# Patient Record
Sex: Female | Born: 1940 | Race: White | Hispanic: No | Smoking: Former smoker
Health system: Southern US, Community
[De-identification: ages and names within clinical notes are randomized; demographics above are authoritative.]

## PROBLEM LIST (undated history)

## (undated) DIAGNOSIS — E039 Hypothyroidism, unspecified: Secondary | ICD-10-CM

## (undated) DIAGNOSIS — F419 Anxiety disorder, unspecified: Secondary | ICD-10-CM

## (undated) DIAGNOSIS — M8580 Other specified disorders of bone density and structure, unspecified site: Secondary | ICD-10-CM

## (undated) DIAGNOSIS — F329 Major depressive disorder, single episode, unspecified: Secondary | ICD-10-CM

## (undated) DIAGNOSIS — J449 Chronic obstructive pulmonary disease, unspecified: Secondary | ICD-10-CM

## (undated) HISTORY — PX: NASAL SEPTUM SURGERY: SHX37

## (undated) HISTORY — DX: Anxiety disorder, unspecified: F41.9

## (undated) HISTORY — PX: APPENDECTOMY: SHX54

## (undated) HISTORY — DX: Chronic obstructive pulmonary disease, unspecified: J44.9

## (undated) HISTORY — PX: CARPAL TUNNEL RELEASE: SHX101

## (undated) HISTORY — DX: Major depressive disorder, single episode, unspecified: F32.9

## (undated) HISTORY — DX: Hypothyroidism, unspecified: E03.9

## (undated) HISTORY — PX: THYROIDECTOMY: SHX17

## (undated) HISTORY — DX: Other specified disorders of bone density and structure, unspecified site: M85.80

---

## 1998-03-01 ENCOUNTER — Other Ambulatory Visit: Admission: RE | Admit: 1998-03-01 | Discharge: 1998-03-01 | Payer: Self-pay | Admitting: Obstetrics and Gynecology

## 1999-06-21 ENCOUNTER — Other Ambulatory Visit: Admission: RE | Admit: 1999-06-21 | Discharge: 1999-06-21 | Payer: Self-pay | Admitting: Obstetrics and Gynecology

## 2000-06-05 ENCOUNTER — Other Ambulatory Visit: Admission: RE | Admit: 2000-06-05 | Discharge: 2000-06-05 | Payer: Self-pay | Admitting: Obstetrics and Gynecology

## 2000-06-17 ENCOUNTER — Encounter: Admission: RE | Admit: 2000-06-17 | Discharge: 2000-06-17 | Payer: Self-pay | Admitting: Obstetrics and Gynecology

## 2000-06-17 ENCOUNTER — Encounter: Payer: Self-pay | Admitting: Obstetrics and Gynecology

## 2001-06-24 ENCOUNTER — Other Ambulatory Visit: Admission: RE | Admit: 2001-06-24 | Discharge: 2001-06-24 | Payer: Self-pay | Admitting: Family Medicine

## 2001-07-30 LAB — HM COLONOSCOPY

## 2002-07-01 ENCOUNTER — Encounter: Admission: RE | Admit: 2002-07-01 | Discharge: 2002-07-01 | Payer: Self-pay | Admitting: Internal Medicine

## 2002-07-01 ENCOUNTER — Encounter: Payer: Self-pay | Admitting: Internal Medicine

## 2002-07-07 ENCOUNTER — Encounter: Admission: RE | Admit: 2002-07-07 | Discharge: 2002-07-07 | Payer: Self-pay | Admitting: Family Medicine

## 2002-07-07 ENCOUNTER — Encounter: Payer: Self-pay | Admitting: Family Medicine

## 2002-09-01 ENCOUNTER — Encounter: Admission: RE | Admit: 2002-09-01 | Discharge: 2002-09-01 | Payer: Self-pay | Admitting: Internal Medicine

## 2002-09-01 ENCOUNTER — Encounter: Payer: Self-pay | Admitting: Internal Medicine

## 2002-10-13 ENCOUNTER — Other Ambulatory Visit: Admission: RE | Admit: 2002-10-13 | Discharge: 2002-10-13 | Payer: Self-pay | Admitting: Internal Medicine

## 2003-01-05 ENCOUNTER — Ambulatory Visit (HOSPITAL_BASED_OUTPATIENT_CLINIC_OR_DEPARTMENT_OTHER): Admission: RE | Admit: 2003-01-05 | Discharge: 2003-01-05 | Payer: Self-pay | Admitting: Orthopedic Surgery

## 2003-03-10 ENCOUNTER — Ambulatory Visit (HOSPITAL_BASED_OUTPATIENT_CLINIC_OR_DEPARTMENT_OTHER): Admission: RE | Admit: 2003-03-10 | Discharge: 2003-03-10 | Payer: Self-pay | Admitting: Orthopedic Surgery

## 2003-11-18 ENCOUNTER — Other Ambulatory Visit: Admission: RE | Admit: 2003-11-18 | Discharge: 2003-11-18 | Payer: Self-pay | Admitting: Internal Medicine

## 2004-05-14 ENCOUNTER — Ambulatory Visit: Payer: Self-pay | Admitting: Internal Medicine

## 2004-06-11 ENCOUNTER — Ambulatory Visit: Payer: Self-pay | Admitting: Internal Medicine

## 2005-01-15 ENCOUNTER — Ambulatory Visit: Payer: Self-pay | Admitting: Internal Medicine

## 2005-01-15 ENCOUNTER — Other Ambulatory Visit: Admission: RE | Admit: 2005-01-15 | Discharge: 2005-01-15 | Payer: Self-pay | Admitting: Internal Medicine

## 2005-01-22 ENCOUNTER — Ambulatory Visit: Payer: Self-pay | Admitting: Internal Medicine

## 2005-02-19 ENCOUNTER — Ambulatory Visit: Payer: Self-pay | Admitting: Internal Medicine

## 2005-03-14 ENCOUNTER — Ambulatory Visit: Payer: Self-pay | Admitting: Internal Medicine

## 2006-01-29 ENCOUNTER — Other Ambulatory Visit: Admission: RE | Admit: 2006-01-29 | Discharge: 2006-01-29 | Payer: Self-pay | Admitting: Internal Medicine

## 2006-01-29 ENCOUNTER — Ambulatory Visit: Payer: Self-pay | Admitting: Internal Medicine

## 2006-01-29 ENCOUNTER — Encounter: Payer: Self-pay | Admitting: Internal Medicine

## 2006-02-19 ENCOUNTER — Ambulatory Visit: Payer: Self-pay | Admitting: Internal Medicine

## 2006-02-25 ENCOUNTER — Ambulatory Visit: Payer: Self-pay | Admitting: Internal Medicine

## 2006-02-25 LAB — CONVERTED CEMR LAB
Chol/HDL Ratio, serum: 5.5
Cholesterol: 223 mg/dL (ref 0–200)
HCT: 41.1 % (ref 36.0–46.0)
HDL: 40.6 mg/dL (ref >39.0)
Hemoglobin: 13.9 g/dL (ref 12.0–15.0)
LDL DIRECT: 156.8 mg/dL
MCHC: 33.9 g/dL (ref 30.0–36.0)
MCV: 93.5 fL (ref 78.0–100.0)
Platelets: 248 10*3/uL (ref 150–400)
RBC: 4.39 M/uL (ref 3.87–5.11)
RDW: 13.2 % (ref 11.5–14.6)
TSH: 1.08 microintl units/mL (ref 0.35–5.50)
Triglyceride fasting, serum: 83 mg/dL (ref 0–149)
VLDL: 17 mg/dL (ref 0–40)
WBC: 7 10*3/uL (ref 4.5–10.5)

## 2006-10-07 ENCOUNTER — Telehealth: Payer: Self-pay | Admitting: Internal Medicine

## 2007-02-10 ENCOUNTER — Ambulatory Visit: Payer: Self-pay | Admitting: Internal Medicine

## 2007-02-10 ENCOUNTER — Other Ambulatory Visit: Admission: RE | Admit: 2007-02-10 | Discharge: 2007-02-10 | Payer: Self-pay | Admitting: Internal Medicine

## 2007-02-10 ENCOUNTER — Encounter: Payer: Self-pay | Admitting: Internal Medicine

## 2007-02-10 DIAGNOSIS — F419 Anxiety disorder, unspecified: Secondary | ICD-10-CM

## 2007-02-10 DIAGNOSIS — E039 Hypothyroidism, unspecified: Secondary | ICD-10-CM | POA: Insufficient documentation

## 2007-02-10 DIAGNOSIS — J309 Allergic rhinitis, unspecified: Secondary | ICD-10-CM | POA: Insufficient documentation

## 2007-02-10 DIAGNOSIS — F329 Major depressive disorder, single episode, unspecified: Secondary | ICD-10-CM

## 2007-02-10 DIAGNOSIS — Z87448 Personal history of other diseases of urinary system: Secondary | ICD-10-CM

## 2007-02-10 DIAGNOSIS — F32A Depression, unspecified: Secondary | ICD-10-CM

## 2007-02-10 HISTORY — DX: Depression, unspecified: F32.A

## 2007-02-10 HISTORY — DX: Anxiety disorder, unspecified: F41.9

## 2007-02-10 HISTORY — DX: Allergic rhinitis, unspecified: J30.9

## 2007-02-11 LAB — CONVERTED CEMR LAB
BUN: 9 mg/dL (ref 6–23)
Basophils Absolute: 0 10*3/uL (ref 0.0–0.1)
Basophils Relative: 0.5 % (ref 0.0–1.0)
CO2: 28 meq/L (ref 19–32)
Calcium: 9.5 mg/dL (ref 8.4–10.5)
Chloride: 106 meq/L (ref 96–112)
Cholesterol: 216 mg/dL (ref 0–200)
Creatinine, Ser: 0.6 mg/dL (ref 0.4–1.2)
Direct LDL: 157.5 mg/dL
Eosinophils Absolute: 0.6 10*3/uL (ref 0.0–0.6)
Eosinophils Relative: 8.5 % — ABNORMAL HIGH (ref 0.0–5.0)
GFR calc Af Amer: 129 mL/min
GFR calc non Af Amer: 106 mL/min
Glucose, Bld: 89 mg/dL (ref 70–99)
HCT: 38.7 % (ref 36.0–46.0)
HDL: 45.4 mg/dL (ref >39.0)
Hemoglobin: 13.2 g/dL (ref 12.0–15.0)
Lymphocytes Relative: 22.6 % (ref 12.0–46.0)
MCHC: 34.2 g/dL (ref 30.0–36.0)
MCV: 93.6 fL (ref 78.0–100.0)
Monocytes Absolute: 0.5 10*3/uL (ref 0.2–0.7)
Monocytes Relative: 7.2 % (ref 3.0–11.0)
Neutro Abs: 4.1 10*3/uL (ref 1.4–7.7)
Neutrophils Relative %: 61.2 % (ref 43.0–77.0)
Platelets: 247 10*3/uL (ref 150–400)
Potassium: 4.3 meq/L (ref 3.5–5.1)
RBC: 4.14 M/uL (ref 3.87–5.11)
RDW: 13.6 % (ref 11.5–14.6)
Sodium: 141 meq/L (ref 135–145)
TSH: 2.38 microintl units/mL (ref 0.35–5.50)
Total CHOL/HDL Ratio: 4.8
Triglycerides: 68 mg/dL (ref 0–149)
VLDL: 14 mg/dL (ref 0–40)
WBC: 6.7 10*3/uL (ref 4.5–10.5)

## 2007-02-13 ENCOUNTER — Encounter (INDEPENDENT_AMBULATORY_CARE_PROVIDER_SITE_OTHER): Payer: Self-pay | Admitting: *Deleted

## 2007-02-18 ENCOUNTER — Encounter: Payer: Self-pay | Admitting: Internal Medicine

## 2007-02-27 ENCOUNTER — Ambulatory Visit: Payer: Self-pay | Admitting: Internal Medicine

## 2007-02-27 ENCOUNTER — Encounter: Payer: Self-pay | Admitting: Internal Medicine

## 2007-05-22 ENCOUNTER — Telehealth (INDEPENDENT_AMBULATORY_CARE_PROVIDER_SITE_OTHER): Payer: Self-pay | Admitting: *Deleted

## 2007-08-24 ENCOUNTER — Telehealth: Payer: Self-pay | Admitting: Internal Medicine

## 2008-02-17 ENCOUNTER — Other Ambulatory Visit: Admission: RE | Admit: 2008-02-17 | Discharge: 2008-02-17 | Payer: Self-pay | Admitting: Internal Medicine

## 2008-02-17 ENCOUNTER — Encounter: Payer: Self-pay | Admitting: Internal Medicine

## 2008-02-17 ENCOUNTER — Ambulatory Visit: Payer: Self-pay | Admitting: Internal Medicine

## 2008-02-17 DIAGNOSIS — M899 Disorder of bone, unspecified: Secondary | ICD-10-CM | POA: Insufficient documentation

## 2008-02-17 DIAGNOSIS — M949 Disorder of cartilage, unspecified: Secondary | ICD-10-CM

## 2008-02-17 LAB — CONVERTED CEMR LAB
Bilirubin Urine: NEGATIVE
Blood in Urine, dipstick: NEGATIVE
Glucose, Urine, Semiquant: NEGATIVE
Ketones, urine, test strip: NEGATIVE
Nitrite: NEGATIVE
Protein, U semiquant: NEGATIVE
Specific Gravity, Urine: 1.01
Urobilinogen, UA: 0.2
pH: 7

## 2008-02-18 ENCOUNTER — Encounter: Payer: Self-pay | Admitting: Internal Medicine

## 2008-02-22 ENCOUNTER — Telehealth: Payer: Self-pay | Admitting: Internal Medicine

## 2008-02-22 LAB — CONVERTED CEMR LAB
CO2: 29 meq/L (ref 19–32)
Calcium: 9.3 mg/dL (ref 8.4–10.5)
Cholesterol: 241 mg/dL (ref 0–200)
Direct LDL: 162.8 mg/dL
Glucose, Bld: 86 mg/dL (ref 70–99)
Sodium: 140 meq/L (ref 135–145)
Triglycerides: 78 mg/dL (ref 0–149)
Vit D, 1,25-Dihydroxy: 31 (ref 30–89)

## 2008-03-17 ENCOUNTER — Telehealth (INDEPENDENT_AMBULATORY_CARE_PROVIDER_SITE_OTHER): Payer: Self-pay | Admitting: *Deleted

## 2008-03-30 ENCOUNTER — Encounter: Payer: Self-pay | Admitting: Internal Medicine

## 2008-03-30 LAB — HM MAMMOGRAPHY: HM Mammogram: NORMAL

## 2008-04-25 ENCOUNTER — Telehealth (INDEPENDENT_AMBULATORY_CARE_PROVIDER_SITE_OTHER): Payer: Self-pay | Admitting: *Deleted

## 2008-04-25 ENCOUNTER — Encounter (INDEPENDENT_AMBULATORY_CARE_PROVIDER_SITE_OTHER): Payer: Self-pay | Admitting: *Deleted

## 2008-09-19 ENCOUNTER — Telehealth (INDEPENDENT_AMBULATORY_CARE_PROVIDER_SITE_OTHER): Payer: Self-pay | Admitting: *Deleted

## 2009-02-17 ENCOUNTER — Ambulatory Visit: Payer: Self-pay | Admitting: Internal Medicine

## 2009-02-17 DIAGNOSIS — E785 Hyperlipidemia, unspecified: Secondary | ICD-10-CM

## 2009-02-17 DIAGNOSIS — F17200 Nicotine dependence, unspecified, uncomplicated: Secondary | ICD-10-CM | POA: Insufficient documentation

## 2009-02-17 HISTORY — DX: Hyperlipidemia, unspecified: E78.5

## 2009-02-20 ENCOUNTER — Encounter (INDEPENDENT_AMBULATORY_CARE_PROVIDER_SITE_OTHER): Payer: Self-pay | Admitting: *Deleted

## 2009-02-22 ENCOUNTER — Telehealth (INDEPENDENT_AMBULATORY_CARE_PROVIDER_SITE_OTHER): Payer: Self-pay | Admitting: *Deleted

## 2009-02-22 LAB — CONVERTED CEMR LAB
Basophils Relative: 1 % (ref 0–1)
CO2: 27 meq/L (ref 19–32)
Chloride: 106 meq/L (ref 96–112)
Glucose, Bld: 88 mg/dL (ref 70–99)
Hemoglobin: 13.3 g/dL (ref 12.0–15.0)
LDL Cholesterol: 148 mg/dL — ABNORMAL HIGH (ref 0–99)
Lymphocytes Relative: 33 % (ref 12–46)
Lymphs Abs: 2.3 10*3/uL (ref 0.7–4.0)
MCHC: 32.2 g/dL (ref 30.0–36.0)
Monocytes Absolute: 0.5 10*3/uL (ref 0.1–1.0)
Monocytes Relative: 7 % (ref 3–12)
Neutro Abs: 3.9 10*3/uL (ref 1.7–7.7)
Potassium: 4 meq/L (ref 3.5–5.3)
RBC: 4.5 M/uL (ref 3.87–5.11)
Sodium: 141 meq/L (ref 135–145)
TSH: 0.649 microintl units/mL (ref 0.350–4.500)
Total CHOL/HDL Ratio: 4.7
VLDL: 19 mg/dL (ref 0–40)
Vit D, 25-Hydroxy: 29 ng/mL — ABNORMAL LOW (ref 30–89)

## 2009-02-28 ENCOUNTER — Ambulatory Visit: Payer: Self-pay | Admitting: Internal Medicine

## 2009-03-02 ENCOUNTER — Encounter (INDEPENDENT_AMBULATORY_CARE_PROVIDER_SITE_OTHER): Payer: Self-pay | Admitting: *Deleted

## 2009-03-02 ENCOUNTER — Encounter: Payer: Self-pay | Admitting: Internal Medicine

## 2009-03-02 ENCOUNTER — Ambulatory Visit: Payer: Self-pay | Admitting: Internal Medicine

## 2009-03-20 ENCOUNTER — Encounter (INDEPENDENT_AMBULATORY_CARE_PROVIDER_SITE_OTHER): Payer: Self-pay | Admitting: *Deleted

## 2009-04-12 ENCOUNTER — Ambulatory Visit: Payer: Self-pay | Admitting: Internal Medicine

## 2009-04-25 ENCOUNTER — Encounter: Payer: Self-pay | Admitting: Internal Medicine

## 2009-05-26 ENCOUNTER — Ambulatory Visit: Payer: Self-pay | Admitting: Internal Medicine

## 2009-06-14 ENCOUNTER — Telehealth (INDEPENDENT_AMBULATORY_CARE_PROVIDER_SITE_OTHER): Payer: Self-pay | Admitting: *Deleted

## 2009-08-09 ENCOUNTER — Encounter (INDEPENDENT_AMBULATORY_CARE_PROVIDER_SITE_OTHER): Payer: Self-pay | Admitting: *Deleted

## 2009-08-10 ENCOUNTER — Telehealth: Payer: Self-pay | Admitting: Internal Medicine

## 2009-08-10 ENCOUNTER — Ambulatory Visit: Payer: Self-pay | Admitting: Family Medicine

## 2009-08-15 ENCOUNTER — Telehealth (INDEPENDENT_AMBULATORY_CARE_PROVIDER_SITE_OTHER): Payer: Self-pay | Admitting: *Deleted

## 2009-08-21 ENCOUNTER — Ambulatory Visit: Payer: Self-pay | Admitting: Internal Medicine

## 2009-08-21 DIAGNOSIS — L821 Other seborrheic keratosis: Secondary | ICD-10-CM | POA: Insufficient documentation

## 2009-08-29 ENCOUNTER — Ambulatory Visit: Payer: Self-pay | Admitting: Internal Medicine

## 2009-08-31 ENCOUNTER — Telehealth (INDEPENDENT_AMBULATORY_CARE_PROVIDER_SITE_OTHER): Payer: Self-pay | Admitting: *Deleted

## 2009-09-06 ENCOUNTER — Ambulatory Visit: Payer: Self-pay | Admitting: Internal Medicine

## 2009-09-06 DIAGNOSIS — R0609 Other forms of dyspnea: Secondary | ICD-10-CM | POA: Insufficient documentation

## 2009-09-06 DIAGNOSIS — R0989 Other specified symptoms and signs involving the circulatory and respiratory systems: Secondary | ICD-10-CM

## 2009-09-07 ENCOUNTER — Encounter: Payer: Self-pay | Admitting: Internal Medicine

## 2009-12-12 ENCOUNTER — Ambulatory Visit: Payer: Self-pay | Admitting: Internal Medicine

## 2009-12-19 ENCOUNTER — Ambulatory Visit: Payer: Self-pay | Admitting: Internal Medicine

## 2010-01-17 ENCOUNTER — Ambulatory Visit: Payer: Self-pay | Admitting: Internal Medicine

## 2010-05-09 ENCOUNTER — Encounter: Payer: Self-pay | Admitting: Internal Medicine

## 2010-05-31 NOTE — Progress Notes (Signed)
  Phone Note Call from Patient Call back at Home Phone [phone]   Details for Reason: PATIENT LEFT MESSAGE ON MACHINE  -- PT STATES THAT SHE THINKS SHE NEEDS A CXR  Summary of Call: left message on machine for pt to return call Meagan Holmes  August 15, 2009 11:44 AM  ov scheduled  Meagan Holmes  August 16, 2009 1:41 PM

## 2010-05-31 NOTE — Assessment & Plan Note (Signed)
Summary: flu shot//ph   Nurse Visit   Allergies: 1)  ! Prednisone  Orders Added: 1)  Flu Vaccine 59yrs + MEDICARE PATIENTS [Q2039] 2)  Administration Flu vaccine - MCR [G0008]   Flu Vaccine Consent Questions     Do you have a history of severe allergic reactions to this vaccine? no    Any prior history of allergic reactions to egg and/or gelatin? no    Do you have a sensitivity to the preservative Thimersol? no    Do you have a past history of Guillan-Barre Syndrome? no    Do you currently have an acute febrile illness? no    Have you ever had a severe reaction to latex? no    Vaccine information given and explained to patient? yes    Are you currently pregnant? no    Lot Number:[id number]   Exp Date:10/27/2010   Site Given  Right Deltoid IM

## 2010-05-31 NOTE — Miscellaneous (Signed)
Summary: Orders Update pft charges   Clinical Lists Changes  Problems: Added new problem of DYSPNEA ON EXERTION (ICD-786.09) Orders: Added new Service order of Carbon Monoxide diffusing w/capacity [phone]) - Signed Added new Service order of Lung Volumes (60454) - Signed Added new Service order of Spirometry (Pre & Post) [phone]) - Signed

## 2010-05-31 NOTE — Assessment & Plan Note (Signed)
Summary: rov/swh   Vital Signs:  Patient profile:   70 year old female Height:      63.75 inches Weight:      141 pounds BMI:     24.48 Pulse rate:   84 / minute BP sitting:   100 / 60  Vitals Entered By: Shary Decamp (August 21, 2009 4:11 PM) CC: rov, has ? about vit d, D was low 10/10 & she took 12 weeks of 50,000   History of Present Illness: ROV --osteopenia: h/o of low vitamin D, status-post ergocalciferol. good compliance with Actonel --tobacco abuse wonders about the need of a chest x-ray or  PFTs  we talk about doing PFTs the last time I saw her with bronchitis . She continued to smoke about one pack a day, she was able to quit for 6 weeks w/ the use of nicotine patches  but she had to stop the nicotine patch due to side effects ( weird dreams) --has a mole in the back, not sure if it  has growth in size lately  --Hypothyroidism, needs labs     Current Medications (verified): 1)  Calcium 500 Mg Tabs (Calcium) 2)  Synthroid 88 Mcg Tabs (Levothyroxine Sodium) .[name].. 1 By Mouth Once Daily 3)  Aspirin  Tabs (Aspirin Tabs) 4)  Ativan 0.5 Mg  Tabs (Lorazepam) .... One By Mouth Every 6 Hours As Needed For Anxiety and Insomnia 5)  Actonel 150 Mg Tabs (Risedronate Sodium) .[name].. 1 By Mouth Q Month 6)  Vitamin C 7)  Ventolin Hfa 108 (90 Base) Mcg/act Aers (Albuterol Sulfate) .... Two Puffs Every 6 Hours As Needed For Cough and Wheezing  Allergies (verified): 1)  ! Prednisone  Past History:  Past Medical History: Allergic rhinitis-a lot better after septoplasty (remote) Hypothyroidism Depression Osteopenia HEMATURIA, MICROSCOPIC, HX OF -- 2004:  (-) CT abd--pelvis (-) cystoscopy  Review of Systems General:  other than the HPI concerns feels well .  Physical Exam  General:  alert and well-developed.   Lungs:  normal respiratory effort and no intercostal retractions.  mild large airway congestion, no crackles or wheezing Heart:  normal rate, regular rhythm, no murmur,  and no gallop.   Extremities:  no edema Skin:  at the mid back, left side has a 1 cm oval skin lesion consistent with seborrheic Keratosis    Impression & Recommendations:  Problem # 1:  TOBACCO ABUSE (ICD-305.1)  consulate about tobacco chest x-ray PFTs  Orders: T-2 View CXR (71020TC) Pulmonary Referral (Pulmonary)  Problem # 2:  OSTEOPENIA (ICD-733.90) status-post ergocalciferol recommend calcium and vitamin D daily labs Her updated medication list for this problem includes:    Calcium 500 Mg Tabs (Calcium)    Actonel 150 Mg Tabs (Risedronate sodium) .[name]... 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 1 by mouth once daily  Labs Reviewed: TSH: 0.649 (02/17/2009)    Chol: 212 (02/17/2009)   HDL: 45 (02/17/2009)   LDL: 148 (02/17/2009)   TG: 97 (02/17/2009)  Complete Medication List: 1)  Calcium 500 Mg Tabs (Calcium) 2)  Synthroid 88 Mcg Tabs (Levothyroxine sodium) .[name].. 1 by mouth once daily 3)  Aspirin Tabs (Aspirin tabs) 4)  Ativan 0.5  Mg Tabs (Lorazepam) .... One by mouth every 6 hours as needed for anxiety and insomnia 5)  Actonel 150 Mg Tabs (Risedronate sodium) .[name].. 1 by mouth q month 6)  Vitamin C  7)  Ventolin Hfa 108 (90 Base) Mcg/act Aers (Albuterol sulfate) .... Two puffs every 6 hours as needed for cough and wheezing  Patient Instructions: 1)  please come back at your convenience: 2)  TSH--- dx  hypothyroidism 3)  vitamin D ----dx osteopenia

## 2010-05-31 NOTE — Progress Notes (Signed)
Summary: cxr results, d/w pt  Phone Note Outgoing Call   Details for Reason: CXR RESULTS: advise patient: has some scarring and emphysema , likely from tobacco Signed by [facility] E. Paz MD on 08/30/2009 at 5:57 PM Summary of Call: left message on machine for pt to return call Meagan Holmes  Aug 31, 2009 9:12 AM discussed with pt Meagan Holmes  Sep 02, 2009 9:31 AM

## 2010-05-31 NOTE — Assessment & Plan Note (Signed)
Summary: MOLE REMOVAL/KN   Vital Signs:  Patient profile:   70 year old female Weight:      140.50 pounds Pulse rate:   73 / minute Pulse rhythm:   regular BP sitting:   130 / 78  (left arm) Cuff size:   regular  Vitals Entered By: Army Fossa CMA (December 12, 2009 2:15 PM) CC: mole removal off back   History of Present Illness: he has a skin lesion in the left mid to upper back. We agreed to monitor it for changes but she is concerned because she can't see it and she does not think her husband is able to check the lesion well. Request excision. she also has a similar lesion in the left leg  Current Medications (verified): 1)  Calcium 500 Mg Tabs (Calcium) 2)  Synthroid 88 Mcg Tabs (Levothyroxine Sodium) .[name].. 1 By Mouth Once Daily 3)  Aspirin  Tabs (Aspirin Tabs) 4)  Ativan 0.5 Mg  Tabs (Lorazepam) .... One By Mouth Every 6 Hours As Needed For Anxiety and Insomnia 5)  Actonel 150 Mg Tabs (Risedronate Sodium) .[name].. 1 By Mouth Q Month 6)  Ventolin Hfa 108 (90 Base) Mcg/act Aers (Albuterol Sulfate) .... Two Puffs Every 6 Hours As Needed For Cough and Wheezing  Allergies: 1)  ! Prednisone  Physical Exam  General:  alert and well-developed.   Skin:  at the mid back, left side has a 1 cm oval skin lesion consistent with seborrheic Keratosis  similar but less dark lesion at  left inner thigh close to the groin.   Impression & Recommendations:  Problem # 1:  procedure note in a sterile fashion and under local anesthesia with lidocaine 1% without approximately 2 cc we did A. elliptical 2 cm incision and remove the skin lesion from the left mid to upper back. 3 stitches Patient instructed to come back in one week for stitch removal pathology sent Will call if redness, discharge or severe bleeding develops  Problem # 2:  skin lesion and the left leg likely seborrheic or ptosis, will recommend observation for now since the patient is able to check it herself  Complete  Medication List: 1)  Calcium 500 Mg Tabs (Calcium) 2)  Synthroid 88 Mcg Tabs (Levothyroxine sodium) .[name].. 1 by mouth once daily 3)  Aspirin Tabs (Aspirin tabs) 4)  Ativan 0.5 Mg Tabs (Lorazepam) .... One by mouth every 6 hours as needed for anxiety and insomnia 5)  Actonel 150 Mg Tabs (Risedronate sodium) .[name].. 1 by mouth q month 6)  Ventolin Hfa 108 (90 Base) Mcg/act Aers (Albuterol sulfate) .... Two puffs every 6 hours as needed for cough and wheezing  Other Orders: Excise lesion (TAL) 0.6 - 1.0 (11401)

## 2010-05-31 NOTE — Letter (Signed)
Summary: [facility] No Show Letter  [facility] [place]  [address] [place], [place] [address]   Phone: [phone]  Fax: [phone]    08/09/2009 MRN: [id number]  [facility] [address] [place], [place]  [address]   Dear Ms. Jean Rosenthal,   Our records indicate that you missed your scheduled appointment with Dr. Drue Novel on 08/09/09.  Please contact this office to reschedule your appointment as soon as possible.  It is important that you keep your scheduled appointments with your physician, so we can provide you the best care possible.  Please be advised that there may be a charge for "no show" appointments.    Sincerely,   [facility]

## 2010-05-31 NOTE — Assessment & Plan Note (Signed)
Summary: bronchtisis/kdc   Vital Signs:  Patient profile:   70 year old female Height:      63.75 inches Weight:      141.2 pounds Temp:     98.3 degrees F BP sitting:   112 / 60  Vitals Entered By: Shary Decamp (May 26, 2009 4:10 PM) CC: acute Comments  - nasal, chest congestion, cough Shary Decamp  May 26, 2009 4:22 PM    History of Present Illness: she was seen at the mid December with bronchitis, she got completely well she got another "cold" a week ago: Developed nose and throat congestion, cough  Current Medications (verified): 1)  Calcium 500 Mg Tabs (Calcium) 2)  Synthroid 88 Mcg Tabs (Levothyroxine Sodium) .[name].. 1 By Mouth Once Daily 3)  Aspirin  Tabs (Aspirin Tabs) 4)  Ativan 0.5 Mg  Tabs (Lorazepam) .... One By Mouth Every 6 Hours As Needed For Anxiety and Insomnia 5)  Actonel 150 Mg Tabs (Risedronate Sodium) .[name].. 1 By Mouth Q Month 6)  Vitamin C  Allergies (verified): 1)  ! Prednisone  Past History:  Past Medical History: Reviewed history from 02/17/2009 and no changes required. Allergic rhinitis-a lot better after septoplasty (remote) Hypothyroidism Depression Osteopenia HEMATURIA, MICROSCOPIC, HX OF -- 2004:  (-) CT abd--pelvis (-) cystoscopy  Past Surgical History: Reviewed history from 02/10/2007 and no changes required. Appendectomy Thyroidectomy CTS surgery Deviated septum surgery  Social History: Reviewed history from 02/17/2009 and no changes required. Married lost  son 2008 (to drugs) 3 daughters Retired smokes 1ppd no ETOH Regular exercise-yes Drug use-no Current Smoker  Review of Systems       denies fever some wheezing  Physical Exam  General:  alert and well-developed.   Ears:  R ear normal and L ear normal.   Nose:  not congested Mouth:  not redness or discharge Lungs:  normal respiratory effort and no intercostal retractions.  rhonchi bilaterally with cough, slightly prolonged expiratory phase without  clear-cut wheezing   Impression & Recommendations:  Problem # 1:  BRONCHITIS- ACUTE (ICD-466.0) bronchitis and some degree of reactive airway disease patient is a heavy smoker see instructions  will order PFTs once she is better   Her updated medication list for this problem includes:    Doxycycline Hyclate 100 Mg Caps (Doxycycline hyclate) .[name]... 1 by mouth twice a day for one week    Ventolin Hfa 108 (90 Base) Mcg/act Aers (Albuterol sulfate) .[name]..[name] Two puffs every 6 hours as needed for cough and wheezing  Complete Medication List: 1)  Calcium 500 Mg Tabs (Calcium) 2)  Synthroid 88 Mcg Tabs (Levothyroxine sodium) .[name].. 1 by mouth once daily 3)  Aspirin Tabs (Aspirin tabs) 4)  Ativan 0.5 Mg Tabs (Lorazepam) .... One by mouth every 6 hours as needed for anxiety and insomnia 5)  Actonel 150 Mg Tabs (Risedronate sodium) .[name].. 1 by mouth q month 6)  Vitamin C  7)  Doxycycline Hyclate 100 Mg Caps (Doxycycline hyclate) .[name].. 1 by mouth twice a day for one week 8)  Ventolin Hfa 108 (90 Base) Mcg/act Aers (Albuterol sulfate) .... Two puffs every 6 hours as needed for cough and wheezing  Patient Instructions: 1)  rest , fluids, tyelnol, mucinex DM for cough 2)  for wheezing and persistent cough use ventolin 3)  doxycycline, antibiotic for one week 4)  call if no better in few days  or if worse  5)  Please schedule a follow-up appointment in 1 month.  Prescriptions: VENTOLIN HFA 108 (  90 BASE) MCG/ACT AERS (ALBUTEROL SULFATE) two puffs every 6 hours as needed for cough and wheezing  #1 x 1   Entered and Authorized by:   Nolon Rod. Paz MD   Signed by:   Nolon Rod. Paz MD on 05/26/2009   Method used:   [facility]        [facility]  [address] [phone][facility] (retail)       [address]       [place], [place]  [id number]       Ph: [phone] or [phone]       Fax: [phone]   RxID:   [id number] DOXYCYCLINE HYCLATE 100 MG CAPS (DOXYCYCLINE HYCLATE) 1 by mouth  twice a day for one week  #14 x 0   Entered and Authorized by:   Nolon Rod. Paz MD   Signed by:   Nolon Rod. Paz MD on 05/26/2009   Method used:   [facility]        [facility]  [address] [phone]* (retail)       [address]       [place], [place]  [id number]       Ph: [phone] or [phone]       Fax: [phone]   RxID:   [id number]

## 2010-05-31 NOTE — Assessment & Plan Note (Signed)
Summary: followup/kn   Vital Signs:  Patient profile:   70 year old female Weight:      141.13 pounds Pulse rate:   63 / minute Pulse rhythm:   regular BP sitting:   122 / 80  (left arm) Cuff size:   regular  Vitals Entered By: Army Fossa CMA (December 19, 2009 1:44 PM) CC: follow up see if stitches are ready to be removed.   Current Medications (verified): 1)  Calcium 500 Mg Tabs (Calcium) 2)  Synthroid 88 Mcg Tabs (Levothyroxine Sodium) .[name].. 1 By Mouth Once Daily 3)  Aspirin  Tabs (Aspirin Tabs) 4)  Ativan 0.5 Mg  Tabs (Lorazepam) .... One By Mouth Every 6 Hours As Needed For Anxiety and Insomnia 5)  Actonel 150 Mg Tabs (Risedronate Sodium) .[name].. 1 By Mouth Q Month 6)  Ventolin Hfa 108 (90 Base) Mcg/act Aers (Albuterol Sulfate) .... Two Puffs Every 6 Hours As Needed For Cough and Wheezing  Allergies: 1)  ! 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 1 by mouth once daily 3)  Aspirin Tabs (Aspirin tabs) 4)  Ativan 0.5 Mg Tabs (Lorazepam) .... One by mouth every 6 hours as needed for anxiety and insomnia 5)  Actonel 150 Mg Tabs (Risedronate sodium) .[name].. 1 by mouth q month 6)  Ventolin Hfa 108 (90 Base) Mcg/act Aers (Albuterol sulfate) .... Two puffs every 6 hours as needed for cough and wheezing

## 2010-05-31 NOTE — Progress Notes (Signed)
Summary: ativan refill  Phone Note Refill Request Message from:  Pharmacy on August 10, 2009 9:21 AM  Refills Requested: Medication #1:  ATIVAN 0.5 MG  TABS one by mouth every 6 hours as needed for anxiety and insomnia   Notes: #180 with 3 refills on 09/20/2008 cpx - 01/2009, due rov, no showed 08/09/09   Method Requested: Fax to [facility] Next Appointment Scheduled: no pending app Initial call taken by: Shary Decamp,  August 10, 2009 9:22 AM  Follow-up for Phone Call        call 180, no RF Meagan Holmes E. Meagan Helbert MD  August 10, 2009 10:24 AM     Prescriptions: ATIVAN 0.5 MG  TABS (LORAZEPAM) one by mouth every 6 hours as needed for anxiety and insomnia  #180 x 0   Entered by:   Shary Decamp   Authorized by:   Nolon Rod. Demitra Danley MD   Signed by:   Shary Decamp on 08/10/2009   Method used:   Printed then faxed to ...       [facility]             , [place]         Ph:        Fax: [phone]   RxID:   [id number]

## 2010-05-31 NOTE — Progress Notes (Signed)
Summary: reaction to Nicotine patch  Phone Note Call from Patient Call back at Home Phone [phone]   Caller: Patient Summary of Call: Pt seen 05/26/09 and has bought otc Nicoderm CQ Patch, since using having symptons not sleeping and having scary weird dreams.  Patch has 2gm of nicotine,   pt wants to know can she cut patch in half? What does Dr Drue Novel recommend, she says she has not smoked. Kandice Hams  June 14, 2009 3:24 PM  Initial call taken by: Kandice Hams,  June 14, 2009 3:24 PM  Follow-up for Phone Call        ok to try half patch Jose E. Paz MD  June 15, 2009 8:16 AM   Left msg for pt OK to try half patch, any questions can call office.Kandice Hams  June 15, 2009 8:39 AM  Follow-up by: Kandice Hams,  June 15, 2009 8:39 AM

## 2010-07-16 ENCOUNTER — Telehealth: Payer: Self-pay | Admitting: Internal Medicine

## 2010-07-18 ENCOUNTER — Other Ambulatory Visit: Payer: Self-pay

## 2010-07-25 ENCOUNTER — Other Ambulatory Visit (INDEPENDENT_AMBULATORY_CARE_PROVIDER_SITE_OTHER): Payer: Medicare Other

## 2010-07-25 DIAGNOSIS — E039 Hypothyroidism, unspecified: Secondary | ICD-10-CM

## 2010-07-26 NOTE — Progress Notes (Signed)
Summary: Levoxyl refill- Please call today--set appt  Phone Note Refill Request Message from:  Fax from Pharmacy on July 16, 2010 9:24 AM  Refills Requested: Medication #1:  LEVOXYL 0.088MG    Notes: [id number] [address] [place], [place], [place]   phone [phone]    fax - [phone]    qty = 90       patient was using mail order  Next Appointment Scheduled: none Initial call taken by: Jerolyn Shin,  July 16, 2010 9:25 AM  Follow-up for Phone Call        ok 90, no RF please arrange: --TSH Dx hypothyroid ( for this week) --CPX within 2 months Follow-up by: Jaylene Arrowood E. Myeasha Ballowe MD,  July 16, 2010 12:56 PM  Additional Follow-up for Phone Call Additional follow up Details #1::        made lab appt for Wednesday 3/21; will check her calender and make CPX appt when she comes on Wednesday.Marland KitchenMarland KitchenMarland KitchenJerolyn Shin  July 16, 2010 4:52 PM    Prescriptions: SYNTHROID 88 MCG TABS (LEVOTHYROXINE SODIUM) 1 by mouth once daily  #90 x 0   Entered by:   Army Fossa CMA   Authorized by:   Nolon Rod. Jennfer Gassen MD   Signed by:   Army Fossa CMA on 07/16/2010   Method used:   [facility]        [facility] [phone]* (retail)       [address]       [place], [place]  [id number]       Ph: [phone] or [phone]       Fax: [phone]   RxID:   [id number]

## 2010-07-27 ENCOUNTER — Telehealth: Payer: Self-pay | Admitting: *Deleted

## 2010-07-27 NOTE — Telephone Encounter (Signed)
Message left for patient to return my call.  

## 2010-07-27 NOTE — Telephone Encounter (Signed)
Message copied by Army Fossa on Fri Jul 27, 2010  1:17 PM ------      Message from: Willow Ora      Created: Fri Jul 27, 2010 12:22 PM       Advise patient:      TSH OK, cont same synthroid

## 2010-07-30 ENCOUNTER — Telehealth: Payer: Self-pay | Admitting: Internal Medicine

## 2010-07-30 NOTE — Telephone Encounter (Signed)
Tried to call pt, memory full.

## 2010-07-30 NOTE — Telephone Encounter (Signed)
Patient called---would like results of recent blood work

## 2010-07-31 NOTE — Telephone Encounter (Signed)
Memory full, unable to leave pt

## 2010-08-01 NOTE — Telephone Encounter (Signed)
Mailed copy to pts

## 2010-08-02 NOTE — Telephone Encounter (Signed)
Pt aware of labs  

## 2010-09-14 NOTE — Op Note (Signed)
   NAME:  Meagan Holmes, Meagan Holmes                         ACCOUNT NO.:  [id number]   MEDICAL RECORD NO.:  [id number]                   PATIENT TYPE:  AMB   LOCATION:  DSC                                  FACILITY:  [facility]   PHYSICIAN:  Cindee Salt, M.D.                    DATE OF BIRTH:  July 22, 1940   DATE OF PROCEDURE:  01/05/2003  DATE OF DISCHARGE:                                 OPERATIVE REPORT   PREOPERATIVE DIAGNOSIS:  Carpal tunnel syndrome, right hand.   POSTOPERATIVE DIAGNOSIS:  Carpal tunnel syndrome, right hand.   OPERATION:  Decompression of right median nerve.   SURGEON:  Cindee Salt, M.D.   ANESTHESIA:  Forearm-based IV regional.   HISTORY:  The patient is a 70 year old female with a history of carpal  tunnel syndrome. EMG nerve conduction positive which has not responded to  conservative treatment. Severe nerve conductions.   PROCEDURE:  The patient was brought to the operating room where a forearm-  based IV regional anesthetic was carried out without difficulty. She was  prepped and draped using Betadine in supine position, right arm free. A  longitudinal incision was made in the palm and carried through subcutaneous  tissue. Bleeders were electrocauterized. Palmar fascia was splint.  Superficial palmar arch identified. The flexor tendon to the ring and little  finger identified to the ulnar side of the medial nerve. The carpal  retinaculum was incised with sharp dissection. A right angle and retractor  placed between skin and forearm fascia. The fascia was released for  approximately 3-cm proximal to the wrist crease under direct vision. Canal  was explored. Tenosynovial tissue was markedly thickened, and a deformity to  the nerve was apparent. The wound was irrigated. The skin was closed with  interrupted 5-0 nylon sutures. Sterile compressive dressing and splint was  applied. The patient tolerated the procedure well. She is discharged home to  return to [facility]  [place] in one week on Vicodin and Keflex.                                               Cindee Salt, M.D.    Angelique Blonder  D:  01/05/2003  T:  01/05/2003  Job:  [id number]

## 2010-09-14 NOTE — Op Note (Signed)
   NAME:  Meagan Holmes, Meagan Holmes                         ACCOUNT NO.:  [id number]   MEDICAL RECORD NO.:  [id number]                   PATIENT TYPE:  AMB   LOCATION:  DSC                                  FACILITY:  [facility]   PHYSICIAN:  Cindee Salt, M.D.                    DATE OF BIRTH:  06/06/1940   DATE OF PROCEDURE:  03/10/2003  DATE OF DISCHARGE:                                 OPERATIVE REPORT   PREOPERATIVE DIAGNOSIS:  Carpal tunnel syndrome, left hand.   POSTOPERATIVE DIAGNOSIS:  Carpal tunnel syndrome, left hand.   PROCEDURE:  Decompression of left median nerve.   SURGEON:  Cindee Salt, M.D.   ANESTHESIA:  Forearm based IV regional.   INDICATIONS FOR PROCEDURE:  The patient is a 70 year old female with a  history of carpal tunnel syndrome.  EMG nerve conductions are positive.  This has not responded to conservative treatment.  She denies release.   DESCRIPTION OF PROCEDURE:  The patient was brought to the operating room  where a forearm based IV regional anesthetic was carried out without  difficulty.  She was prepped using Duraprep in the supine position with the  left arm free.  A longitudinal incision was made in the palm and carried  down through subcutaneous tissues.  Bleeders were electrocauterized.  The  palmar fascia was split and the superficial palmar arch identified.  The  flexor tendon to the ring and little finger was identified to the ulnar side  of the median nerve.  The carpal retinaculum was incised with sharp  dissection. A right angle and Sewell retractor were placed between the skin  and forearm fascia.  The fascia was released for approximately 3 cm proximal  to the wrist crease under direct vision. The canal was explored and no  further lesions were identified.  The tenosynovial tissue was markedly  thickened.  The wound was irrigated.  The skin was closed with interrupted 5-  0 nylon sutures. A sterile compressive dressing and splint were applied.  The  patient tolerated the procedure well and was taken to the recovery room  for observation in satisfactory condition.  She is discharged home to return  to the [facility] in one week on Vicodin and Keflex.                                               Cindee Salt, M.D.    GK/MEDQ  D:  03/10/2003  T:  03/10/2003  Job:  [id number]

## 2010-10-02 ENCOUNTER — Ambulatory Visit (INDEPENDENT_AMBULATORY_CARE_PROVIDER_SITE_OTHER): Payer: Medicare Other | Admitting: Internal Medicine

## 2010-10-02 ENCOUNTER — Encounter: Payer: Self-pay | Admitting: Internal Medicine

## 2010-10-02 ENCOUNTER — Other Ambulatory Visit (HOSPITAL_COMMUNITY)
Admission: RE | Admit: 2010-10-02 | Discharge: 2010-10-02 | Disposition: A | Discharge status: Home / Self Care | Payer: Medicare Other | Source: Ambulatory Visit | Attending: Internal Medicine | Admitting: Internal Medicine

## 2010-10-02 DIAGNOSIS — Z0000 Encounter for general adult medical examination without abnormal findings: Secondary | ICD-10-CM

## 2010-10-02 DIAGNOSIS — M810 Age-related osteoporosis without current pathological fracture: Secondary | ICD-10-CM | POA: Insufficient documentation

## 2010-10-02 DIAGNOSIS — J449 Chronic obstructive pulmonary disease, unspecified: Secondary | ICD-10-CM

## 2010-10-02 DIAGNOSIS — G4700 Insomnia, unspecified: Secondary | ICD-10-CM

## 2010-10-02 DIAGNOSIS — E039 Hypothyroidism, unspecified: Secondary | ICD-10-CM

## 2010-10-02 DIAGNOSIS — M722 Plantar fascial fibromatosis: Secondary | ICD-10-CM

## 2010-10-02 DIAGNOSIS — E785 Hyperlipidemia, unspecified: Secondary | ICD-10-CM

## 2010-10-02 DIAGNOSIS — Z124 Encounter for screening for malignant neoplasm of cervix: Secondary | ICD-10-CM | POA: Insufficient documentation

## 2010-10-02 DIAGNOSIS — Z0001 Encounter for general adult medical examination with abnormal findings: Secondary | ICD-10-CM | POA: Insufficient documentation

## 2010-10-02 HISTORY — DX: Chronic obstructive pulmonary disease, unspecified: J44.9

## 2010-10-02 HISTORY — DX: Age-related osteoporosis without current pathological fracture: M81.0

## 2010-10-02 LAB — CBC WITH DIFFERENTIAL/PLATELET
Basophils Relative: 0.5 % (ref 0.0–3.0)
Eosinophils Relative: 2.9 % (ref 0.0–5.0)
Hemoglobin: 13.5 g/dL (ref 12.0–15.0)
Lymphocytes Relative: 27.2 % (ref 12.0–46.0)
MCHC: 34.3 g/dL (ref 30.0–36.0)
Monocytes Relative: 7 % (ref 3.0–12.0)
Neutro Abs: 4.7 10*3/uL (ref 1.4–7.7)
Neutrophils Relative %: 62.4 % (ref 43.0–77.0)
RBC: 4.25 Mil/uL (ref 3.87–5.11)
WBC: 7.6 10*3/uL (ref 4.5–10.5)

## 2010-10-02 LAB — BASIC METABOLIC PANEL
BUN: 15 mg/dL (ref 6–23)
Chloride: 105 mEq/L (ref 96–112)
GFR: 97.43 mL/min (ref >60.00)
Potassium: 4.3 mEq/L (ref 3.5–5.1)
Sodium: 141 mEq/L (ref 135–145)

## 2010-10-02 LAB — LIPID PANEL
Total CHOL/HDL Ratio: 4
VLDL: 18.8 mg/dL (ref 0.0–40.0)

## 2010-10-02 NOTE — Assessment & Plan Note (Signed)
Labs

## 2010-10-02 NOTE — Assessment & Plan Note (Addendum)
Td  2008 Pneumonia shot : completed in 2009 zostavax discussed   2008 saw gyn then due to abnormal cervix , Dx w/  nabothian cyst had multiple PAPs before, never abnormal;on a monogamus relationship  x decades. Rec PAP q3 years , she likes to keep it q2 years ----> due for PAP, sent today. Next PAP: discuss in 2014 encouraged SBE MMG 12--2012---neg   had  a colonoscopy in 2003, due for a colonoscopy 2013  Diet-exercise discussed

## 2010-10-02 NOTE — Assessment & Plan Note (Signed)
Stretching discussed

## 2010-10-02 NOTE — Assessment & Plan Note (Signed)
Seems stable, encouraged to quit!

## 2010-10-02 NOTE — Assessment & Plan Note (Signed)
Off actonel x months, see HPI, rec to rerstart

## 2010-10-02 NOTE — Assessment & Plan Note (Signed)
Due for labs

## 2010-10-02 NOTE — Progress Notes (Signed)
Subjective:    Patient ID: Meagan Holmes, female    DOB: 01-17-1941, 70 y.o.   MRN: [id number]  HPI Here for Medicare AWV: 1. Risk factors based on Past M, S, F history: reviewed 2. Physical Activities:  Active at home and in the yard, no exercise per se  3. Depression/mood:  + stress but stays in control, uses ativan sometimes  4. Hearing:  No problems reported or noted  5. ADL's:  Independent  6. Fall Risk: low risk  7. home Safety: does feel safe at home  8. Height, weight, &visual acuity: see VS, eyes cheked yearly, uses glasses sometimes  9. Counseling: provided 10. Labs ordered based on risk factors: if needed  11. Referral Coordination: if needed 12.  Care Plan, see assessment and plan  13.   Cognitive Assessment: cognition and motor wnl   In addition, today we discussed the following: C/o sharp heel pain B, increased w/ walking Hypothyroidism-- good med compliance  Osteoporosis-- off actonel x several months, reason? States was told to d/c it, on vit and Ca COPD--chart review, see a/p, still smokes, planning to quit   Past Medical History  Diagnosis Date  . Allergic rhinitis     a lot better after septoplasty- remote  . Hypothyroidism   . Depression   . Osteopenia   . Hematuria 2004    (-) CT abd-pelvis (-) cystoscopy  . COPD (chronic obstructive pulmonary disease) 10/02/2010   Past Surgical History  Procedure Date  . Appendectomy   . Thyroidectomy   . Carpal tunnel release   . Nasal septum surgery    Family History: breast ca--no colon ca--no MI-- father  MI? ~ 67y/o DM--M - late onset HTN - M M - living, HTN, DM (age 68)    Social History: Married lost  son 2008 (to drugs) 3 daughters Retired smokes  1/2 ppd no ETOH   Review of Systems  Constitutional: Negative for fever, fatigue and unexpected weight change.  Respiratory: Negative for cough, shortness of breath and wheezing.   Cardiovascular: Negative for chest pain and leg swelling.    Gastrointestinal: Negative for nausea, vomiting, diarrhea and blood in stool.       Occ heartburn depending on diet   Genitourinary: Negative for dysuria, hematuria, vaginal bleeding and vaginal discharge.       SBE-- not frequently        Objective:   Physical Exam  Constitutional: She is oriented to person, place, and time. She appears well-developed and well-nourished. No distress.  HENT:  Head: Normocephalic and atraumatic.  Neck: No thyromegaly present.       Normal carotid  pulse   Cardiovascular: Normal rate, regular rhythm and normal heart sounds.   No murmur heard. Pulmonary/Chest: Effort normal. No respiratory distress. She has no wheezes. She has no rales.  Abdominal: Soft. She exhibits no distension. There is no tenderness. There is no rebound and no guarding.  Genitourinary:       Breast exam normal GU: external exam norma Vagina w/o lesions or d/c Cervix normal to inspection and palpation Bimanual exam neg   Musculoskeletal: She exhibits no edema.       No TTP @ heels   Neurological: She is alert and oriented to person, place, and time.  Skin: She is not diaphoretic.  Psychiatric: She has a normal mood and affect. Her behavior is normal. Judgment and thought content normal.          Assessment & Plan:

## 2010-10-03 ENCOUNTER — Other Ambulatory Visit: Payer: Self-pay | Admitting: Internal Medicine

## 2010-10-03 LAB — VITAMIN D 25 HYDROXY (VIT D DEFICIENCY, FRACTURES): Vit D, 25-Hydroxy: 60 ng/mL (ref 30–89)

## 2010-10-03 MED ORDER — RISEDRONATE SODIUM 150 MG PO TABS: 150.0000 mg | ORAL_TABLET | ORAL | Status: DC

## 2010-10-03 NOTE — Telephone Encounter (Signed)
meds sent in

## 2010-10-08 ENCOUNTER — Other Ambulatory Visit: Payer: Self-pay | Admitting: Internal Medicine

## 2010-10-08 ENCOUNTER — Telehealth: Payer: Self-pay | Admitting: *Deleted

## 2010-10-08 MED ORDER — RISEDRONATE SODIUM 150 MG PO TABS: 150.0000 mg | ORAL_TABLET | ORAL | Status: DC

## 2010-10-08 NOTE — Telephone Encounter (Signed)
Message left for patient to return my call.  

## 2010-10-08 NOTE — Telephone Encounter (Signed)
meds sent in

## 2010-10-08 NOTE — Telephone Encounter (Signed)
Message copied by Leanne Lovely on Mon Oct 08, 2010  2:56 PM ------      Message from: Wanda Plump      Created: Sun Oct 07, 2010 12:15 PM       Advise patient:      PAP wnl

## 2010-10-09 NOTE — Telephone Encounter (Signed)
Pt is aware.  

## 2010-10-18 ENCOUNTER — Other Ambulatory Visit: Payer: Self-pay | Admitting: Internal Medicine

## 2010-11-12 ENCOUNTER — Telehealth: Payer: Self-pay | Admitting: *Deleted

## 2010-11-12 NOTE — Telephone Encounter (Signed)
Pt left message noting that recent her vitamin d was normal so she would like to know if she should continue the Actonel or should she stop it since the vitamin d has reached normal limits. Please advise.

## 2010-11-12 NOTE — Telephone Encounter (Signed)
Pt is aware.  

## 2010-11-12 NOTE — Telephone Encounter (Signed)
Continue Actonel. Continue vitamin D over-the-counter between 600 to 1000 units daily

## 2011-02-13 ENCOUNTER — Ambulatory Visit (INDEPENDENT_AMBULATORY_CARE_PROVIDER_SITE_OTHER): Payer: Medicare Other

## 2011-02-13 DIAGNOSIS — Z23 Encounter for immunization: Secondary | ICD-10-CM

## 2011-04-01 ENCOUNTER — Encounter: Payer: Self-pay | Admitting: Internal Medicine

## 2011-04-02 ENCOUNTER — Ambulatory Visit: Payer: Medicare Other | Admitting: Internal Medicine

## 2011-04-02 DIAGNOSIS — Z0289 Encounter for other administrative examinations: Secondary | ICD-10-CM

## 2011-05-24 ENCOUNTER — Ambulatory Visit: Payer: Medicare Other | Admitting: Internal Medicine

## 2011-05-27 ENCOUNTER — Encounter: Payer: Self-pay | Admitting: Internal Medicine

## 2011-05-27 ENCOUNTER — Ambulatory Visit (INDEPENDENT_AMBULATORY_CARE_PROVIDER_SITE_OTHER): Payer: Medicare Other | Admitting: Internal Medicine

## 2011-05-27 VITALS — BP 128/78 | HR 64 | Temp 97.7°F | Wt 144.0 lb

## 2011-05-27 DIAGNOSIS — L989 Disorder of the skin and subcutaneous tissue, unspecified: Secondary | ICD-10-CM

## 2011-05-27 DIAGNOSIS — F3289 Other specified depressive episodes: Secondary | ICD-10-CM

## 2011-05-27 DIAGNOSIS — M79609 Pain in unspecified limb: Secondary | ICD-10-CM | POA: Insufficient documentation

## 2011-05-27 DIAGNOSIS — F329 Major depressive disorder, single episode, unspecified: Secondary | ICD-10-CM

## 2011-05-27 DIAGNOSIS — E039 Hypothyroidism, unspecified: Secondary | ICD-10-CM

## 2011-05-27 MED ORDER — ESCITALOPRAM OXALATE 10 MG PO TABS: 10.0000 mg | ORAL_TABLET | Freq: Every day | ORAL | Status: DC

## 2011-05-27 NOTE — Patient Instructions (Signed)
Start Lexapro See above visit in counselor Followup in 1 month

## 2011-05-27 NOTE — Assessment & Plan Note (Addendum)
Sx  not likely synovitis or other arthritis. Seems to be myalgias. Plan: Sedimentation rate, total CK, BMP.

## 2011-05-27 NOTE — Assessment & Plan Note (Signed)
Due for labs

## 2011-05-27 NOTE — Assessment & Plan Note (Signed)
reports depression for a while, mostly related to her husband's illness. Previously she had mild anxiety and was taking Ativan as needed, at this point I think she needs something different. Plan: Lexapro. Reassess in 4 weeks. Recommend to see a counselor if possible.

## 2011-05-27 NOTE — Assessment & Plan Note (Signed)
Likely  SK however this lesion has changed in color. Recommend excision. Patient will scheduled.

## 2011-05-27 NOTE — Progress Notes (Signed)
  Subjective:    Patient ID: Meagan Holmes, female    DOB: 08/22/1940, 71 y.o.   MRN: [id number]  HPI Acute visit but has several issues Complaining of aches and pains, in the legs and arms, not in the joints but in the muscle areas. Symptoms are worse at night and they go away as the day goes by. Has started taking a potassium supplement over the counter without much help. Has a skin lesion on the left leg, it initially looked like a tag but now is more dark. Also complaining of depression, her husband has advanced COPD and he is more dependent on her than before. She also takes care of a 59-year-old child  Past Medical History: COPD Allergic rhinitis-a lot better after septoplasty (remote) Hypothyroidism Depression Osteopenia HEMATURIA, MICROSCOPIC, HX OF -- 2004:  (-) CT abd--pelvis (-) cystoscopy   Past Surgical History: Appendectomy Thyroidectomy CTS surgery Deviated septum surgery Social History:  Married  lost son 2008 (to drugs)  3 daughters  Retired  smokes 1/2 ppd  no ETOH    Review of Systems Denies any fevers, headaches or weight loss. No chest pain no claudication No suicidal ideas, not much anxiety, symptoms are mostly depression. Good medication compliance with thyroid medication, due for labs    Objective:   Physical Exam  Constitutional: She is oriented to person, place, and time. She appears well-developed and well-nourished.  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 Palpation of the muscles in the arms and legs without pain.   Neurological: She is alert and oriented to person, place, and time.  Skin: Skin is warm and dry.     Psychiatric:       Emotional at times and tearful but otherwise okay       Assessment & Plan:

## 2011-05-28 ENCOUNTER — Other Ambulatory Visit: Payer: Self-pay | Admitting: Internal Medicine

## 2011-05-28 LAB — BASIC METABOLIC PANEL
BUN: 16 mg/dL (ref 6–23)
CO2: 28 mEq/L (ref 19–32)
Chloride: 105 mEq/L (ref 96–112)
GFR: 75.17 mL/min (ref >60.00)
Glucose, Bld: 86 mg/dL (ref 70–99)
Potassium: 3.9 mEq/L (ref 3.5–5.1)
Sodium: 140 mEq/L (ref 135–145)

## 2011-05-28 LAB — TSH: TSH: 0.75 u[IU]/mL (ref 0.35–5.50)

## 2011-05-28 NOTE — Telephone Encounter (Signed)
Refill done.  

## 2011-05-29 ENCOUNTER — Encounter: Payer: Self-pay | Admitting: Internal Medicine

## 2011-05-30 ENCOUNTER — Encounter: Payer: Self-pay | Admitting: *Deleted

## 2011-06-13 ENCOUNTER — Encounter: Payer: Self-pay | Admitting: Gastroenterology

## 2011-07-03 ENCOUNTER — Ambulatory Visit (INDEPENDENT_AMBULATORY_CARE_PROVIDER_SITE_OTHER): Payer: Medicare Other | Admitting: Internal Medicine

## 2011-07-03 ENCOUNTER — Encounter: Payer: Self-pay | Admitting: Internal Medicine

## 2011-07-03 VITALS — BP 110/70 | HR 53 | Temp 97.6°F | Wt 141.0 lb

## 2011-07-03 DIAGNOSIS — F329 Major depressive disorder, single episode, unspecified: Secondary | ICD-10-CM

## 2011-07-03 DIAGNOSIS — M79609 Pain in unspecified limb: Secondary | ICD-10-CM

## 2011-07-03 DIAGNOSIS — L989 Disorder of the skin and subcutaneous tissue, unspecified: Secondary | ICD-10-CM

## 2011-07-03 MED ORDER — ESCITALOPRAM OXALATE 10 MG PO TABS: 10.0000 mg | ORAL_TABLET | Freq: Every day | ORAL | Status: DC

## 2011-07-03 NOTE — Assessment & Plan Note (Addendum)
Good compliance and tolerance of Lexapro, improving. Good results. Recommend to stay on Lexapro for at least 4 months or until she sees me.

## 2011-07-03 NOTE — Assessment & Plan Note (Signed)
Asked patient to schedule appointment for the excision.

## 2011-07-03 NOTE — Assessment & Plan Note (Signed)
Recent labs normal, symptoms improving, patient believes is improving due to the fact that she is exercising more, I wonders if SSRIs are helping her.

## 2011-07-03 NOTE — Progress Notes (Signed)
  Subjective:    Patient ID: Meagan Holmes, female    DOB: [birth year], 71 y.o.   MRN: [id number]  HPI Followup from previous visit, she was started on Lexapro. Good  compliance and tolerance, her mood has improved significantly. Also wonders about the blood tests done for her aches and pains, and they were negative, patient aware of. Since the last time she started to exercise on her aches and pains have decreased significantly.  Past Medical History:  COPD  Allergic rhinitis-a lot better after septoplasty (remote)  Hypothyroidism  Depression  Osteopenia  HEMATURIA, MICROSCOPIC, HX OF -- 2004: (-) CT abd--pelvis (-) cystoscopy  Past Surgical History:  Appendectomy  Thyroidectomy  CTS surgery  Deviated septum surgery  Social History:  Married  lost son 2008 (to drugs)  3 daughters  Retired  smokes 1/2 ppd  no ETOH   Review of Systems No nausea, vomiting, diarrhea. She's not getting sleepy, not suicidal ideas.     Objective:   Physical Exam Alert, oriented x3, no apparent distress Psych, in good spirits, not obvious anxiety or depression. Coherent and cooperative     Assessment & Plan:

## 2011-07-15 ENCOUNTER — Ambulatory Visit (INDEPENDENT_AMBULATORY_CARE_PROVIDER_SITE_OTHER): Payer: Medicare Other | Admitting: Internal Medicine

## 2011-07-15 VITALS — BP 114/72 | HR 59 | Temp 97.9°F | Wt 143.0 lb

## 2011-07-15 DIAGNOSIS — L989 Disorder of the skin and subcutaneous tissue, unspecified: Secondary | ICD-10-CM

## 2011-07-15 DIAGNOSIS — D485 Neoplasm of uncertain behavior of skin: Secondary | ICD-10-CM

## 2011-07-15 NOTE — Progress Notes (Signed)
  Subjective:    Patient ID: [name], female    DOB: 1940-07-10, 71 y.o.   MRN: [id number]  HPI  Skin lesion removal  Review of Systems     Objective:   Physical Exam        Assessment & Plan:  Procedure note Indication:  color change In a sterile fashion and under localization with lidocaine 2% without, 1 cc, we did a elliptical incision, biopsy was sent for pathology, minimal bleeding, 2 stitches placed. Asked patient to come back in 1 week. to keep the area clean and dry, to call anytime if redness or discharge. Apply pressure if bleeding, call if bleeding persists.  Addendum, path seborreic keratosis, will call pt and let her now

## 2011-07-16 ENCOUNTER — Telehealth: Payer: Self-pay

## 2011-07-16 NOTE — Telephone Encounter (Signed)
Call from patient and she stated she had a mole removed yesterday and she wanted to know if t was ok for her to exercise with the sutures in her leg. Please advise     KP

## 2011-07-16 NOTE — Telephone Encounter (Signed)
Please advise 

## 2011-07-16 NOTE — Telephone Encounter (Signed)
Discussed with pt

## 2011-07-16 NOTE — Telephone Encounter (Signed)
Ok starting tomorrow

## 2011-07-22 ENCOUNTER — Ambulatory Visit (INDEPENDENT_AMBULATORY_CARE_PROVIDER_SITE_OTHER): Payer: Medicare Other | Admitting: Internal Medicine

## 2011-07-22 DIAGNOSIS — L821 Other seborrheic keratosis: Secondary | ICD-10-CM

## 2011-07-22 NOTE — Progress Notes (Signed)
  Subjective:    Patient ID: Meagan Holmes, female    DOB: 1941/02/21, 71 y.o.   MRN: [id number]  HPI Here for stitches removal, reports no problems.   Review of Systems     Objective:   Physical Exam  Surgical wound healing well.      Assessment & Plan:  Stitches removed, okay to use Neosporin. To call if problems. Pathology benign,  discussed with the patient.

## 2011-07-27 ENCOUNTER — Other Ambulatory Visit: Payer: Self-pay | Admitting: Internal Medicine

## 2011-07-29 NOTE — Telephone Encounter (Signed)
Done

## 2011-08-13 ENCOUNTER — Ambulatory Visit (INDEPENDENT_AMBULATORY_CARE_PROVIDER_SITE_OTHER): Payer: Medicare Other | Admitting: Internal Medicine

## 2011-08-13 VITALS — BP 130/76 | HR 64 | Temp 98.0°F | Resp 16 | Ht 63.0 in | Wt 143.4 lb

## 2011-08-13 DIAGNOSIS — L0390 Cellulitis, unspecified: Secondary | ICD-10-CM

## 2011-08-13 DIAGNOSIS — S40259A Superficial foreign body of unspecified shoulder, initial encounter: Secondary | ICD-10-CM

## 2011-08-13 DIAGNOSIS — IMO0002 Reserved for concepts with insufficient information to code with codable children: Secondary | ICD-10-CM

## 2011-08-13 DIAGNOSIS — S1096XA Insect bite of unspecified part of neck, initial encounter: Secondary | ICD-10-CM

## 2011-08-13 MED ORDER — DOXYCYCLINE HYCLATE 100 MG PO TABS: 100.0000 mg | ORAL_TABLET | Freq: Two times a day (BID) | ORAL | Status: AC

## 2011-08-13 NOTE — Progress Notes (Signed)
  Subjective:    Patient ID: Meagan Holmes, female    DOB: 10-23-1940, 71 y.o.   MRN: [id number]  HPI Tic bite 4 days ago l shoulder Pain moderate in severity Swelling increasing for the past 2 days Pulled off part of the tic Thinks some of it may be in skin still Swelling soreness of the left shoulder area No fever, no chills no nausea or vomiting No rash   Review of Systems  Constitutional: Negative.   HENT: Positive for neck pain.   Eyes: Negative.   Respiratory: Negative.   Cardiovascular: Negative.   Gastrointestinal: Negative.   Genitourinary: Negative.   Skin:       Swelling of the left shoulder area near the tic bite with pain  Neurological: Negative.   Hematological: Negative.   Psychiatric/Behavioral: Negative.   All other systems reviewed and are negative.       Objective:   Physical Exam  Nursing note and vitals reviewed. Constitutional: She is oriented to person, place, and time. She appears well-developed and well-nourished.  HENT:  Head: Normocephalic and atraumatic.  Right Ear: External ear normal.  Left Ear: External ear normal.  Nose: Nose normal.  Mouth/Throat: Oropharynx is clear and moist.  Eyes: Conjunctivae and EOM are normal. Pupils are equal, round, and reactive to light.  Neck: Normal range of motion. Neck supple.  Cardiovascular: Normal rate, regular rhythm and normal heart sounds.   Pulmonary/Chest: Effort normal and breath sounds normal.  Abdominal: Soft. Bowel sounds are normal.  Musculoskeletal: Normal range of motion.  Lymphadenopathy:    She has no cervical adenopathy.  Neurological: She is alert and oriented to person, place, and time. She has normal reflexes.  Skin: No rash noted. There is erythema.       Swelling fluctuance to hte left shoulder area with a tic partly embedded in skin  Psychiatric: She has a normal mood and affect. Her behavior is normal. Judgment and thought content normal.          Assessment & Plan:    Imbedded tic abcess Sterile preperation of the skin Procedure note skin infiltrated with 1 percenet lidocaine 1 cc i and d performed head of the tic removed. 4 cc pus drained and cs sent  Wound dressed sterilly Plan doxycycline

## 2011-08-13 NOTE — Patient Instructions (Signed)
Doxycycline wash daily f/up if increased pain swelling or drainage

## 2011-08-16 LAB — WOUND CULTURE
Gram Stain: NONE SEEN
Organism ID, Bacteria: NO GROWTH

## 2011-08-19 ENCOUNTER — Other Ambulatory Visit: Payer: Self-pay | Admitting: Internal Medicine

## 2011-08-20 NOTE — Telephone Encounter (Signed)
OK to refill? Or was the pt only suppose to take med for 3 months? Please advise.

## 2011-08-27 ENCOUNTER — Other Ambulatory Visit: Payer: Self-pay | Admitting: Internal Medicine

## 2011-08-27 NOTE — Telephone Encounter (Signed)
Refill done.  

## 2011-10-04 NOTE — Telephone Encounter (Signed)
Denied, needs to take over-the-counter vitamin D. remove this medication from her list

## 2011-12-02 ENCOUNTER — Telehealth: Payer: Self-pay | Admitting: Internal Medicine

## 2011-12-02 MED ORDER — RISEDRONATE SODIUM 150 MG PO TABS: 150.0000 mg | ORAL_TABLET | ORAL | Status: DC

## 2011-12-02 NOTE — Telephone Encounter (Signed)
Refill: Actonel 150mg  tablet. Take 1 tablet by mouth every 30 days with water on an empty stomach. Nothing by mouth or lie down for 30... Qty 4. Last fill 08-27-11

## 2011-12-02 NOTE — Telephone Encounter (Signed)
Refill done.  

## 2011-12-19 ENCOUNTER — Encounter: Payer: Self-pay | Admitting: Internal Medicine

## 2011-12-19 ENCOUNTER — Ambulatory Visit (INDEPENDENT_AMBULATORY_CARE_PROVIDER_SITE_OTHER): Payer: Medicare Other | Admitting: Internal Medicine

## 2011-12-19 VITALS — BP 122/74 | HR 55 | Temp 98.1°F | Wt 143.0 lb

## 2011-12-19 DIAGNOSIS — F329 Major depressive disorder, single episode, unspecified: Secondary | ICD-10-CM

## 2011-12-19 DIAGNOSIS — M542 Cervicalgia: Secondary | ICD-10-CM | POA: Insufficient documentation

## 2011-12-19 MED ORDER — CYCLOBENZAPRINE HCL 10 MG PO TABS: 10.0000 mg | ORAL_TABLET | Freq: Every evening | ORAL | Status: DC | PRN

## 2011-12-19 NOTE — Assessment & Plan Note (Signed)
One month history of neck pain with some radiation to the right shoulder. She also has a ill-defined arm pain bilaterally, unclear if the arm pain is related to the neck problem. Neurological exam is normal Plan: Conservative treatment with Tylenol or Motrin, also Flexeril. x-ray of the neck. If not better, we'll need further evaluation.

## 2011-12-19 NOTE — Progress Notes (Signed)
  Subjective:    Patient ID: Meagan Holmes, female    DOB: Sep 03, 1940, 71 y.o.   MRN: [id number]  HPI Acute visit One month history of neck pain, located at the base of the cervical spine, some radiation to the right shoulder, does not increase with neck motion actually feels better. Also complained of an ill-defined pain of both arms at night, between the shoulder and elbow. No lower extremity pain. This is also going on for a few weeks. Has taken Tylenol for pain with mild help.  Past Medical History:   COPD   Allergic rhinitis-a lot better after septoplasty (remote)   Hypothyroidism   Depression   Osteopenia   HEMATURIA, MICROSCOPIC, HX OF -- 2004: (-) CT abd--pelvis (-) cystoscopy   Past Surgical History:   Appendectomy   Thyroidectomy   CTS surgery   Deviated septum surgery   Social History:   Married   lost son 2008 (to drugs)   3 daughters   Retired   smokes 1/2 ppd   no ETOH   Review of Systems Denies any recent injury. Denies any tingling in her feet or hands. No bladder or bowel incontinence. Recently was seen with depression, she self discontinue Lexapro, feeling well at this time.     Objective:   Physical Exam General -- alert, well-developed, and overweight appearing. No apparent distress.  Neck --full range of motion, no deformity, nontender to palpation of the cervical spine. Extremities-- no pretibial edema bilaterally ; shoulders full range of motion without problems. Neurologic-- alert & oriented X3, DTRs and strength normal in all extremities. Speech gait --> intact Psych-- Cognition and judgment appear intact. Alert and cooperative with normal attention span and concentration.  not anxious appearing and not depressed appearing.      Assessment & Plan:

## 2011-12-19 NOTE — Assessment & Plan Note (Signed)
Since her last office visit, the patient self discontinue Lexapro, she thought she was getting sleepy. Currently doing well

## 2011-12-19 NOTE — Patient Instructions (Addendum)
Please get your x-ray at the other   office located at: [address] [place], across from [facility].  Please go to the basement, this is a walk-in facility, they are open from 8:30 to 5:30 PM. Phone number [phone]. ------- For pain, take tylenol or motrin Tylenol  500 mg OTC 2 tabs a day every 8 hours as needed for pain Ibuprofen (Motrin) 200 mg OTC 2 or 3 tabs every 8 hours as need for pain . This medicine can cause gastritis-ulcers in the stomach, if you develop nausea, stomach pain, change in the color of stools : stop ibuprofen and call us  ----- You are due for a complete physical exam, please schedule at your earliest convenience

## 2011-12-20 ENCOUNTER — Ambulatory Visit (INDEPENDENT_AMBULATORY_CARE_PROVIDER_SITE_OTHER)
Admission: RE | Admit: 2011-12-20 | Discharge: 2011-12-20 | Disposition: A | Discharge status: Home / Self Care | Payer: Medicare Other | Source: Ambulatory Visit | Attending: Internal Medicine | Admitting: Internal Medicine

## 2011-12-20 DIAGNOSIS — M542 Cervicalgia: Secondary | ICD-10-CM

## 2012-01-21 ENCOUNTER — Ambulatory Visit (INDEPENDENT_AMBULATORY_CARE_PROVIDER_SITE_OTHER): Payer: Medicare Other | Admitting: *Deleted

## 2012-01-21 DIAGNOSIS — Z23 Encounter for immunization: Secondary | ICD-10-CM

## 2012-02-27 ENCOUNTER — Encounter: Payer: Medicare Other | Admitting: Internal Medicine

## 2012-03-19 ENCOUNTER — Encounter: Payer: Self-pay | Admitting: Internal Medicine

## 2012-03-19 ENCOUNTER — Ambulatory Visit (INDEPENDENT_AMBULATORY_CARE_PROVIDER_SITE_OTHER): Payer: Medicare Other | Admitting: Internal Medicine

## 2012-03-19 VITALS — BP 116/72 | HR 64 | Temp 97.8°F | Ht 63.25 in | Wt 147.0 lb

## 2012-03-19 DIAGNOSIS — L989 Disorder of the skin and subcutaneous tissue, unspecified: Secondary | ICD-10-CM

## 2012-03-19 DIAGNOSIS — Z0000 Encounter for general adult medical examination without abnormal findings: Secondary | ICD-10-CM

## 2012-03-19 DIAGNOSIS — F341 Dysthymic disorder: Secondary | ICD-10-CM

## 2012-03-19 DIAGNOSIS — E785 Hyperlipidemia, unspecified: Secondary | ICD-10-CM

## 2012-03-19 DIAGNOSIS — F32A Depression, unspecified: Secondary | ICD-10-CM

## 2012-03-19 DIAGNOSIS — E039 Hypothyroidism, unspecified: Secondary | ICD-10-CM

## 2012-03-19 DIAGNOSIS — M542 Cervicalgia: Secondary | ICD-10-CM

## 2012-03-19 DIAGNOSIS — M810 Age-related osteoporosis without current pathological fracture: Secondary | ICD-10-CM

## 2012-03-19 DIAGNOSIS — J449 Chronic obstructive pulmonary disease, unspecified: Secondary | ICD-10-CM

## 2012-03-19 DIAGNOSIS — F329 Major depressive disorder, single episode, unspecified: Secondary | ICD-10-CM

## 2012-03-19 DIAGNOSIS — M79609 Pain in unspecified limb: Secondary | ICD-10-CM

## 2012-03-19 LAB — BASIC METABOLIC PANEL
BUN: 14 mg/dL (ref 6–23)
Creatinine, Ser: 0.8 mg/dL (ref 0.4–1.2)
GFR: 79.57 mL/min (ref >60.00)
Potassium: 3.8 mEq/L (ref 3.5–5.1)

## 2012-03-19 LAB — LIPID PANEL
HDL: 41.1 mg/dL (ref >39.00)
Total CHOL/HDL Ratio: 6
Triglycerides: 105 mg/dL (ref 0.0–149.0)
VLDL: 21 mg/dL (ref 0.0–40.0)

## 2012-03-19 LAB — ALT: ALT: 22 U/L (ref 0–35)

## 2012-03-19 LAB — TSH: TSH: 1.14 u[IU]/mL (ref 0.35–5.50)

## 2012-03-19 MED ORDER — ZOSTER VACCINE LIVE 19400 UNT/0.65ML ~~LOC~~ SOLR: 0.6500 mL | Freq: Once | SUBCUTANEOUS | Status: DC

## 2012-03-19 NOTE — Progress Notes (Signed)
Subjective:    Patient ID: Meagan Holmes, female    DOB: [birth year], 71 y.o.   MRN: [id number]  HPI Here for Medicare AWV: 1. Risk factors based on Past M, S, F history: reviewed 2. Physical Activities:  Active at home and in the yard, + exercise x2/week @ church   3. Depression/mood:  some stress, depression screen (-) 4. Hearing: free hearing test ~ 8 months ago (-), no problems reported or noted   5. ADL's:  Independent   6. Fall Risk: low risk, see instructions 7. home Safety: does feel safe at home   8. Height, weight, &visual acuity: see VS, eyes cheked yearly, uses glasses sometimes   9. Counseling: provided 10. Labs ordered based on risk factors: if needed   11. Referral Coordination: if needed 12.  Care Plan, see assessment and plan   13.   Cognitive Assessment: cognition and motor wnl   In addition, today we discussed the following: Neck pain still there Anxiety, takes lorazepam rarely COPD, taking actually no medications, rarely uses albuterol. Actonel, good medication compliance, taking calcium and vitamin D as well. Hypothyroidism, good medication compliance.   Past Medical History  Diagnosis Date  . Allergic rhinitis     a lot better after septoplasty- remote  . Hypothyroidism   . Depression   . Osteopenia   . Hematuria 2004    (-) CT abd-pelvis (-) cystoscopy  . COPD (chronic obstructive pulmonary disease) 10/02/2010   Past Surgical History  Procedure Date  . Appendectomy   . Thyroidectomy   . Carpal tunnel release   . Nasal septum surgery    History   Social History  . Marital Status: Married    Spouse Name: N/A    Number of Children: 1  . Years of Education: N/A   Occupational History  . retired    Social History Main Topics  . Smoking status: Current Every Day Smoker -- 0.5 packs/day  . Smokeless tobacco: Never Used  . Alcohol Use: No  . Drug Use: No  . Sexually Active: Not on file   Other Topics Concern  . 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 Breast cancer Neg Hx   . Colon cancer Neg Hx   . Heart attack Neg Hx   . Cancer Neg Hx   . Heart disease Neg Hx   . Diabetes Mother   . Hypertension Mother      Review of Systems No cough, wheezing or shortness of breath No chest pain, palpitations or lower extremity edema No nausea, vomiting, diarrhea or blood in the stools. No dysuria or gross hematuria.     Objective:   Physical Exam General -- alert, well-developed, and well-nourished.   Neck --no thyromegaly , normal carotid pulse Breasts-- No mass, nodules, thickening, tenderness, bulging, retraction, inflamation, nipple discharge or skin changes noted.  no axillary lymph nodes Lungs -- normal respiratory effort, no intercostal retractions, no accessory muscle use, and normal breath sounds.   Heart-- normal rate, regular rhythm, no murmur, and no gallop.   Abdomen--soft, non-tender, no distention, no masses, no HSM, no guarding, and no rigidity.   Extremities-- no pretibial edema bilaterally Psych-- Cognition and judgment appear intact. Alert and cooperative with normal attention span and concentration.  not anxious appearing and not  depressed appearing.       Assessment & Plan:

## 2012-03-19 NOTE — Assessment & Plan Note (Signed)
Last bone density test 11 2010, T score -3.3. Good compliance with Actonel History of low vitamin D, was treated, last vitamin D normal. Plan: DEXA Continue with exercise, Actonel, calcium and vitamin D supplements

## 2012-03-19 NOTE — Assessment & Plan Note (Addendum)
Td  2008 Pneumonia shot : completed in 2009 zostavax -- benefits discussed, rx provided  Had a flu shot  --2008 saw gyn then due to abnormal cervix , Dx w/  nabothian cyst, had multiple PAPs before, never abnormal;on a monogamus relationship  x decades. Rec PAP q3 years , she likes to keep it q2 years  PAP 6-12 neg. Next PAP: discuss in 2014 --MMG 04-2011 ---neg,  breast exam neg today --had  a colonoscopy in 2003, due for a colonoscopy 2013, will schedule one for ~ 05-2012    Counseled about diet-exercise-tobacco

## 2012-03-19 NOTE — Assessment & Plan Note (Addendum)
Not   better, refer to ortho. Pt declined, "not severe, not hurting every day"

## 2012-03-19 NOTE — Assessment & Plan Note (Signed)
Essentially asymptomatic, counseled about tobacco.

## 2012-03-19 NOTE — Assessment & Plan Note (Signed)
Due for labs

## 2012-03-19 NOTE — Patient Instructions (Addendum)

## 2012-03-19 NOTE — Assessment & Plan Note (Signed)
Improved since she started to exercise more

## 2012-03-19 NOTE — Assessment & Plan Note (Signed)
Has a cyst in the lip, likes a dermatology referral. Will do

## 2012-03-19 NOTE — Assessment & Plan Note (Signed)
+   stress, see social history. Currently doing well

## 2012-03-25 ENCOUNTER — Telehealth: Payer: Self-pay | Admitting: Internal Medicine

## 2012-03-25 NOTE — Telephone Encounter (Signed)
lmovm for pt to return call.  

## 2012-03-25 NOTE — Telephone Encounter (Signed)
Advise patient I got a copy of her screening results, Has osteopenia, we'll discuss further on RTC,  T score - 1.3. Screening for carotid artery disease show "mild disease": recommend formal carotid ultrasound -- dx  carotid artery disease. Please arrange

## 2012-03-27 MED ORDER — ATORVASTATIN CALCIUM 10 MG PO TABS: 10.0000 mg | ORAL_TABLET | Freq: Every day | ORAL | Status: DC

## 2012-03-27 NOTE — Addendum Note (Signed)
Addended by: Edwena Felty T on: 03/27/2012 02:05 PM   Modules accepted: Orders

## 2012-03-27 NOTE — Telephone Encounter (Signed)
Discussed with pt & entered order.

## 2012-04-01 ENCOUNTER — Other Ambulatory Visit: Payer: Medicare Other

## 2012-04-03 ENCOUNTER — Encounter (INDEPENDENT_AMBULATORY_CARE_PROVIDER_SITE_OTHER): Payer: Medicare Other

## 2012-04-03 ENCOUNTER — Other Ambulatory Visit: Payer: Medicare Other

## 2012-04-03 DIAGNOSIS — I6529 Occlusion and stenosis of unspecified carotid artery: Secondary | ICD-10-CM

## 2012-04-06 ENCOUNTER — Ambulatory Visit (INDEPENDENT_AMBULATORY_CARE_PROVIDER_SITE_OTHER)
Admission: RE | Admit: 2012-04-06 | Discharge: 2012-04-06 | Disposition: A | Discharge status: Home / Self Care | Payer: Medicare Other | Source: Ambulatory Visit | Attending: Internal Medicine | Admitting: Internal Medicine

## 2012-04-06 DIAGNOSIS — M810 Age-related osteoporosis without current pathological fracture: Secondary | ICD-10-CM

## 2012-04-17 ENCOUNTER — Encounter: Payer: Self-pay | Admitting: Gastroenterology

## 2012-05-06 ENCOUNTER — Ambulatory Visit (INDEPENDENT_AMBULATORY_CARE_PROVIDER_SITE_OTHER): Payer: Medicare Other | Admitting: Internal Medicine

## 2012-05-06 ENCOUNTER — Encounter: Payer: Self-pay | Admitting: Internal Medicine

## 2012-05-06 VITALS — BP 108/70 | HR 64 | Temp 97.5°F | Wt 149.0 lb

## 2012-05-06 DIAGNOSIS — R21 Rash and other nonspecific skin eruption: Secondary | ICD-10-CM

## 2012-05-06 MED ORDER — VALACYCLOVIR HCL 1 G PO TABS: 1000.0000 mg | ORAL_TABLET | Freq: Three times a day (TID) | ORAL | Status: DC

## 2012-05-06 NOTE — Patient Instructions (Addendum)

## 2012-05-06 NOTE — Progress Notes (Signed)
  Subjective:    Patient ID: Meagan Holmes, female    DOB: 1940/11/26, 72 y.o.   MRN: [id number]  HPI Acute visit 4 days ago developed pain around the right shoulder, 3 days ago developed a rash in the  Right inner arm. Rash was initially slightly itchy now is a slightly tender to touch.  Past Medical History  Diagnosis Date  . Allergic rhinitis     a lot better after septoplasty- remote  . Hypothyroidism   . Depression   . Osteopenia   . Hematuria 2004    (-) CT abd-pelvis (-) cystoscopy  . COPD (chronic obstructive pulmonary disease) 10/02/2010   Past Surgical History  Procedure Date  . Appendectomy   . Thyroidectomy   . Carpal tunnel release   . Nasal septum surgery    History   Social History  . Marital Status: Married    Spouse Name: N/A    Number of Children: 1  . Years of Education: N/A   Occupational History  . retired    Social History Main Topics  . Smoking status: Current Every Day Smoker -- 0.5 packs/day  . Smokeless tobacco: Never Used  . Alcohol Use: No  . Drug Use: No  . Sexually Active: Not on file   Other Topics Concern  . 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 Otherwise feeling well, she did use Nicoderm patch 3 times, the last time she apply it to the  right posterior shoulder and wonders if that is related to her symptoms (not likely).    Objective:   Physical Exam  Musculoskeletal:       Arms: Skin:      General -- alert, well-developed, and well-nourished.   Neck --no thyromegaly, no LADs. Cervical spine nontender to palpation Breasts-- No mass, nodules, thickening, tenderness, bulging, retraction, inflamation, nipple discharge or skin changes noted.  no axillary lymph nodes Neurologic-- alert & oriented X3 and strength normal in all extremities. Psych-- Cognition and judgment appear  intact. Alert and cooperative with normal attention span and concentration.  not anxious appearing and not depressed appearing.        Assessment & Plan:   Right shoulder pain with concomitant rash, I'm suspicious of shingles although I'm not 100% sure. Plan: Treat with Valtrex. Patient will call if not improving soon.

## 2012-05-07 ENCOUNTER — Telehealth: Payer: Self-pay | Admitting: *Deleted

## 2012-05-07 MED ORDER — HYDROCODONE-ACETAMINOPHEN 5-500 MG PO TABS: 1.0000 | ORAL_TABLET | Freq: Four times a day (QID) | ORAL | Status: DC | PRN

## 2012-05-07 NOTE — Telephone Encounter (Signed)
Discussed with pt

## 2012-05-07 NOTE — Telephone Encounter (Signed)
Patient here yesterday for OV, treated now for shingles. Patient is complaining of a lot of pain with the shingles and would like something called in to help with that. Also wants bone density results from last month. Please advise.

## 2012-05-07 NOTE — Telephone Encounter (Signed)
DEXA 04-06-12 showed osteopenia (Tscore -2.0), actually better than previous. Very good results, contn same meds for now, will discuss further on RTC. For pain , see Rx

## 2012-05-18 ENCOUNTER — Telehealth: Payer: Self-pay | Admitting: Internal Medicine

## 2012-05-18 MED ORDER — GABAPENTIN 300 MG PO CAPS: 300.0000 mg | ORAL_CAPSULE | Freq: Two times a day (BID) | ORAL | Status: DC

## 2012-05-18 NOTE — Telephone Encounter (Signed)
Recommend to try gabapentin 300 mg one by mouth twice a day. #60 no refills. Needs to watch for excessive somnolence. If she's not improving, arrange a office visit for Friday or Monday.

## 2012-05-18 NOTE — Telephone Encounter (Signed)
Please advise 

## 2012-05-18 NOTE — Telephone Encounter (Signed)
Sent rx to pharmacy, pt made aware.

## 2012-05-18 NOTE — Telephone Encounter (Signed)
pt stated she  is no better wt/pain from shingles and she is not sleeping well--cb# [phone] Pt stated Hydrocodone is not helping her pain at all, she is still having pain in her back all the way down her arms.

## 2012-05-21 ENCOUNTER — Ambulatory Visit (INDEPENDENT_AMBULATORY_CARE_PROVIDER_SITE_OTHER): Payer: Medicare Other | Admitting: Internal Medicine

## 2012-05-21 ENCOUNTER — Encounter: Payer: Self-pay | Admitting: Internal Medicine

## 2012-05-21 VITALS — BP 124/80 | HR 64 | Temp 98.2°F | Wt 151.4 lb

## 2012-05-21 DIAGNOSIS — B0229 Other postherpetic nervous system involvement: Secondary | ICD-10-CM

## 2012-05-21 NOTE — Patient Instructions (Addendum)
Assess response to the gabapentin 300 mg  one every 8 hours as needed. If it is only  partially beneficial, add Cymbalta 30 mg daily as trial

## 2012-05-21 NOTE — Progress Notes (Signed)
  Subjective:    Patient ID: Meagan Holmes, female    DOB: 04-06-1941, 72 y.o.   MRN: [id number]  HPI post Herpetic Neuralgia: Location:RUE & R lateral thorax Onset:05/07/12 Trigger/injury:Zoster Pain quality:throbbing & pounding upper back;soreness R axillary area Pain severity: up to 9 Duration:constant Radiation:R breast Exacerbating factors: Treatment/response:Gabapentin 300 mg bid & narcotic w/o benefit        Review of Systems Constitutional: no fever, chills, sweats. 5 # rise in weight off cigarettes Musculoskeletal:no  muscle cramps or pain; no  joint stiffness, redness, or swelling Skin:no rash, color/temp change Neuro: no weakness;  numbness and tingling Heme:no lymphadenopathy; abnormal bruising or bleeding      Objective:   Physical Exam Gen.:  well-nourished in appearance. Alert, appropriate and cooperative throughout exam. Appears younger than stated age    Eyes: No corneal or conjunctival inflammation noted. No icterus Musculoskeletal/extremities: No deformity or scoliosis noted of  the thoracic or lumbar spine. No clubbing, cyanosis, edema, or significant extremity  deformity noted. Range of motion normal .Tone & strength  Normal.R index  mild  DJD DIP changes. Nail health good.  Skin: Well healed herpetic rash in T.-1 dermatome right biceps and lateral thorax area as well as upper back Lymph: No cervical, axillary lymphadenopathy present. Psych: Mood and affect are normal. Normally interactive                                                                                         Assessment & Plan:  #1 postherpetic neuralgia; pathophysiology discussed.  Plan: Increase gabapentin to 300 mg every 8 hours as needed. She'll be given a sample of Cymbalta 30 mg to take as a trial if the gabapentin is ineffective. Another option would be acupuncture

## 2012-05-29 ENCOUNTER — Encounter: Payer: Medicare Other | Admitting: Internal Medicine

## 2012-06-04 ENCOUNTER — Other Ambulatory Visit: Payer: Self-pay | Admitting: Internal Medicine

## 2012-06-05 NOTE — Telephone Encounter (Signed)
Discussed with pt. Refill done.

## 2012-06-05 NOTE — Telephone Encounter (Signed)
Please call the patient, if she thinks the medication was helping her, okay to restart Flexeril 10 mg one by mouth qhs when necessary #30 and one refill

## 2012-06-05 NOTE — Telephone Encounter (Signed)
i do not see med on pt's current med list. OK to refill? 

## 2012-06-09 ENCOUNTER — Ambulatory Visit (AMBULATORY_SURGERY_CENTER): Payer: Medicare Other

## 2012-06-09 VITALS — Ht 63.5 in | Wt 153.0 lb

## 2012-06-09 DIAGNOSIS — Z1211 Encounter for screening for malignant neoplasm of colon: Secondary | ICD-10-CM

## 2012-06-09 MED ORDER — NA SULFATE-K SULFATE-MG SULF 17.5-3.13-1.6 GM/177ML PO SOLN: 1.0000 | Freq: Once | ORAL | Status: DC

## 2012-06-09 NOTE — Progress Notes (Signed)
Pt recently had shingles ?

## 2012-06-16 ENCOUNTER — Ambulatory Visit (AMBULATORY_SURGERY_CENTER): Payer: Medicare Other | Admitting: Gastroenterology

## 2012-06-16 ENCOUNTER — Encounter: Payer: Self-pay | Admitting: Gastroenterology

## 2012-06-16 VITALS — BP 109/62 | HR 55 | Temp 97.8°F | Resp 16 | Ht 63.0 in | Wt 153.0 lb

## 2012-06-16 DIAGNOSIS — K5730 Diverticulosis of large intestine without perforation or abscess without bleeding: Secondary | ICD-10-CM

## 2012-06-16 DIAGNOSIS — Z1211 Encounter for screening for malignant neoplasm of colon: Secondary | ICD-10-CM

## 2012-06-16 MED ORDER — SODIUM CHLORIDE 0.9 % IV SOLN: 500.0000 mL | INTRAVENOUS | Status: DC

## 2012-06-16 NOTE — Progress Notes (Signed)
A/o x 3 pleased report to April RN 

## 2012-06-16 NOTE — Progress Notes (Signed)
Patient did not experience any of the following events: a burn prior to discharge; a fall within the facility; wrong site/side/patient/procedure/implant event; or a hospital transfer or hospital admission upon discharge from the facility. (G8907) Patient did not have preoperative order for IV antibiotic SSI prophylaxis. (G8918)  

## 2012-06-16 NOTE — Patient Instructions (Addendum)

## 2012-06-16 NOTE — Op Note (Signed)
[facility] [address] [place], [address]   COLONOSCOPY PROCEDURE REPORT  PATIENT: Meagan, Holmes  MR#: [id number] BIRTHDATE: [birth year] , 71  yrs. old GENDER: Female ENDOSCOPIST: Louis Meckel, MD REFERRED BY: PROCEDURE DATE:  06/16/2012 PROCEDURE:   Colonoscopy, diagnostic ASA CLASS:   Class II INDICATIONS:average risk screening. MEDICATIONS: MAC sedation, administered by CRNA and propofol (Diprivan) 200mg  IV  DESCRIPTION OF PROCEDURE:   After the risks benefits and alternatives of the procedure were thoroughly explained, informed consent was obtained.  A digital rectal exam revealed no abnormalities of the rectum.   The LB [id number]  endoscope was introduced through the anus and advanced to the cecum, which was identified by both the appendix and ileocecal valve. No adverse events experienced.   The quality of the prep was Suprep excellent The instrument was then slowly withdrawn as the colon was fully examined.      COLON FINDINGS: Mild diverticulosis was noted in the sigmoid colon. The colon mucosa was otherwise normal.  Retroflexed views revealed no abnormalities. The time to cecum=2 minutes 23 seconds. Withdrawal time=8 minutes 44 seconds.  The scope was withdrawn and the procedure completed. COMPLICATIONS: There were no complications.  ENDOSCOPIC IMPRESSION: 1.   Mild diverticulosis was noted in the sigmoid colon 2.   The colon mucosa was otherwise normal  RECOMMENDATIONS: Continue current colorectal screening recommendations for "routine risk" patients with a repeat colonoscopy in 10 years.   eSigned:  Louis Meckel, MD 06/16/2012 9:44 AM   cc:

## 2012-06-17 ENCOUNTER — Telehealth: Payer: Self-pay | Admitting: *Deleted

## 2012-06-17 NOTE — Telephone Encounter (Signed)
Number identifier, left message, follow-up  

## 2012-06-18 ENCOUNTER — Other Ambulatory Visit: Payer: Self-pay | Admitting: Internal Medicine

## 2012-06-18 NOTE — Telephone Encounter (Signed)
Refill done.  

## 2012-06-23 ENCOUNTER — Encounter: Payer: Self-pay | Admitting: *Deleted

## 2012-07-29 ENCOUNTER — Other Ambulatory Visit: Payer: Self-pay

## 2012-09-08 ENCOUNTER — Telehealth: Payer: Self-pay | Admitting: Internal Medicine

## 2012-09-08 NOTE — Telephone Encounter (Signed)
Pt was called back stated she would like a referral to a Endocrinologist within [place] due to her latest eye exam. She was advised at that appointment that they felt one of her eyes might be larger than the other and this could be affected by her Thyroid. Latest TSH was completed on 02/2012 and was 1.14, Please advise.

## 2012-09-08 NOTE — Telephone Encounter (Signed)
She is due for a six-month checkup this month, I recommend her to schedule that and then we'll see if is necessary to send her to endocrinology.

## 2012-09-08 NOTE — Telephone Encounter (Signed)
Referral for thyroid specialization-pls call pt

## 2012-09-09 NOTE — Telephone Encounter (Signed)
Called pt and LMOVM informing her that she needs to be seen in our office for a 6 month visit before a referral can be made.

## 2012-09-16 ENCOUNTER — Encounter: Payer: Self-pay | Admitting: Internal Medicine

## 2012-09-30 ENCOUNTER — Other Ambulatory Visit: Payer: Self-pay | Admitting: Internal Medicine

## 2012-10-01 NOTE — Telephone Encounter (Signed)
Refill done.  Pt is due for lab work.

## 2012-11-09 ENCOUNTER — Telehealth: Payer: Self-pay | Admitting: *Deleted

## 2012-11-09 NOTE — Telephone Encounter (Signed)
Pt left VM that her granddaughter was just informed that she has hair lice and she currently live with her. Pt indicated that the pediatric is given her a Rx but she would need to call her PCP to obtain a Rx. Pt uses [facility]. .Please advise

## 2012-11-10 MED ORDER — PERMETHRIN 1 % EX LIQD: Freq: Once | CUTANEOUS | Status: DC

## 2012-11-10 NOTE — Telephone Encounter (Signed)
done

## 2012-11-10 NOTE — Telephone Encounter (Signed)
rx sent by Dr. Drue Novel

## 2013-01-12 ENCOUNTER — Other Ambulatory Visit: Payer: Self-pay | Admitting: Internal Medicine

## 2013-01-21 ENCOUNTER — Other Ambulatory Visit: Payer: Self-pay | Admitting: Internal Medicine

## 2013-01-21 NOTE — Telephone Encounter (Signed)
rx refilled per protocol. DJR  

## 2013-01-29 ENCOUNTER — Ambulatory Visit (INDEPENDENT_AMBULATORY_CARE_PROVIDER_SITE_OTHER): Payer: Medicare Other | Admitting: Internal Medicine

## 2013-01-29 ENCOUNTER — Telehealth: Payer: Self-pay

## 2013-01-29 ENCOUNTER — Ambulatory Visit: Payer: Medicare Other

## 2013-01-29 VITALS — BP 120/70 | HR 80 | Temp 97.9°F | Resp 16 | Ht 63.5 in | Wt 147.0 lb

## 2013-01-29 DIAGNOSIS — M25559 Pain in unspecified hip: Secondary | ICD-10-CM

## 2013-01-29 DIAGNOSIS — M25561 Pain in right knee: Secondary | ICD-10-CM

## 2013-01-29 DIAGNOSIS — IMO0001 Reserved for inherently not codable concepts without codable children: Secondary | ICD-10-CM

## 2013-01-29 DIAGNOSIS — M25551 Pain in right hip: Secondary | ICD-10-CM

## 2013-01-29 DIAGNOSIS — M25569 Pain in unspecified knee: Secondary | ICD-10-CM

## 2013-01-29 LAB — COMPREHENSIVE METABOLIC PANEL
Albumin: 4.1 g/dL (ref 3.5–5.2)
Alkaline Phosphatase: 45 U/L (ref 39–117)
BUN: 10 mg/dL (ref 6–23)
CO2: 28 mEq/L (ref 19–32)
Glucose, Bld: 103 mg/dL — ABNORMAL HIGH (ref 70–99)
Potassium: 4.7 mEq/L (ref 3.5–5.3)
Sodium: 139 mEq/L (ref 135–145)
Total Bilirubin: 0.5 mg/dL (ref 0.3–1.2)
Total Protein: 7.6 g/dL (ref 6.0–8.3)

## 2013-01-29 LAB — CK: Total CK: 55 U/L (ref 7–177)

## 2013-01-29 LAB — POCT CBC
Granulocyte percent: 73.2 %G (ref 37–80)
HCT, POC: 43.5 % (ref 37.7–47.9)
MCH, POC: 30.8 pg (ref 27–31.2)
MCV: 96.3 fL (ref 80–97)
MID (cbc): 0.6 (ref 0–0.9)
POC LYMPH PERCENT: 21.5 %L (ref 10–50)
Platelet Count, POC: 248 10*3/uL (ref 142–424)
RDW, POC: 14.8 %
WBC: 10.4 10*3/uL — AB (ref 4.6–10.2)

## 2013-01-29 LAB — TSH: TSH: 1.283 u[IU]/mL (ref 0.350–4.500)

## 2013-01-29 MED ORDER — MELOXICAM 15 MG PO TABS: 15.0000 mg | ORAL_TABLET | Freq: Every day | ORAL | Status: DC

## 2013-01-29 MED ORDER — TRAMADOL HCL 50 MG PO TABS: 50.0000 mg | ORAL_TABLET | Freq: Three times a day (TID) | ORAL | Status: DC | PRN

## 2013-01-29 NOTE — Telephone Encounter (Signed)
Patient states that she had blood drawn at her OV today (01/29/2013) and wants to know if they can look at her thyroid levels.  [phone]

## 2013-01-29 NOTE — Progress Notes (Addendum)
Subjective:    Patient ID: Meagan Holmes, female    DOB: 1940-07-28, 72 y.o.   MRN: [id number]  HPI Starting having right leg pain 5 days ago. Has had worsening pain over the last 2 days. No injury. Did yard work 3 days ago, mowing, trimming. Not unusual work for her. She isn't sure the pain started after this .  Difficulty lifting leg, easier to lift leg when lying down. Hurts less with lying down or standing. No sleep disruption. Took flexeril x 2-3 without relief. Took ibuprofen twice without relief. No numbness or tingling into foot. No abdominal pain, no back pain. No rash.No fever, no chills,  (Vomiting x 1 after ibuprofen 800 mg)  Flexiril helped sleep  Patient Active Problem List   Diagnosis Date Noted  . Neck pain 12/19/2011  . COPD (chronic obstructive pulmonary disease) 10/02/2010  . Osteoporosis 10/02/2010  . HYPERLIPIDEMIA, MILD 02/17/2009  . TOBACCO ABUSE 02/17/2009  . HYPOTHYROIDISM 02/10/2007  . Anxiety and depression 02/10/2007  . ALLERGIC RHINITIS 02/10/2007  . HEMATURIA, MICROSCOPIC, HX OF 02/10/2007   Current outpatient prescriptions:aspirin 81 MG tablet, Take 81 mg by mouth daily., Disp: , Rfl: ;   calcium gluconate 500 MG tablet, Take 500 mg by mouth daily.  , Disp: , Rfl: ;   Cholecalciferol (VITAMIN D3 PO), Take by mouth., Disp: , Rfl: ;   levothyroxine (SYNTHROID, LEVOTHROID) 88 MCG tablet, TAKE 1 TABLET BY MOUTH EVERY DAY, Disp: 90 tablet, Rfl: 1 LORazepam (ATIVAN) 0.5 MG tablet, Take 0.5 mg by mouth every 6 (six) hours as needed.  , Disp: , Rfl: ;   atorvastatin (LIPITOR) 10 MG tablet, TAKE 1 TABLET (10 MG TOTAL) BY MOUTH DAILY., Disp: 30 tablet, Rfl: 0;   CINNAMON PO, Take 1 tablet by mouth daily., Disp: , Rfl: ;   cyclobenzaprine (FLEXERIL) 10 MG tablet, TAKE 1 TABLET (10 MG TOTAL) BY MOUTH AT BEDTIME AS NEEDED FOR MUSCLE SPASMS., Disp: 21 tablet, Rfl: 1 DULoxetine (CYMBALTA) 30 MG capsule, Take 30 mg by mouth daily., Disp: , Rfl: ;   meloxicam (MOBIC)  15 MG tablet, Take 1 tablet (15 mg total) by mouth daily., Disp: 15 tablet, Rfl: 0;   risedronate (ACTONEL) 150 MG tablet, Take 1 tablet (150 mg total) by mouth every 30 (thirty) days. with water on empty stomach, nothing by mouth or lie down for next 30 minutes., Disp: 12 tablet, Rfl: 0 traMADol (ULTRAM) 50 MG tablet, Take 1 tablet (50 mg total) by mouth every 8 (eight) hours as needed for pain., Disp: 15 tablet, Rfl: 0  actonel was added within this year  Review of Systems Recent zoster No GU or GI sxtoms x after ibupr No change in appetite No fever/fatigue    Objective:   Physical Exam  Constitutional: She is oriented to person, place, and time. She appears well-developed and well-nourished.  Eyes: Conjunctivae are normal. Pupils are equal, round, and reactive to light.  Neck: Normal range of motion. Neck supple. No thyromegaly present.  Cardiovascular: Normal rate, regular rhythm and normal heart sounds.   No murmur heard. Pulmonary/Chest: Effort normal and breath sounds normal.  Abdominal: She exhibits no mass. There is no tenderness. There is no rebound and no guarding.  No ing bruits  Musculoskeletal: She exhibits tenderness. She exhibits no edema.  Right hip with decreased ROM, pain with flexion, adduction. Some tenderness with palpation over lateral thigh. No point tenderness.No cords,masses, defects in posterior thighs.  Neurological: She is alert and oriented to person,  place, and time. No cranial nerve deficit.  Skin: Skin is warm and dry. Rash noted.  Some non raised erythema on buttock. This is site of heating pad application according to the patient.  Psychiatric: She has a normal mood and affect. Thought content normal.      [facility] reading (PRIMARY) by  Dr. Josephina Gip bony lesions or degen changes  Assessment & Plan:  Pain, hip, right - Plan: DG Femur Right  Pain in joint, lower leg, right - Plan: DG Hip Complete Right, Comprehensive metabolic panel  Myalgia and  myositis - Plan: POCT CBC, POCT SEDIMENTATION RATE, TSH, CK    . meloxicam (MOBIC) 15 MG tablet    Sig: Take 1 tablet (15 mg total) by mouth daily.    Dispense:  15 tablet    Refill:  0  . traMADol (ULTRAM) 50 MG tablet    Sig: Take 1 tablet (50 mg total) by mouth every 8 (eight) hours as needed for pain.    Dispense:  15 tablet    Refill:  0   Can also use flexeril that she has at home at bedtime. Will call with labs and f/u plan If no significant improvement in 5-7 days, ortho referral. If any worsening, can return.  Addend 02/09/13=labs not diagnostic Needs ortho eval!!

## 2013-01-29 NOTE — Telephone Encounter (Signed)
Dr Merla Riches has already put in order for this. Pt advised

## 2013-02-05 ENCOUNTER — Telehealth: Payer: Self-pay

## 2013-02-05 NOTE — Telephone Encounter (Signed)
PT CALLED ASKING ABOUT HER LAB RESULTS. PLEASE CALL  [phone]

## 2013-02-06 ENCOUNTER — Ambulatory Visit (INDEPENDENT_AMBULATORY_CARE_PROVIDER_SITE_OTHER): Payer: Medicare Other | Admitting: Emergency Medicine

## 2013-02-06 VITALS — BP 112/60 | HR 65 | Temp 98.0°F | Resp 16 | Ht 63.0 in | Wt 150.0 lb

## 2013-02-06 DIAGNOSIS — M549 Dorsalgia, unspecified: Secondary | ICD-10-CM

## 2013-02-06 MED ORDER — MELOXICAM 15 MG PO TABS: 15.0000 mg | ORAL_TABLET | Freq: Every day | ORAL | Status: DC

## 2013-02-06 MED ORDER — CYCLOBENZAPRINE HCL 10 MG PO TABS: ORAL_TABLET | ORAL | Status: DC

## 2013-02-06 NOTE — Patient Instructions (Signed)
Please be at  Dr. Florence Canner office at 4:15 today. His office address is [address] which is on the second floor when you get there call [phone]

## 2013-02-06 NOTE — Progress Notes (Signed)
Subjective:  This chart was scribed for Lucilla Edin, MD by Arlan Organ, ED Scribe. This patient was seen in room Room 4 and the patient's care was started 2:52 PM.    Patient ID: Meagan Holmes, female    DOB: Aug 13, 1940, 72 y.o.   MRN: [id number]  HPI HPI Comments: Meagan Holmes is a 72 y.o. female who presents to the Emergency Department complaining of an eye injury that occurred last night. Pt states a magazine may have cut her in the eye when she was sitting with her granddaughter before bed last night. Pt denies fever.   Review of Systems  Constitutional: Negative for fever.  HENT:       Eye laceration    Past Medical History  Diagnosis Date  . Allergic rhinitis     a lot better after septoplasty- remote  . Hypothyroidism   . Depression   . Osteopenia   . Hematuria 2004    (-) CT abd-pelvis (-) cystoscopy  . COPD (chronic obstructive pulmonary disease) 10/02/2010    History   Social History  . Marital Status: Married    Spouse Name: N/A    Number of Children: 1  . Years of Education: N/A   Occupational History  . retired    Social History Main Topics  . Smoking status: Current Every Day Smoker -- 0.25 packs/day    Types: Cigarettes    Start date: 04/29/2012  . Smokeless tobacco: Never Used  . Alcohol Use: No  . Drug Use: No  . Sexual Activity: Not on file   Other Topics Concern  . 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 Appendectomy    . Thyroidectomy    . Carpal tunnel release    . Nasal septum surgery      Family History  Problem Relation Age of Onset  . Breast cancer Neg Hx   . Colon cancer Neg Hx   . Heart attack Neg Hx   . Cancer Neg Hx   . Heart disease Neg Hx   . Diabetes Mother   . Hypertension Mother        Objective:  Physical Exam  Constitutional: She is  oriented to person, place, and time. She appears well-developed and well-nourished. No distress.  HENT:  Head: Normocephalic.  Eyes: Conjunctivae are normal. Pupils are equal, round, and reactive to light. No scleral icterus.  Half cm flap like laceration just beneath cornea left eye at the 4 o'clock position  Neck: Normal range of motion. Neck supple. No thyromegaly present.  Cardiovascular: Normal rate and regular rhythm.  Exam reveals no gallop and no friction rub.   No murmur heard. Pulmonary/Chest: Effort normal and breath sounds normal. No respiratory distress. She has no wheezes. She has no rales.  Abdominal: Soft. Bowel sounds are normal. She exhibits no distension. There is no tenderness. There is no rebound.  Musculoskeletal: Normal range of motion.  Neurological: She is alert and oriented to person, place, and time.  Skin: Skin is warm and dry. No rash noted.  Psychiatric: She has a normal mood and affect. Her behavior is normal.    Assessment & Plan:  Patient referred to Dr. Vonna Kotyk specialist for evaluation  of the injury to her left eye

## 2013-02-09 NOTE — Addendum Note (Signed)
Addended by: Tonye Pearson on: 02/09/2013 10:46 PM   Modules accepted: Orders

## 2013-02-23 ENCOUNTER — Other Ambulatory Visit: Payer: Self-pay | Admitting: Internal Medicine

## 2013-02-24 NOTE — Telephone Encounter (Signed)
rx refilled per protocol. DJR  

## 2013-03-29 ENCOUNTER — Telehealth: Payer: Self-pay

## 2013-03-29 NOTE — Telephone Encounter (Signed)
Left message for call back Non identifiable  PNA--01/2008 Flu vaccine--12/2012 Tdap--01/2007 CCS--05/2012--Dr Kaplan--nml--next due 2024 MMG---08/2012--benign findings Dexa--05/2012--osteopenia 2008 saw gyn then due to abnormal cervix , Dx w/ nabothian cyst, had multiple PAPs before, never abnormal;on a monogamus relationship x decades. Rec PAP q3 years , she likes to keep it q2 years  PAP 6-12 neg. Next PAP: discuss in 2014

## 2013-03-30 ENCOUNTER — Ambulatory Visit (INDEPENDENT_AMBULATORY_CARE_PROVIDER_SITE_OTHER): Payer: Medicare Other | Admitting: Internal Medicine

## 2013-03-30 ENCOUNTER — Other Ambulatory Visit (HOSPITAL_COMMUNITY)
Admission: RE | Admit: 2013-03-30 | Discharge: 2013-03-30 | Disposition: A | Discharge status: Home / Self Care | Payer: Medicare Other | Source: Ambulatory Visit | Attending: Internal Medicine | Admitting: Internal Medicine

## 2013-03-30 ENCOUNTER — Encounter: Payer: Self-pay | Admitting: Internal Medicine

## 2013-03-30 VITALS — BP 120/74 | HR 63 | Temp 98.0°F | Ht 63.2 in | Wt 148.0 lb

## 2013-03-30 DIAGNOSIS — M810 Age-related osteoporosis without current pathological fracture: Secondary | ICD-10-CM

## 2013-03-30 DIAGNOSIS — Z0000 Encounter for general adult medical examination without abnormal findings: Secondary | ICD-10-CM

## 2013-03-30 DIAGNOSIS — J449 Chronic obstructive pulmonary disease, unspecified: Secondary | ICD-10-CM

## 2013-03-30 DIAGNOSIS — F329 Major depressive disorder, single episode, unspecified: Secondary | ICD-10-CM

## 2013-03-30 DIAGNOSIS — E039 Hypothyroidism, unspecified: Secondary | ICD-10-CM

## 2013-03-30 DIAGNOSIS — F341 Dysthymic disorder: Secondary | ICD-10-CM

## 2013-03-30 DIAGNOSIS — Z124 Encounter for screening for malignant neoplasm of cervix: Secondary | ICD-10-CM | POA: Insufficient documentation

## 2013-03-30 DIAGNOSIS — E785 Hyperlipidemia, unspecified: Secondary | ICD-10-CM

## 2013-03-30 NOTE — Assessment & Plan Note (Addendum)
DEXA 03-2012 T score - 2.0, better than before  Cost is an issue, self d/c actonel. Plan-- dexa in 2 years

## 2013-03-30 NOTE — Patient Instructions (Addendum)
Schedule labs : FLP dx hyperlipidemia   Next visit for a   follow up , no fasting, in 6 months  Please make an appointment     If you need more information about quitting tobacco, the American [employer] Association is a Radiographer, therapeutic at:  [facility]    Fall Prevention and Home Safety Falls cause injuries and can affect all age groups. It is possible to use preventive measures to significantly decrease the likelihood of falls. There are many simple measures which can make your home safer and prevent falls. OUTDOORS  Repair cracks and edges of walkways and driveways.  Remove high doorway thresholds.  Trim shrubbery on the main path into your home.  Have good outside lighting.  Clear walkways of tools, rocks, debris, and clutter.  Check that handrails are not broken and are securely fastened. Both sides of steps should have handrails.  Have leaves, snow, and ice cleared regularly.  Use sand or salt on walkways during winter months.  In the garage, clean up grease or oil spills. BATHROOM  Install night lights.  Install grab bars by the toilet and in the tub and shower.  Use non-skid mats or decals in the tub or shower.  Place a plastic non-slip stool in the shower to sit on, if needed.  Keep floors dry and clean up all water on the floor immediately.  Remove soap buildup in the tub or shower on a regular basis.  Secure bath mats with non-slip, double-sided rug tape.  Remove throw rugs and tripping hazards from the floors. BEDROOMS  Install night lights.  Make sure a bedside light is easy to reach.  Do not use oversized bedding.  Keep a telephone by your bedside.  Have a firm chair with side arms to use for getting dressed.  Remove throw rugs and tripping hazards from the floor. KITCHEN  Keep handles on pots and pans turned toward the center of the stove. Use back burners when possible.  Clean up spills quickly and allow time for  drying.  Avoid walking on wet floors.  Avoid hot utensils and knives.  Position shelves so they are not too high or low.  Place commonly used objects within easy reach.  If necessary, use a sturdy step stool with a grab bar when reaching.  Keep electrical cables out of the way.  Do not use floor polish or wax that makes floors slippery. If you must use wax, use non-skid floor wax.  Remove throw rugs and tripping hazards from the floor. STAIRWAYS  Never leave objects on stairs.  Place handrails on both sides of stairways and use them. Fix any loose handrails. Make sure handrails on both sides of the stairways are as long as the stairs.  Check carpeting to make sure it is firmly attached along stairs. Make repairs to worn or loose carpet promptly.  Avoid placing throw rugs at the top or bottom of stairways, or properly secure the rug with carpet tape to prevent slippage. Get rid of throw rugs, if possible.  Have an electrician put in a light switch at the top and bottom of the stairs. OTHER FALL PREVENTION TIPS  Wear low-heel or rubber-soled shoes that are supportive and fit well. Wear closed toe shoes.  When using a stepladder, make sure it is fully opened and both spreaders are firmly locked. Do not climb a closed stepladder.  Add color or contrast paint or tape to grab bars and handrails in your home. Place contrasting  color strips on first and last steps.  Learn and use mobility aids as needed. Install an electrical emergency response system.  Turn on lights to avoid dark areas. Replace light bulbs that burn out immediately. Get light switches that glow.  Arrange furniture to create clear pathways. Keep furniture in the same place.  Firmly attach carpet with non-skid or double-sided tape.  Eliminate uneven floor surfaces.  Select a carpet pattern that does not visually hide the edge of steps.  Be aware of all pets. OTHER HOME SAFETY TIPS  Set the water temperature  for 120 F (48.8 C).  Keep emergency numbers on or near the telephone.  Keep smoke detectors on every level of the home and near sleeping areas. Document Released: 04/05/2002 Document Revised: 10/15/2011 Document Reviewed: 07/05/2011 [facility] Patient Information 2014 [place], [place].

## 2013-03-30 NOTE — Assessment & Plan Note (Signed)
Last TSH Wnl, no change

## 2013-03-30 NOTE — Assessment & Plan Note (Signed)
Labs

## 2013-03-30 NOTE — Assessment & Plan Note (Signed)
On no meds at present , some stress but stable

## 2013-03-30 NOTE — Progress Notes (Signed)
Pre visit review using our clinic review tool, if applicable. No additional management support is needed unless otherwise documented below in the visit note. 

## 2013-03-30 NOTE — Assessment & Plan Note (Signed)
Still smoking, counseled 

## 2013-03-30 NOTE — Progress Notes (Signed)
Subjective:    Patient ID: Meagan Holmes, female    DOB: 25-Mar-1941, 72 y.o.   MRN: [id number]  HPI  Subjective:     Here for Medicare AWV:  1. Risk factors based on Past M, S, F history: reviewed  2. Physical Activities: Active at home  3. Depression/mood: still some stress, depression screen (-)  4. Hearing: no problems reported or noted  5. ADL's: Independent , drives 6. Fall Risk: low risk, see instructions  7. home Safety: does feel safe at home  8. Height, weight, &visual acuity: see VS, eyes cheked yearly, uses glasses sometimes  9. Counseling: provided  10. Labs ordered based on risk factors: if needed  11. Referral Coordination: if needed  12. Care Plan, see assessment and plan  13. Cognitive Assessment: cognition and motor appropriate for age  In addition, today we discussed the following: Hypothyroidism--comparison medications Hyperlipidemia--on Lipitor, no apparent side effects. Osteopenia--self discontinue Actonel, cost was one of the issues.  Past Medical History  Diagnosis Date  . Allergic rhinitis     a lot better after septoplasty- remote  . Hypothyroidism   . Depression   . Osteopenia   . Hematuria 2004    (-) CT abd-pelvis (-) cystoscopy  . COPD (chronic obstructive pulmonary disease) 10/02/2010   Past Surgical History  Procedure Laterality Date  . Appendectomy    . Thyroidectomy    . Carpal tunnel release    . Nasal septum surgery     History   Social History  . Marital Status: Married    Spouse Name: N/A    Number of Children: 1  . Years of Education: N/A   Occupational History  . retired    Social History Main Topics  . Smoking status: Current Every Day Smoker -- 0.50 packs/day    Types: Cigarettes    Start date: 04/29/2012  . Smokeless tobacco: Never Used  . Alcohol Use: No  . Drug Use: No  . Sexual Activity: Not on file   Other Topics Concern  . Not on file   Social History Narrative   Lost son (to drugs) 2008     Husband ill, she takes care of him, raising her g-daughter                    Family History  Problem Relation Age of Onset  . Breast cancer Neg Hx   . Colon cancer Neg Hx   . Heart attack Neg Hx   . Cancer Neg Hx   . Heart disease Neg Hx   . Diabetes Mother   . Hypertension Mother    Review of Systems No  CP, SOB  denies  nausea, vomiting diarrhea Denies  blood in the stools (-) cough, sputum production, (-) wheezing, chest congestion No dysuria, gross hematuria, difficulty urinating   No vag d/c or bleed     Objective:   Physical Exam  BP 120/74  Pulse 63  Temp(Src) 98 F (36.7 C)  Ht 5' 3.2" (1.605 m)  Wt 148 lb (67.132 kg)  BMI 26.06 kg/m2  SpO2 99% General -- alert, well-developed, NAD.  Neck --no thyromegaly Breast-- no dominant mass, skin and nipples normal to inspection on palpation, axillary areas without mass or lymphadenopathy Lungs -- normal respiratory effort, no intercostal retractions, no accessory muscle use, and normal breath sounds.  Heart-- normal rate, regular rhythm, no murmur.  Abdomen-- Not distended, good bowel sounds,soft, non-tender. GU-- external exam (-), cervix w/o d/c  or redness, bimanual w/o mass  Extremities-- no pretibial edema bilaterally  Neurologic--  alert & oriented X3. Speech normal, gait normal, strength normal in all extremities.  Psych-- Cognition and judgment appear intact. Cooperative with normal attention span and concentration. No anxious appearing , no depressed appearing.      Assessment & Plan:  Also-- soft mass at R great toe, exam is confirmatory, soft callous, refer to podiatry

## 2013-03-30 NOTE — Assessment & Plan Note (Addendum)
Td  2008 Pneumonia shot : completed in 2009 zostavax --  rx was provided , never did get it ($) Had a flu shot  --2008 saw gyn then due to abnormal cervix , Dx w/  nabothian cyst, had multiple PAPs before, never abnormal;on a monogamus relationship  x decades. PAP sent today, next in 2-3 years  --MMG 2014 ---neg,  breast exam neg today --had  a colonoscopy in 2003, again  05/2012--Dr Kaplan--nml--next due 2024  Counseled about diet-exercise-tobacco

## 2013-03-31 NOTE — Telephone Encounter (Signed)
Unable to reach pre visit.  

## 2013-04-02 ENCOUNTER — Encounter: Payer: Self-pay | Admitting: *Deleted

## 2013-04-05 ENCOUNTER — Other Ambulatory Visit: Payer: Self-pay | Admitting: Internal Medicine

## 2013-04-05 NOTE — Telephone Encounter (Signed)
rx refilled per protocol. DJR  

## 2013-04-06 ENCOUNTER — Other Ambulatory Visit (INDEPENDENT_AMBULATORY_CARE_PROVIDER_SITE_OTHER): Payer: Medicare Other

## 2013-04-06 DIAGNOSIS — E785 Hyperlipidemia, unspecified: Secondary | ICD-10-CM

## 2013-04-06 LAB — LIPID PANEL
Cholesterol: 182 mg/dL (ref 0–200)
HDL: 39.8 mg/dL (ref >39.00)
LDL Cholesterol: 122 mg/dL — ABNORMAL HIGH (ref 0–99)
Total CHOL/HDL Ratio: 5
Triglycerides: 103 mg/dL (ref 0.0–149.0)
VLDL: 20.6 mg/dL (ref 0.0–40.0)

## 2013-04-07 ENCOUNTER — Other Ambulatory Visit: Payer: Medicare Other

## 2013-04-10 ENCOUNTER — Other Ambulatory Visit: Payer: Self-pay | Admitting: Internal Medicine

## 2013-04-12 ENCOUNTER — Encounter: Payer: Self-pay | Admitting: *Deleted

## 2013-04-24 ENCOUNTER — Other Ambulatory Visit: Payer: Self-pay | Admitting: Internal Medicine

## 2013-04-26 NOTE — Telephone Encounter (Signed)
Atorvastatin refilled per protocol. JG//CMA 

## 2013-05-04 ENCOUNTER — Encounter: Payer: Self-pay | Admitting: Cardiology

## 2013-05-04 ENCOUNTER — Ambulatory Visit (HOSPITAL_COMMUNITY): Payer: Medicare Other | Attending: Cardiology

## 2013-05-04 DIAGNOSIS — I739 Peripheral vascular disease, unspecified: Secondary | ICD-10-CM

## 2013-05-04 DIAGNOSIS — J4489 Other specified chronic obstructive pulmonary disease: Secondary | ICD-10-CM | POA: Insufficient documentation

## 2013-05-04 DIAGNOSIS — I658 Occlusion and stenosis of other precerebral arteries: Secondary | ICD-10-CM | POA: Insufficient documentation

## 2013-05-04 DIAGNOSIS — J449 Chronic obstructive pulmonary disease, unspecified: Secondary | ICD-10-CM | POA: Insufficient documentation

## 2013-05-04 DIAGNOSIS — I6529 Occlusion and stenosis of unspecified carotid artery: Secondary | ICD-10-CM

## 2013-05-04 DIAGNOSIS — I779 Disorder of arteries and arterioles, unspecified: Secondary | ICD-10-CM

## 2013-05-04 DIAGNOSIS — F17200 Nicotine dependence, unspecified, uncomplicated: Secondary | ICD-10-CM | POA: Insufficient documentation

## 2013-06-17 ENCOUNTER — Telehealth: Payer: Self-pay | Admitting: General Practice

## 2013-06-17 MED ORDER — AZITHROMYCIN 250 MG PO TABS: ORAL_TABLET | ORAL | Status: DC

## 2013-06-17 NOTE — Telephone Encounter (Signed)
Message on Triage Line: Pt left message on triage line in regards to not feeling well. Pt was called back and advised that she would need an appointment to be seen. Pt declined stating that she lives in [place] and the secondary roads are to bad for her to travel to the office.   Pt states that she has "Bronchitis", chest/nasal congestion that the mucinex is not clearing up. Denies any fever. Please advise.

## 2013-06-17 NOTE — Telephone Encounter (Signed)
Pt notified and z-pak filled.

## 2013-06-17 NOTE — Telephone Encounter (Signed)
Recommend: For cough, take Mucinex DM twice a day as needed  For congestion use OTC Nasocort: 2 nasal sprays on each side of the nose daily until you feel better Call a zpack , ask to pt get it only if no better in few days. Call if sx severe

## 2013-07-26 ENCOUNTER — Other Ambulatory Visit: Payer: Self-pay | Admitting: Internal Medicine

## 2013-09-07 ENCOUNTER — Telehealth: Payer: Self-pay | Admitting: Internal Medicine

## 2013-09-07 NOTE — Telephone Encounter (Signed)
DEXA 03-2012 T score - 2.0, better than before  Cost is an issue, self d/c actonel.  Plan is  to recheck a bone density test approximately 03/2014

## 2013-09-07 NOTE — Telephone Encounter (Signed)
Spoke with patient who states that she didn't remember having Bone Density last year but notes indicate that she did and was reviewed with her.   Should the patient be taking Actonel in addition to the Calcium and 2000iu of Vitamin D? Notes from her Wellness Exam in 03/2013 note that the patient had self discontinued due to cost.  Please advise.

## 2013-09-07 NOTE — Telephone Encounter (Signed)
Caller name: Tyliyah Relation to pt: self  Call back number:   [phone] Pharmacy:  Reason for call:  The House Call Nurse from [facility] told pt that she needed another bone density test done.  House Call Nurse told pt that they would send over information to PCP.

## 2013-09-07 NOTE — Telephone Encounter (Signed)
Left voice mail for patient with information given

## 2013-09-16 LAB — HM MAMMOGRAPHY: HM MAMMO: NEGATIVE

## 2013-10-08 ENCOUNTER — Encounter: Payer: Self-pay | Admitting: Internal Medicine

## 2013-10-13 ENCOUNTER — Encounter: Payer: Self-pay | Admitting: Internal Medicine

## 2013-12-19 ENCOUNTER — Other Ambulatory Visit: Payer: Self-pay | Admitting: Internal Medicine

## 2014-01-10 ENCOUNTER — Encounter: Payer: Self-pay | Admitting: Gastroenterology

## 2014-01-18 ENCOUNTER — Other Ambulatory Visit: Payer: Self-pay

## 2014-01-18 MED ORDER — ATORVASTATIN CALCIUM 10 MG PO TABS: ORAL_TABLET | ORAL | Status: DC

## 2014-01-25 ENCOUNTER — Telehealth: Payer: Self-pay

## 2014-01-25 NOTE — Telephone Encounter (Signed)
LVM for pt to schedule CPE with Dr. Larose Kells

## 2014-02-04 ENCOUNTER — Encounter: Payer: Medicare Other | Admitting: Internal Medicine

## 2014-02-10 ENCOUNTER — Other Ambulatory Visit: Payer: Self-pay

## 2014-02-10 MED ORDER — LEVOTHYROXINE SODIUM 88 MCG PO TABS: ORAL_TABLET | ORAL | Status: DC

## 2014-03-25 ENCOUNTER — Other Ambulatory Visit: Payer: Self-pay | Admitting: Internal Medicine

## 2014-04-12 ENCOUNTER — Encounter: Payer: Self-pay | Admitting: Internal Medicine

## 2014-04-12 ENCOUNTER — Ambulatory Visit (HOSPITAL_BASED_OUTPATIENT_CLINIC_OR_DEPARTMENT_OTHER)
Admission: RE | Admit: 2014-04-12 | Discharge: 2014-04-12 | Disposition: A | Discharge status: Home / Self Care | Payer: Medicare Other | Source: Ambulatory Visit | Attending: Internal Medicine | Admitting: Internal Medicine

## 2014-04-12 ENCOUNTER — Ambulatory Visit (INDEPENDENT_AMBULATORY_CARE_PROVIDER_SITE_OTHER): Payer: Medicare Other | Admitting: Internal Medicine

## 2014-04-12 VITALS — BP 131/80 | HR 65 | Temp 98.0°F | Wt 146.4 lb

## 2014-04-12 DIAGNOSIS — E039 Hypothyroidism, unspecified: Secondary | ICD-10-CM

## 2014-04-12 DIAGNOSIS — J449 Chronic obstructive pulmonary disease, unspecified: Secondary | ICD-10-CM | POA: Diagnosis present

## 2014-04-12 DIAGNOSIS — Z23 Encounter for immunization: Secondary | ICD-10-CM

## 2014-04-12 DIAGNOSIS — E785 Hyperlipidemia, unspecified: Secondary | ICD-10-CM

## 2014-04-12 DIAGNOSIS — F32A Depression, unspecified: Secondary | ICD-10-CM

## 2014-04-12 DIAGNOSIS — M810 Age-related osteoporosis without current pathological fracture: Secondary | ICD-10-CM

## 2014-04-12 DIAGNOSIS — Z0000 Encounter for general adult medical examination without abnormal findings: Secondary | ICD-10-CM

## 2014-04-12 DIAGNOSIS — F418 Other specified anxiety disorders: Secondary | ICD-10-CM

## 2014-04-12 DIAGNOSIS — F329 Major depressive disorder, single episode, unspecified: Secondary | ICD-10-CM

## 2014-04-12 DIAGNOSIS — F419 Anxiety disorder, unspecified: Secondary | ICD-10-CM

## 2014-04-12 LAB — CBC WITH DIFFERENTIAL/PLATELET
Basophils Absolute: 0 10*3/uL (ref 0.0–0.1)
Basophils Relative: 0.5 % (ref 0.0–3.0)
Eosinophils Absolute: 0.1 10*3/uL (ref 0.0–0.7)
Eosinophils Relative: 1.3 % (ref 0.0–5.0)
HEMATOCRIT: 42.3 % (ref 36.0–46.0)
Hemoglobin: 13.9 g/dL (ref 12.0–15.0)
Lymphocytes Relative: 23.2 % (ref 12.0–46.0)
Lymphs Abs: 2.1 10*3/uL (ref 0.7–4.0)
MCHC: 32.9 g/dL (ref 30.0–36.0)
MCV: 91.8 fl (ref 78.0–100.0)
MONOS PCT: 3.1 % (ref 3.0–12.0)
Monocytes Absolute: 0.3 10*3/uL (ref 0.1–1.0)
Neutro Abs: 6.4 10*3/uL (ref 1.4–7.7)
Neutrophils Relative %: 71.9 % (ref 43.0–77.0)
PLATELETS: 270 10*3/uL (ref 150.0–400.0)
RBC: 4.61 Mil/uL (ref 3.87–5.11)
RDW: 14.4 % (ref 11.5–15.5)
WBC: 8.9 10*3/uL (ref 4.0–10.5)

## 2014-04-12 LAB — LIPID PANEL
CHOL/HDL RATIO: 5
Cholesterol: 178 mg/dL (ref 0–200)
HDL: 39.2 mg/dL (ref >39.00)
LDL Cholesterol: 119 mg/dL — ABNORMAL HIGH (ref 0–99)
NONHDL: 138.8
TRIGLYCERIDES: 99 mg/dL (ref 0.0–149.0)
VLDL: 19.8 mg/dL (ref 0.0–40.0)

## 2014-04-12 LAB — AST: AST: 20 U/L (ref 0–37)

## 2014-04-12 LAB — ALT: ALT: 18 U/L (ref 0–35)

## 2014-04-12 MED ORDER — LORAZEPAM 0.5 MG PO TABS: 0.5000 mg | ORAL_TABLET | Freq: Two times a day (BID) | ORAL | Status: DC | PRN

## 2014-04-12 NOTE — Assessment & Plan Note (Signed)
Td  2008 Had a Pneumonia shot  prevnar today  zostavax --  rx was provided , never did get it ($) Had a flu shot    --cervical cancer screening, see previous entry:  PAP 03-2013 neg, next in 2-3 years  --MMG 08-2013 ---neg,  breast exam neg 2014 --had  a colonoscopy in 2003, again  05/2012--Dr Kaplan--nml--next due 2024  Counseled about diet-exercise-tobacco

## 2014-04-12 NOTE — Assessment & Plan Note (Signed)
doing well, request an Ativan prescription for as needed use. rx printed

## 2014-04-12 NOTE — Assessment & Plan Note (Signed)
Still smoking, counseled. Request a chest x-ray, she never had one, we'll schedule one. We also talk about possibly doing an lung cancer screening with a CT, cost is an issue to her

## 2014-04-12 NOTE — Progress Notes (Signed)
Pre visit review using our clinic review tool, if applicable. No additional management support is needed unless otherwise documented below in the visit note. 

## 2014-04-12 NOTE — Patient Instructions (Signed)
Get your blood work before you leave   Stop by the first floor and get the XR    Please come back to the office in 6 months  for a routine check up       Smoking Cessation Quitting smoking is important to your health and has many advantages. However, it is not always easy to quit since nicotine is a very addictive drug. Oftentimes, people try 3 times or more before being able to quit. This document explains the best ways for you to prepare to quit smoking. Quitting takes hard work and a lot of effort, but you can do it. ADVANTAGES OF QUITTING SMOKING  You will live longer, feel better, and live better.  Your body will feel the impact of quitting smoking almost immediately.  Within 20 minutes, blood pressure decreases. Your pulse returns to its normal level.  After 8 hours, carbon monoxide levels in the blood return to normal. Your oxygen level increases.  After 24 hours, the chance of having a heart attack starts to decrease. Your breath, hair, and body stop smelling like smoke.  After 48 hours, damaged nerve endings begin to recover. Your sense of taste and smell improve.  After 72 hours, the body is virtually free of nicotine. Your bronchial tubes relax and breathing becomes easier.  After 2 to 12 weeks, lungs can hold more air. Exercise becomes easier and circulation improves.  The risk of having a heart attack, stroke, cancer, or lung disease is greatly reduced.  After 1 year, the risk of coronary heart disease is cut in half.  After 5 years, the risk of stroke falls to the same as a nonsmoker.  After 10 years, the risk of lung cancer is cut in half and the risk of other cancers decreases significantly.  After 15 years, the risk of coronary heart disease drops, usually to the level of a nonsmoker.  If you are pregnant, quitting smoking will improve your chances of having a healthy baby.  The people you live with, especially any children, will be healthier.  You will  have extra money to spend on things other than cigarettes. QUESTIONS TO THINK ABOUT BEFORE ATTEMPTING TO QUIT You may want to talk about your answers with your health care provider.  Why do you want to quit?  If you tried to quit in the past, what helped and what did not?  What will be the most difficult situations for you after you quit? How will you plan to handle them?  Who can help you through the tough times? Your family? Friends? A health care provider?  What pleasures do you get from smoking? What ways can you still get pleasure if you quit? Here are some questions to ask your health care provider:  How can you help me to be successful at quitting?  What medicine do you think would be best for me and how should I take it?  What should I do if I need more help?  What is smoking withdrawal like? How can I get information on withdrawal? GET READY  Set a quit date.  Change your environment by getting rid of all cigarettes, ashtrays, matches, and lighters in your home, car, or work. Do not let people smoke in your home.  Review your past attempts to quit. Think about what worked and what did not. GET SUPPORT AND ENCOURAGEMENT You have a better chance of being successful if you have help. You can get support in many ways.  Tell your family, friends, and coworkers that you are going to quit and need their support. Ask them not to smoke around you.  Get individual, group, or telephone counseling and support. Programs are available at [employer] and health centers. Call your local health department for information about programs in your area.  Spiritual beliefs and practices may help some smokers quit.  Download a "quit meter" on your computer to keep track of quit statistics, such as how long you have gone without smoking, cigarettes not smoked, and money saved.  Get a self-help book about quitting smoking and staying off tobacco. [place] yourself from urges to smoke. Talk to someone, go for a walk, or occupy your time with a task.  Change your normal routine. Take a different route to work. Drink tea instead of coffee. Eat breakfast in a different place.  Reduce your stress. Take a hot bath, exercise, or read a book.  Plan something enjoyable to do every day. Reward yourself for not smoking.  Explore interactive web-based programs that specialize in helping you quit. GET MEDICINE AND USE IT CORRECTLY Medicines can help you stop smoking and decrease the urge to smoke. Combining medicine with the above behavioral methods and support can greatly increase your chances of successfully quitting smoking.  Nicotine replacement therapy helps deliver nicotine to your body without the negative effects and risks of smoking. Nicotine replacement therapy includes nicotine gum, lozenges, inhalers, nasal sprays, and skin patches. Some may be available over-the-counter and others require a prescription.  Antidepressant medicine helps people abstain from smoking, but how this works is unknown. This medicine is available by prescription.  Nicotinic receptor partial agonist medicine simulates the effect of nicotine in your brain. This medicine is available by prescription. Ask your health care provider for advice about which medicines to use and how to use them based on your health history. Your health care provider will tell you what side effects to look out for if you choose to be on a medicine or therapy. Carefully read the information on the package. Do not use any other product containing nicotine while using a nicotine replacement product.  RELAPSE OR DIFFICULT SITUATIONS Most relapses occur within the first 3 months after quitting. Do not be discouraged if you start smoking again. Remember, most people try several times before finally quitting. You may have symptoms of withdrawal because your body is used to nicotine. You  may crave cigarettes, be irritable, feel very hungry, cough often, get headaches, or have difficulty concentrating. The withdrawal symptoms are only temporary. They are strongest when you first quit, but they will go away within 10-14 days. To reduce the chances of relapse, try to:  Avoid drinking alcohol. Drinking lowers your chances of successfully quitting.  Reduce the amount of caffeine you consume. Once you quit smoking, the amount of caffeine in your body increases and can give you symptoms, such as a rapid heartbeat, sweating, and anxiety.  Avoid smokers because they can make you want to smoke.  Do not let weight gain distract you. Many smokers will gain weight when they quit, usually less than 10 pounds. Eat a healthy diet and stay active. You can always lose the weight gained after you quit.  Find ways to improve your mood other than smoking. FOR MORE INFORMATION  www.smokefree.gov  Document Released: 04/09/2001 Document Revised: 08/30/2013 Document Reviewed: 07/25/2011 [facility] Patient Information 2015 [place], [place]. This information is not intended to replace advice given  to you by your health care provider. Make sure you discuss any questions you have with your health care provider.   Fall Prevention and Home Safety Falls cause injuries and can affect all age groups. It is possible to use preventive measures to significantly decrease the likelihood of falls. There are many simple measures which can make your home safer and prevent falls. OUTDOORS  Repair cracks and edges of walkways and driveways.  Remove high doorway thresholds.  Trim shrubbery on the main path into your home.  Have good outside lighting.  Clear walkways of tools, rocks, debris, and clutter.  Check that handrails are not broken and are securely fastened. Both sides of steps should have handrails.  Have leaves, snow, and ice cleared regularly.  Use sand or salt on walkways during winter months.  In  the garage, clean up grease or oil spills. BATHROOM  Install night lights.  Install grab bars by the toilet and in the tub and shower.  Use non-skid mats or decals in the tub or shower.  Place a plastic non-slip stool in the shower to sit on, if needed.  Keep floors dry and clean up all water on the floor immediately.  Remove soap buildup in the tub or shower on a regular basis.  Secure bath mats with non-slip, double-sided rug tape.  Remove throw rugs and tripping hazards from the floors. BEDROOMS  Install night lights.  Make sure a bedside light is easy to reach.  Do not use oversized bedding.  Keep a telephone by your bedside.  Have a firm chair with side arms to use for getting dressed.  Remove throw rugs and tripping hazards from the floor. KITCHEN  Keep handles on pots and pans turned toward the center of the stove. Use back burners when possible.  Clean up spills quickly and allow time for drying.  Avoid walking on wet floors.  Avoid hot utensils and knives.  Position shelves so they are not too high or low.  Place commonly used objects within easy reach.  If necessary, use a sturdy step stool with a grab bar when reaching.  Keep electrical cables out of the way.  Do not use floor polish or wax that makes floors slippery. If you must use wax, use non-skid floor wax.  Remove throw rugs and tripping hazards from the floor. STAIRWAYS  Never leave objects on stairs.  Place handrails on both sides of stairways and use them. Fix any loose handrails. Make sure handrails on both sides of the stairways are as long as the stairs.  Check carpeting to make sure it is firmly attached along stairs. Make repairs to worn or loose carpet promptly.  Avoid placing throw rugs at the top or bottom of stairways, or properly secure the rug with carpet tape to prevent slippage. Get rid of throw rugs, if possible.  Have an electrician put in a light switch at the top and  bottom of the stairs. OTHER FALL PREVENTION TIPS  Wear low-heel or rubber-soled shoes that are supportive and fit well. Wear closed toe shoes.  When using a stepladder, make sure it is fully opened and both spreaders are firmly locked. Do not climb a closed stepladder.  Add color or contrast paint or tape to grab bars and handrails in your home. Place contrasting color strips on first and last steps.  Learn and use mobility aids as needed. Install an electrical emergency response system.  Turn on lights to avoid dark areas. Replace light  bulbs that burn out immediately. Get light switches that glow.  Arrange furniture to create clear pathways. Keep furniture in the same place.  Firmly attach carpet with non-skid or double-sided tape.  Eliminate uneven floor surfaces.  Select a carpet pattern that does not visually hide the edge of steps.  Be aware of all pets. OTHER HOME SAFETY TIPS  Set the water temperature for 120 F (48.8 C).  Keep emergency numbers on or near the telephone.  Keep smoke detectors on every level of the home and near sleeping areas. Document Released: 04/05/2002 Document Revised: 10/15/2011 Document Reviewed: 07/05/2011 [facility] Patient Information 2015 [place], [place]. This information is not intended to replace advice given to you by your health care provider. Make sure you discuss any questions you have with your health care provider.     Preventive Care for Adults  Ages 75 years and over  Blood pressure check.** / Every 1 to 2 years.  Lipid and cholesterol check.** / Every 5 years beginning at age 10 years.  Lung cancer screening. / Every year if you are aged 43-80 years and have a 30-pack-year history of smoking and currently smoke or have quit within the past 15 years. Yearly screening is stopped once you have quit smoking for at least 15 years or develop a health problem that would prevent you from having lung cancer treatment.  Clinical breast  exam.** / Every year after age 72 years.  BRCA-related cancer risk assessment.** / For women who have family members with a BRCA-related cancer (breast, ovarian, tubal, or peritoneal cancers).  Mammogram.** / Every year beginning at age 58 years and continuing for as long as you are in good health. Consult with your health care provider.  Pap test.** / Every 3 years starting at age 55 years through age 38 or 22 years with 3 consecutive normal Pap tests. Testing can be stopped between 65 and 70 years with 3 consecutive normal Pap tests and no abnormal Pap or HPV tests in the past 10 years.  HPV screening.** / Every 3 years from ages 29 years through ages 66 or 50 years with a history of 3 consecutive normal Pap tests. Testing can be stopped between 65 and 70 years with 3 consecutive normal Pap tests and no abnormal Pap or HPV tests in the past 10 years.  Fecal occult blood test (FOBT) of stool. / Every year beginning at age 70 years and continuing until age 55 years. You may not need to do this test if you get a colonoscopy every 10 years.  Flexible sigmoidoscopy or colonoscopy.** / Every 5 years for a flexible sigmoidoscopy or every 10 years for a colonoscopy beginning at age 76 years and continuing until age 45 years.  Hepatitis C blood test.** / For all people born from [birth year] through 1965 and any individual with known risks for hepatitis C.  Osteoporosis screening.** / A one-time screening for women ages 76 years and over and women at risk for fractures or osteoporosis.  Skin self-exam. / Monthly.  Influenza vaccine. / Every year.  Tetanus, diphtheria, and acellular pertussis (Tdap/Td) vaccine.** / 1 dose of Td every 10 years.  Varicella vaccine.** / Consult your health care provider.  Zoster vaccine.** / 1 dose for adults aged 56 years or older.  Pneumococcal 13-valent conjugate (PCV13) vaccine.** / Consult your health care provider.  Pneumococcal polysaccharide (PPSV23) vaccine.** /  1 dose for all adults aged 109 years and older.  Meningococcal vaccine.** / Consult your health care  provider.  Hepatitis A vaccine.** / Consult your health care provider.  Hepatitis B vaccine.** / Consult your health care provider.  Haemophilus influenzae type b (Hib) vaccine.** / Consult your health care provider. ** Family history and personal history of risk and conditions may change your health care provider's recommendations. Document Released: 06/11/2001 Document Revised: 08/30/2013 Document Reviewed: 09/10/2010 [facility] Patient Information 2015 [place], [place]. This information is not intended to replace advice given to you by your health care provider. Make sure you discuss any questions you have with your health care provider.

## 2014-04-12 NOTE — Progress Notes (Signed)
Subjective:    Patient ID: Meagan Holmes, female    DOB: Nov 11, 1940, 73 y.o.   MRN: [id number]  DOS:  04/12/2014 Type of visit - description :   Here for Medicare AWV:  1. Risk factors based on Past M, S, F history: reviewed  2. Physical Activities: Active at home , gym x 4/week, feels great 3. Depression/mood:   depression screen (-) , some anxiety, rarely needs ativan , needs a rx 4. Hearing: someproblems reported , had a hearing test last year was ok, no problems noted today, rec observation 5. ADL's: Independent , drives 6. Fall Risk: low risk, see instructions  7. home Safety: does feel safe at home  8. Height, weight, &visual acuity: see VS, eyes cheked yearly, uses reading  Glasses, saw the eye doctor q year  2. Counseling: provided  10. Labs ordered based on risk factors: if needed  11. Referral Coordination: if needed  12. Care Plan, see assessment and plan  13. Cognitive Assessment: cognition and motor appropriate for age 79. Care team updated 15. Written instructions provided   In addition, today we discussed the following: High cholesterol, good compliance with Lipitor Hypothyroidism, on Synthroid, due for labs COPD, essentially asymptomatic, still smoking, wonders if she could have chest x-ray Osteopenia, on calcium and vitamin D, she has become more active, going to the gym.  ROS Denies chest pain or difficulty breathing No nausea, vomiting, diarrhea or blood in the stools No cough, sputum production or hemoptysis No dysuria, gross hematuria or difficulty urinating  Past Medical History  Diagnosis Date  . Allergic rhinitis     a lot better after septoplasty- remote  . Hypothyroidism   . Osteopenia   . Hematuria 2004    (-) CT abd-pelvis (-) cystoscopy  . COPD (chronic obstructive pulmonary disease) 10/02/2010  . Anxiety and depression 02/10/2007    Past Surgical History  Procedure Laterality Date  . Appendectomy    . Thyroidectomy    .  Carpal tunnel release    . Nasal septum surgery      History   Social History  . Marital Status: Married    Spouse Name: N/A    Number of Children: 1  . Years of Education: N/A   Occupational History  . retired    Social History Main Topics  . Smoking status: Current Every Day Smoker -- 0.50 packs/day    Types: Cigarettes    Start date: 04/29/2012  . Smokeless tobacco: Never Used     Comment: 1/2 ppd  . Alcohol Use: No  . Drug Use: No  . Sexual Activity: Not on file   Other Topics Concern  . Not on file   Social History Narrative   Lost son (to drugs) 2008     Husband ill, she takes care of him, raising her g-daughter                      Family History  Problem Relation Age of Onset  . Breast cancer Neg Hx   . Colon cancer Other     uncle, age?  [name] Heart attack Neg Hx   . Cancer Neg Hx   . Heart disease Neg Hx   . Diabetes Mother   . Hypertension Mother       Medication List       This list is accurate as of: 04/12/14  6:49 PM.  Always use your most recent med list.  aspirin 81 MG tablet  Take 81 mg by mouth daily.     atorvastatin 10 MG tablet  Commonly known as:  LIPITOR  TAKE 1 TABLET EVERY DAY     calcium gluconate 500 MG tablet  Take 500 mg by mouth daily.     CINNAMON PO  Take 1 tablet by mouth daily.     levothyroxine 88 MCG tablet  Commonly known as:  SYNTHROID, LEVOTHROID  TAKE 1 TABLET BY MOUTH EVERY DAY     LORazepam 0.5 MG tablet  Commonly known as:  ATIVAN  Take 1 tablet (0.5 mg total) by mouth 2 (two) times daily as needed for anxiety.     meloxicam 15 MG tablet  Commonly known as:  MOBIC  Take 1 tablet (15 mg total) by mouth daily.     VITAMIN D3 PO  Take by mouth.           Objective:   Physical Exam BP 131/80 mmHg  Pulse 65  Temp(Src) 98 F (36.7 C) (Oral)  Wt 146 lb 6 oz (66.395 kg)  SpO2 98% General -- alert, well-developed, NAD.  Neck --no thyromegaly , normal carotid pulse  HEENT--  Not pale. Lungs -- normal respiratory effort, no intercostal retractions, no accessory muscle use, and normal breath sounds.  Heart-- normal rate, regular rhythm, no murmur.  Abdomen-- Not distended, good bowel sounds,soft, non-tender. Extremities-- no pretibial edema bilaterally  Neurologic--  alert & oriented X3. Speech normal, gait appropriate for age, strength symmetric and appropriate for age.  Psych-- Cognition and judgment appear intact. Cooperative with normal attention span and concentration. No anxious or depressed appearing.      Assessment & Plan:   Hypothyroidism, continue with Synthroid, check a TSH Hyperlipidemia, continue with Lipitor, check a cholesterol panel. No apparent side effects

## 2014-04-12 NOTE — Assessment & Plan Note (Signed)
Osteoporosis DEXA 03-2012 T score - 2.0, better than before  took Actonel for 3 or 4 years approximately , off medicines since 2013. Plan-- dexa , continue calcium/vitamin D/exercise

## 2014-04-13 LAB — TSH: TSH: 0.3 u[IU]/mL — ABNORMAL LOW (ref 0.35–4.50)

## 2014-04-14 MED ORDER — LEVOTHYROXINE SODIUM 75 MCG PO TABS: 75.0000 ug | ORAL_TABLET | Freq: Every day | ORAL | Status: DC

## 2014-04-14 NOTE — Addendum Note (Signed)
Addended by: Wilfrid Lund on: 04/14/2014 03:02 PM   Modules accepted: Orders, Medications

## 2014-05-10 ENCOUNTER — Ambulatory Visit: Payer: Medicare Other | Admitting: Family

## 2014-05-13 ENCOUNTER — Ambulatory Visit (INDEPENDENT_AMBULATORY_CARE_PROVIDER_SITE_OTHER): Payer: Medicare Other | Admitting: Family

## 2014-05-13 ENCOUNTER — Other Ambulatory Visit: Payer: Medicare Other

## 2014-05-13 ENCOUNTER — Encounter: Payer: Self-pay | Admitting: Family

## 2014-05-13 VITALS — BP 120/70 | HR 63 | Temp 97.5°F | Resp 18 | Wt 147.6 lb

## 2014-05-13 DIAGNOSIS — E039 Hypothyroidism, unspecified: Secondary | ICD-10-CM | POA: Diagnosis not present

## 2014-05-13 DIAGNOSIS — R350 Frequency of micturition: Secondary | ICD-10-CM

## 2014-05-13 LAB — POCT URINALYSIS DIPSTICK
Bilirubin, UA: NEGATIVE
Glucose, UA: NEGATIVE
KETONES UA: NEGATIVE
Nitrite, UA: NEGATIVE
PH UA: 6
Protein, UA: NEGATIVE
SPEC GRAV UA: 1.02
Urobilinogen, UA: NEGATIVE

## 2014-05-13 MED ORDER — LORAZEPAM 0.5 MG PO TABS: 0.5000 mg | ORAL_TABLET | Freq: Two times a day (BID) | ORAL | Status: DC | PRN

## 2014-05-13 MED ORDER — CIPROFLOXACIN HCL 250 MG PO TABS: 250.0000 mg | ORAL_TABLET | Freq: Two times a day (BID) | ORAL | Status: DC

## 2014-05-13 NOTE — Assessment & Plan Note (Signed)
Dose of levothyroxine was recently lowered from 88 g to 75 g. Denies any endocrine or thyroid related symptoms. Obtain TSH in approximately 2 weeks to determine effectiveness of dose change.

## 2014-05-13 NOTE — Progress Notes (Signed)
Pre visit review using our clinic review tool, if applicable. No additional management support is needed unless otherwise documented below in the visit note. 

## 2014-05-13 NOTE — Assessment & Plan Note (Addendum)
POCT urinalysis showed small amount of leukocytes and some blood. Start ciprofloxacin. Information regarding urinary tract infection care provided to patient. Follow-up if symptoms worsen or fail to improve.

## 2014-05-13 NOTE — Progress Notes (Signed)
   Subjective:    Patient ID: Meagan Holmes, female    DOB: Sep 03, 1940, 74 y.o.   MRN: [id number]   Chief Complaint  Patient presents with  . Establish Care    Urinary frequency and the hypothyroidism    HPI:  Meagan Holmes is a 74 y.o. female who presents today to establish care and discuss urinary frequency and her hypothyroidism.   1) Urine odor - has had frequency and urgency which has been going on for about 3-4 months. States she drinks a considerable amount of sodas and tea. Has not tried increasing fluid, cranberry juice or OTC medications. Denies cosotvertebral tenderness, fevers or chills.   2) Hypothyroidism - previously reduced dose of levothyroxine about 4 weeks ago.  Lab Results  Component Value Date   TSH 0.30* 04/12/2014    No Known Allergies  Current Outpatient Prescriptions on File Prior to Visit  Medication Sig Dispense Refill  . aspirin 81 MG tablet Take 81 mg by mouth daily.    [name] atorvastatin (LIPITOR) 10 MG tablet TAKE 1 TABLET EVERY DAY 30 tablet 2  . calcium gluconate 500 MG tablet Take 500 mg by mouth daily.      . Cholecalciferol (VITAMIN D3 PO) Take by mouth.    . levothyroxine (SYNTHROID, LEVOTHROID) 75 MCG tablet Take 1 tablet (75 mcg total) by mouth daily. 30 tablet 2   No current facility-administered medications on file prior to visit.    Review of Systems  Constitutional: Negative for fever.  Endocrine: Negative for cold intolerance and heat intolerance.  Genitourinary: Positive for urgency and frequency. Negative for dysuria and flank pain.      Objective:    BP 120/70 mmHg  Pulse 63  Temp(Src) 97.5 F (36.4 C) (Oral)  Resp 18  Wt 147 lb 9.6 oz (66.951 kg)  SpO2 97% Nursing note and vital signs reviewed.  Physical Exam  Constitutional: She is oriented to person, place, and time. She appears well-developed and well-nourished. No distress.  Cardiovascular: Normal rate, regular rhythm, normal heart sounds and intact distal  pulses.   Pulmonary/Chest: Effort normal and breath sounds normal.  Abdominal: There is no CVA tenderness.  Neurological: She is alert and oriented to person, place, and time.  Skin: Skin is warm and dry.  Psychiatric: She has a normal mood and affect. Her behavior is normal. Judgment and thought content normal.       Assessment & Plan:

## 2014-05-13 NOTE — Patient Instructions (Signed)
Thank you for choosing [facility].  Summary/Instructions:  Please stop by the lab in during the 1st week in February to have your TSH drawn.   Your prescription(s) have been submitted to your pharmacy or been printed and provided for you. Please take as directed and contact our office if you believe you are having problem(s) with the medication(s) or have any questions.  Please stop by the lab on the basement level of the building for your blood work. Your results will be released to [place] (or called to you) after review, usually within 72 hours after test completion. If any changes need to be made, you will be notified at that same time.  If your symptoms worsen or fail to improve, please contact our office for further instruction, or in case of emergency go directly to the emergency room at the closest medical facility.    Urinary Tract Infection Urinary tract infections (UTIs) can develop anywhere along your urinary tract. Your urinary tract is your body's drainage system for removing wastes and extra water. Your urinary tract includes two kidneys, two ureters, a bladder, and a urethra. Your kidneys are a pair of bean-shaped organs. Each kidney is about the size of your fist. They are located below your ribs, one on each side of your spine. CAUSES Infections are caused by microbes, which are microscopic organisms, including fungi, viruses, and bacteria. These organisms are so small that they can only be seen through a microscope. Bacteria are the microbes that most commonly cause UTIs. SYMPTOMS  Symptoms of UTIs may vary by age and gender of the patient and by the location of the infection. Symptoms in young women typically include a frequent and intense urge to urinate and a painful, burning feeling in the bladder or urethra during urination. Older women and men are more likely to be tired, shaky, and weak and have muscle aches and abdominal pain. A fever may mean the infection is in  your kidneys. Other symptoms of a kidney infection include pain in your back or sides below the ribs, nausea, and vomiting. DIAGNOSIS To diagnose a UTI, your caregiver will ask you about your symptoms. Your caregiver also will ask to provide a urine sample. The urine sample will be tested for bacteria and white blood cells. White blood cells are made by your body to help fight infection. TREATMENT  Typically, UTIs can be treated with medication. Because most UTIs are caused by a bacterial infection, they usually can be treated with the use of antibiotics. The choice of antibiotic and length of treatment depend on your symptoms and the type of bacteria causing your infection. HOME CARE INSTRUCTIONS  If you were prescribed antibiotics, take them exactly as your caregiver instructs you. Finish the medication even if you feel better after you have only taken some of the medication.  Drink enough water and fluids to keep your urine clear or pale yellow.  Avoid caffeine, tea, and carbonated beverages. They tend to irritate your bladder.  Empty your bladder often. Avoid holding urine for long periods of time.  Empty your bladder before and after sexual intercourse.  After a bowel movement, women should cleanse from front to back. Use each tissue only once. SEEK MEDICAL CARE IF:   You have back pain.  You develop a fever.  Your symptoms do not begin to resolve within 3 days. SEEK IMMEDIATE MEDICAL CARE IF:   You have severe back pain or lower abdominal pain.  You develop chills.  You  have nausea or vomiting.  You have continued burning or discomfort with urination. MAKE SURE YOU:   Understand these instructions.  Will watch your condition.  Will get help right away if you are not doing well or get worse. Document Released: 01/23/2005 Document Revised: 10/15/2011 Document Reviewed: 05/24/2011 [facility] Patient Information 2015 [place], [place]. This information is not intended to  replace advice given to you by your health care provider. Make sure you discuss any questions you have with your health care provider.

## 2014-05-15 LAB — URINE CULTURE
COLONY COUNT: NO GROWTH
ORGANISM ID, BACTERIA: NO GROWTH

## 2014-05-21 ENCOUNTER — Other Ambulatory Visit: Payer: Self-pay | Admitting: Internal Medicine

## 2014-05-21 DIAGNOSIS — I779 Disorder of arteries and arterioles, unspecified: Secondary | ICD-10-CM | POA: Insufficient documentation

## 2014-05-21 DIAGNOSIS — I739 Peripheral vascular disease, unspecified: Principal | ICD-10-CM

## 2014-05-21 HISTORY — DX: Disorder of arteries and arterioles, unspecified: I77.9

## 2014-05-26 ENCOUNTER — Other Ambulatory Visit: Payer: Medicare Other

## 2014-06-08 ENCOUNTER — Other Ambulatory Visit (INDEPENDENT_AMBULATORY_CARE_PROVIDER_SITE_OTHER): Payer: Medicare Other

## 2014-06-08 ENCOUNTER — Other Ambulatory Visit: Payer: Medicare Other

## 2014-06-08 ENCOUNTER — Telehealth: Payer: Self-pay | Admitting: Internal Medicine

## 2014-06-08 ENCOUNTER — Ambulatory Visit (HOSPITAL_COMMUNITY): Payer: Medicare Other | Attending: Internal Medicine | Admitting: Cardiology

## 2014-06-08 DIAGNOSIS — I25709 Atherosclerosis of coronary artery bypass graft(s), unspecified, with unspecified angina pectoris: Secondary | ICD-10-CM

## 2014-06-08 DIAGNOSIS — E039 Hypothyroidism, unspecified: Secondary | ICD-10-CM

## 2014-06-08 DIAGNOSIS — I739 Peripheral vascular disease, unspecified: Secondary | ICD-10-CM

## 2014-06-08 DIAGNOSIS — I779 Disorder of arteries and arterioles, unspecified: Secondary | ICD-10-CM | POA: Diagnosis not present

## 2014-06-08 DIAGNOSIS — I6523 Occlusion and stenosis of bilateral carotid arteries: Secondary | ICD-10-CM | POA: Diagnosis not present

## 2014-06-08 LAB — TSH: TSH: 0.62 u[IU]/mL (ref 0.35–4.50)

## 2014-06-08 NOTE — Telephone Encounter (Signed)
Carotid ultrasound: Right side is stable 1-39% Left side carotid disease increased from 40-59% to 60-79% Needs better cholesterol control, increase Lipitor from 10 to 40 mg daily, send a new prescription, labs when she comes back in 5 months Continuing aspirin Refer to cardiology, DX carotid artery disease

## 2014-06-08 NOTE — Progress Notes (Signed)
Carotid duplex performed 

## 2014-06-09 ENCOUNTER — Telehealth: Payer: Self-pay | Admitting: Family

## 2014-06-09 MED ORDER — ATORVASTATIN CALCIUM 40 MG PO TABS: 40.0000 mg | ORAL_TABLET | Freq: Every day | ORAL | Status: DC

## 2014-06-09 NOTE — Telephone Encounter (Signed)
Spoke with Pt, informed her of carotid ultrasound results. Instructed her to increase Lipitor to 40 mg 1 tablet daily and that Dr. Larose Kells would like for her to see cardiology. Pt verbalized understanding and requested Lipitor be sent to [facility].

## 2014-06-09 NOTE — Telephone Encounter (Signed)
Please inform patient her thyroid level was 0.62 which is within the normal ranges. Therefore no medication changes are needed at this time. We can plan to follow up in about 6 months to recheck or sooner if needed.

## 2014-06-09 NOTE — Telephone Encounter (Signed)
Called pt and left message for her to call back.  

## 2014-06-10 NOTE — Telephone Encounter (Signed)
Tried calling pt again. No answer. Left message for her to call back

## 2014-06-13 NOTE — Telephone Encounter (Signed)
Pt informed of results.

## 2014-06-13 NOTE — Telephone Encounter (Signed)
Pt return your call, please call her back [phone].

## 2014-07-06 ENCOUNTER — Encounter: Payer: Self-pay | Admitting: Cardiology

## 2014-07-06 ENCOUNTER — Ambulatory Visit (INDEPENDENT_AMBULATORY_CARE_PROVIDER_SITE_OTHER): Payer: Medicare Other | Admitting: Cardiology

## 2014-07-06 VITALS — BP 118/72 | HR 67 | Ht 63.0 in | Wt 153.2 lb

## 2014-07-06 DIAGNOSIS — I779 Disorder of arteries and arterioles, unspecified: Secondary | ICD-10-CM

## 2014-07-06 DIAGNOSIS — E785 Hyperlipidemia, unspecified: Secondary | ICD-10-CM

## 2014-07-06 DIAGNOSIS — R011 Cardiac murmur, unspecified: Secondary | ICD-10-CM

## 2014-07-06 DIAGNOSIS — I739 Peripheral vascular disease, unspecified: Secondary | ICD-10-CM

## 2014-07-06 HISTORY — DX: Cardiac murmur, unspecified: R01.1

## 2014-07-06 MED ORDER — ATORVASTATIN CALCIUM 80 MG PO TABS: 80.0000 mg | ORAL_TABLET | Freq: Every day | ORAL | Status: DC

## 2014-07-06 NOTE — Progress Notes (Signed)
Cardiology Office Note   Date:  07/06/2014   ID:  Meagan Holmes, DOB Jan 07, 1941, MRN [id number]  PCP:  [name], FNP  Cardiologist:    [name], MD   No chief complaint on file.     History of Present Illness: Meagan Holmes is a 74 y.o. female who presents for new patient evaluation at the request of Dr. [name] regarding abnormal carotid duplex ultrasound.  The patient is a former smoker.  She quit smoking 3 weeks ago.  He has had known carotid bruits and has had recent carotid duplex ultrasound.  Her most recent carotid duplex on 06/08/14 showed an increase in the degree of stenosis in the left carotid artery.  Whereas previously it had been 40-59%, this time it was 60-79%.  The right internal carotid artery was stable with a mild stenosis between 1 and 39%.  The patient has not had any symptoms of TIAs such as scintillating scotomata or transient speech disturbance or motor weakness.  She does not have any history of ischemic heart disease.  She has not been expressing any chest pain or left arm discomfort.  She does have some exertional dyspnea.  This may have been from her previous cigarette habit.  The patient has tried to stay more healthy recently.  She drops her great-granddaughter off at the childcare facility and then goes to [employer] 5 days a week at 7:30 AM to exercise. The patient is retired.  She retired in 2004.  She previously worked in [employer], educational. Patient is not diabetic.  She does have a history of hypercholesterolemia and her most recent LDL was 119 in December 2015.  She has not been on a low cholesterol diet.  She eats a lot of cheese and meat products.  She has been on Lipitor 40 mg daily. Her family history reveals that her father died at age 32 of a heart attack.  Mother died at 67 of complications from diabetes.    Past Medical History  Diagnosis Date  . Allergic rhinitis     a lot better after septoplasty- remote  . Hypothyroidism   .  Osteopenia   . Hematuria 2004    (-) CT abd-pelvis (-) cystoscopy  . COPD (chronic obstructive pulmonary disease) 10/02/2010  . Anxiety and depression 02/10/2007    Past Surgical History  Procedure Laterality Date  . Appendectomy    . Thyroidectomy    . Carpal tunnel release    . Nasal septum surgery       Current Outpatient Prescriptions  Medication Sig Dispense Refill  . aspirin 81 MG tablet Take 81 mg by mouth daily.    [name] atorvastatin (LIPITOR) 80 MG tablet Take 1 tablet (80 mg total) by mouth daily. 30 tablet 5  . calcium gluconate 500 MG tablet Take 500 mg by mouth daily.      . Cholecalciferol (VITAMIN D3 PO) Take by mouth.    . levothyroxine (SYNTHROID, LEVOTHROID) 75 MCG tablet Take 1 tablet (75 mcg total) by mouth daily. 30 tablet 2  . LORazepam (ATIVAN) 0.5 MG tablet Take 1 tablet (0.5 mg total) by mouth 2 (two) times daily as needed for anxiety. 20 tablet 0   No current facility-administered medications for this visit.    Allergies:   Review of patient's allergies indicates no known allergies.    Social History:  The patient  reports that she has been smoking Cigarettes.  She started smoking about 2 years ago. She has a  25 pack-year smoking history. She has never used smokeless tobacco. She reports that she does not drink alcohol or use illicit drugs.   Family History:  The patient's family history includes Colon cancer in her other; Diabetes in her mother; Hypertension in her mother. There is no history of Breast cancer, Heart attack, Cancer, or Heart disease.    ROS:  Please see the history of present illness.   Otherwise, review of systems are positive for none.   All other systems are reviewed and negative.    PHYSICAL EXAM: VS:  BP 118/72 mmHg  Pulse 67  Ht 5\' 3"  (1.6 m)  Wt 153 lb 3.2 oz (69.491 kg)  BMI 27.14 kg/m2 , BMI Body mass index is 27.14 kg/(m^2). GEN: Well nourished, well developed, in no acute distress HEENT: normal Neck: Soft left carotid  bruit. Cardiac: RRR; no, rubs, or gallops,no edema .  There is a soft systolic murmur at the base. Respiratory:  clear to auscultation bilaterally, normal work of breathing GI: soft, nontender, nondistended, + BS MS: no deformity or atrophy Skin: warm and dry, no rash Neuro:  Strength and sensation are intact Psych: euthymic mood, full affect   EKG:  EKG is ordered today. The ekg ordered today demonstrates normal sinus rhythm and is within normal limits   Recent Labs: 04/12/2014: ALT 18; Hemoglobin 13.9; Platelets 270.0 06/08/2014: TSH 0.62    Lipid Panel    Component Value Date/Time   CHOL 178 04/12/2014 1128   TRIG 99.0 04/12/2014 1128   TRIG 83 02/25/2006 1312   HDL 39.20 04/12/2014 1128   CHOLHDL 5 04/12/2014 1128   CHOLHDL 5.5 CALC 02/25/2006 1312   VLDL 19.8 04/12/2014 1128   LDLCALC 119* 04/12/2014 1128   LDLDIRECT 171.2 03/19/2012 1129   LDLDIRECT 156.8 02/25/2006 1312      Wt Readings from Last 3 Encounters:  07/06/14 153 lb 3.2 oz (69.491 kg)  05/13/14 147 lb 9.6 oz (66.951 kg)  04/12/14 146 lb 6 oz (66.395 kg)        ASSESSMENT AND PLAN:  1.  Bilateral carotid artery stenosis.  Left internal carotid artery is more severe than the right.  Neither requires surgery at this time.  We need to intensify her medical therapy to prevent plaque progression. 2.  Systolic heart murmur 3.  Hypercholesterolemia 4.  Former tobacco use   Current medicines are reviewed at length with the patient today.  The patient does not have concerns regarding medicines.  The following changes have been made:  We will increase her Lipitor dose up to 80 mg daily in an effort to achieve an improved LDL level.  We certainly want it less than 100 and preferably less than 70.  We also gave her a she has been eating a lot of high cholesterol foods up until now.  Labs/ tests ordered today include: 2-D echo   Orders Placed This Encounter  Procedures  . Basic metabolic panel  . Lipid  panel  . Hepatic function panel  . EKG 12-Lead  . 2D Echocardiogram without contrast     Disposition:   FU with Dr. Mare Ferrari in 3 months for office visit lipid panel hepatic function panel and basal metabolic panel.  Continue to remain a nonsmoker.   Signed, [name], MD  07/06/2014 1:40 PM    [facility] [address], [place], [place]  [address] Phone: [phone]; Fax: [phone]

## 2014-07-06 NOTE — Patient Instructions (Signed)
INCREASE YOUR LIPITOR TO 80 MG DAILY (TAKE 2 OF YOUR 40 MG UNTIL YOU FINISH YOUR CURRENT BOTTLE. A NEW RX FOR THE 80 MG TABLETS HAVE BEEN SENT TO THE PHARMACY)   Your physician has requested that you have an echocardiogram. Echocardiography is a painless test that uses sound waves to create images of your heart. It provides your doctor with information about the size and shape of your heart and how well your heart's chambers and valves are working. This procedure takes approximately one hour. There are no restrictions for this procedure.  Your physician recommends that you schedule a follow-up appointment in: 3 months with fasting labs (LP/BMET/HFP)   Fat and Cholesterol Control Diet Your diet has an affect on your fat and cholesterol levels in your blood and organs. Too much fat and cholesterol in your blood can affect your:  Heart.  Blood vessels (arteries, veins).  Gallbladder.  Liver.  Pancreas. CONTROL FAT AND CHOLESTEROL WITH DIET Certain foods raise cholesterol and others lower it. It is important to replace bad fats with other types of fat.  Do not eat:  Fatty meats, such as hot dogs and salami.  Stick margarine and some tub margarines that have "partially hydrogenated oils" in them.  Baked goods, such as cookies and crackers that have "partially hydrogenated oils" in them.  Saturated tropical oils, such as coconut and palm oil. Eat the following foods:  Round or loin cuts of red meat.  Chicken (without skin).  Fish.  Veal.  [place] breast.  Shellfish.  Fruit, such as apples.  Vegetables, such as broccoli, potatoes, and carrots.  Beans, peas, and lentils (legumes).  Grains, such as barley, rice, couscous, and bulgar wheat.  Pasta (without cream sauces). Look for foods that are nonfat, low in fat, and low in cholesterol.  FIND FOODS THAT ARE LOWER IN FAT AND CHOLESTEROL  Find foods with soluble fiber and plant sterols (phytosterol). You should eat 2  grams a day of these foods. These foods include:  Fruits.  Vegetables.  Whole grains.  Dried beans and peas.  Nuts and seeds.  Read package labels. Look for low-saturated fats, trans fat free, low-fat foods.  Choose cheese that have only 2 to 3 grams of saturated fat per ounce.  Use heart-healthy tub margarine that is free of trans fat or partially hydrogenated oil.  Avoid buying baked goods that have partially hydrogenated oils in them. Instead, buy baked goods made with whole grains (whole-wheat or whole oat flour). Avoid baked goods labeled with "flour" or "enriched flour."  Buy non-creamy canned soups with reduced salt and no added fats. PREPARING YOUR FOOD  Broil, bake, steam, or roast foods. Do not fry food.  Use non-stick cooking sprays.  Use lemon or herbs to flavor food instead of using butter or stick margarine.  Use nonfat yogurt, salsa, or low-fat dressings for salads. LOW-SATURATED FAT / LOW-FAT FOOD SUBSTITUTES  Meats / Saturated Fat (g)  Avoid: Steak, marbled (3 oz/85 g) / 11 g.  Choose: Steak, lean (3 oz/85 g) / 4 g.  Avoid: Hamburger (3 oz/85 g) / 7 g.  Choose: Hamburger, lean (3 oz/85 g) / 5 g.  Avoid: Ham (3 oz/85 g) / 6 g.  Choose: Ham, lean cut (3 oz/85 g) / 2.4 g.  Avoid: Chicken, with skin, dark meat (3 oz/85 g) / 4 g.  Choose: Chicken, skin removed, dark meat (3 oz/85 g) / 2 g.  Avoid: Chicken, with skin, light meat (3 oz/85  g) / 2.5 g.  Choose: Chicken, skin removed, light meat (3 oz/85 g) / 1 g. Dairy / Saturated Fat (g)  Avoid: Whole milk (1 cup) / 5 g.  Choose: Low-fat milk, 2% (1 cup) / 3 g.  Choose: Low-fat milk, 1% (1 cup) / 1.5 g.  Choose: Skim milk (1 cup) / 0.3 g.  Avoid: Hard cheese (1 oz/28 g) / 6 g.  Choose: Skim milk cheese (1 oz/28 g) / 2 to 3 g.  Avoid: Cottage cheese, 4% fat (1 cup) / 6.5 g.  Choose: Low-fat cottage cheese, 1% fat (1 cup) / 1.5 g.  Avoid: Ice cream (1 cup) / 9 g.  Choose: Sherbet (1 cup)  / 2.5 g.  Choose: Nonfat frozen yogurt (1 cup) / 0.3 g.  Choose: Frozen fruit bar / trace.  Avoid: Whipped cream (1 tbs) / 3.5 g.  Choose: Nondairy whipped topping (1 tbs) / 1 g. Condiments / Saturated Fat (g)  Avoid: Mayonnaise (1 tbs) / 2 g.  Choose: Low-fat mayonnaise (1 tbs) / 1 g.  Avoid: Butter (1 tbs) / 7 g.  Choose: Extra light margarine (1 tbs) / 1 g.  Avoid: Coconut oil (1 tbs) / 11.8 g.  Choose: Olive oil (1 tbs) / 1.8 g.  Choose: Corn oil (1 tbs) / 1.7 g.  Choose: Safflower oil (1 tbs) / 1.2 g.  Choose: Sunflower oil (1 tbs) / 1.4 g.  Choose: Soybean oil (1 tbs) / 2.4 g .  Choose: Canola oil (1 tbs) / 1 g. Document Released: 10/15/2011 Document Revised: 12/16/2012 Document Reviewed: 07/15/2013 [facility] Patient Information 2015 [place], [place]. This information is not intended to replace advice given to you by your health care provider. Make sure you discuss any questions you have with your health care provider.

## 2014-07-12 ENCOUNTER — Ambulatory Visit (HOSPITAL_COMMUNITY): Payer: Medicare Other | Attending: Cardiovascular Disease | Admitting: Radiology

## 2014-07-12 ENCOUNTER — Other Ambulatory Visit: Payer: Self-pay | Admitting: Internal Medicine

## 2014-07-12 DIAGNOSIS — R011 Cardiac murmur, unspecified: Secondary | ICD-10-CM | POA: Diagnosis not present

## 2014-07-12 NOTE — Progress Notes (Signed)
Echocardiogram performed.  

## 2014-07-14 ENCOUNTER — Other Ambulatory Visit: Payer: Self-pay | Admitting: Internal Medicine

## 2014-07-15 ENCOUNTER — Other Ambulatory Visit: Payer: Self-pay | Admitting: Family

## 2014-08-11 ENCOUNTER — Other Ambulatory Visit (INDEPENDENT_AMBULATORY_CARE_PROVIDER_SITE_OTHER): Payer: Medicare Other

## 2014-08-11 ENCOUNTER — Ambulatory Visit (INDEPENDENT_AMBULATORY_CARE_PROVIDER_SITE_OTHER): Payer: Medicare Other | Admitting: Family

## 2014-08-11 ENCOUNTER — Telehealth: Payer: Self-pay | Admitting: Family

## 2014-08-11 ENCOUNTER — Encounter: Payer: Self-pay | Admitting: Family

## 2014-08-11 VITALS — BP 118/72 | HR 58 | Temp 98.3°F | Resp 18 | Ht 63.0 in | Wt 154.8 lb

## 2014-08-11 DIAGNOSIS — M25532 Pain in left wrist: Secondary | ICD-10-CM | POA: Diagnosis not present

## 2014-08-11 DIAGNOSIS — M25531 Pain in right wrist: Secondary | ICD-10-CM | POA: Diagnosis not present

## 2014-08-11 DIAGNOSIS — E039 Hypothyroidism, unspecified: Secondary | ICD-10-CM

## 2014-08-11 LAB — TSH: TSH: 0.89 u[IU]/mL (ref 0.35–4.50)

## 2014-08-11 MED ORDER — NAPROXEN 500 MG PO TABS: 500.0000 mg | ORAL_TABLET | Freq: Two times a day (BID) | ORAL | Status: DC

## 2014-08-11 MED ORDER — LEVOTHYROXINE SODIUM 88 MCG PO TABS: 88.0000 ug | ORAL_TABLET | Freq: Every day | ORAL | Status: DC

## 2014-08-11 NOTE — Assessment & Plan Note (Signed)
Indicates increased fatigue since previous visit and starting lower dose of levothyroxine. Increase levothyroxine to 88 g. Obtain TSH to check's current status. Follow-up if symptoms worsen or fail to improve.

## 2014-08-11 NOTE — Telephone Encounter (Signed)
Please inform patient that her TSH today was 0.89 which is in the normal limits. We will need to retest her TSH in 6 weeks to determine if the increased medication effective and not too much.

## 2014-08-11 NOTE — Progress Notes (Signed)
Subjective:    Patient ID: Meagan Holmes, female    DOB: 1940/08/05, 74 y.o.   MRN: [id number]  Chief Complaint  Patient presents with  . Wrist Pain    having pain in both wrists says that she did have surgery on both wrist years ago for carpal tunnel and now it hurts to even try to open a bottle, x2 months, also states that her thryoid medication was cut back to 75 from 85 and says that she feels like she has alot less energy since medication has been cut back    HPI:  Meagan Holmes is a 74 y.o. female who presents today for follow up of wrist pain and decreased energy.  1) Hypothyroidism - Previously changed levothyroxine in February lowering the dose secondary to a low TSH. Indicates that ever since she has changed in February she has been having the associated feelings of fatigue and decreased energy. Has noticed minor weight gain but also notes that she has quit smoking recently.  Lab Results  Component Value Date   TSH 0.62 06/08/2014    2) Wrist pain - Previous history of bilateral carpal tunnel surgery release and notes the associated symptoms the pain located in her bilateral wrists described as a toothache which has been going on for about 2 months. Severity of the pain is rated about 8-9/10. Has tired the hydrocodone that was prescribed for her husband to help with the pain the allowed her to sleep. Denies any numbness and tingling.   No Known Allergies   Current Outpatient Prescriptions on File Prior to Visit  Medication Sig Dispense Refill  . aspirin 81 MG tablet Take 81 mg by mouth daily.    [name] atorvastatin (LIPITOR) 80 MG tablet Take 1 tablet (80 mg total) by mouth daily. 30 tablet 5  . calcium gluconate 500 MG tablet Take 500 mg by mouth daily.      . Cholecalciferol (VITAMIN D3 PO) Take by mouth.    [name] LORazepam (ATIVAN) 0.5 MG tablet Take 1 tablet (0.5 mg total) by mouth 2 (two) times daily as needed for anxiety. 20 tablet 0   No current facility-administered  medications on file prior to visit.    Past Surgical History  Procedure Laterality Date  . Appendectomy    . Thyroidectomy    . Carpal tunnel release    . Nasal septum surgery      Review of Systems  Constitutional: Positive for fatigue.  Musculoskeletal: Positive for arthralgias (Positive for bilateral wrist pain).      Objective:    BP 118/72 mmHg  Pulse 58  Temp(Src) 98.3 F (36.8 C) (Oral)  Resp 18  Ht 5\' 3"  (1.6 m)  Wt 154 lb 12.8 oz (70.217 kg)  BMI 27.43 kg/m2  SpO2 98% Nursing note and vital signs reviewed.  Physical Exam  Constitutional: She is oriented to person, place, and time. She appears well-developed and well-nourished. No distress.  Cardiovascular: Normal rate, regular rhythm, normal heart sounds and intact distal pulses.   Pulmonary/Chest: Effort normal and breath sounds normal.  Musculoskeletal:  Bilateral wrists: No obvious deformity, discoloration, or edema of bilateral wrists noted. Palpable tenderness in the carpal tunnel region in both areas. Wrist flexion and wrist extension both cause pain. Denies any numbness or tingling. Phallus test is positive only for pain with no numbness and tingling. Pulses are intact and appropriate. Sensation is intact and appropriate.  Neurological: She is alert and oriented to person, place, and time.  Skin: Skin is warm and dry.  Psychiatric: She has a normal mood and affect. Her behavior is normal. Judgment and thought content normal.       Assessment & Plan:

## 2014-08-11 NOTE — Progress Notes (Signed)
Pre visit review using our clinic review tool, if applicable. No additional management support is needed unless otherwise documented below in the visit note. 

## 2014-08-11 NOTE — Assessment & Plan Note (Addendum)
Symptoms and exam consistent with potential arthritis. Given previous history of carpal tunnel release and wrist problems, start conservatively with nonsteroidal anti-inflammatories and basic cock-up splint bracing. Start naproxen 500 mg. If symptoms are not relieved with anti-inflammatories, we'll consider prednisone and possible cortisone injections. Follow up in 1 week if no improvement is noted.

## 2014-08-11 NOTE — Telephone Encounter (Signed)
Pt aware of results 

## 2014-08-11 NOTE — Patient Instructions (Signed)
Thank you for choosing [facility].  Summary/Instructions:  Your prescription(s) have been submitted to your pharmacy or been printed and provided for you. Please take as directed and contact our office if you believe you are having problem(s) with the medication(s) or have any questions.  Please stop by the lab on the basement level of the building for your blood work. Your results will be released to [place] (or called to you) after review, usually within 72 hours after test completion. If any changes need to be made, you will be notified at that same time.  If your symptoms worsen or fail to improve, please contact our office for further instruction, or in case of emergency go directly to the emergency room at the closest medical facility.   Please take the Naproxen Sodium 2x daily for the next 5 days and may use as needed after that.  Please purchase a "cock-up" wrist splint for both arms to wear at night and as needed.  May consider ice as needed.  If your symptoms worsen, please let us know.

## 2014-09-08 ENCOUNTER — Other Ambulatory Visit: Payer: Self-pay | Admitting: Family

## 2014-09-09 NOTE — Telephone Encounter (Signed)
Faxed script back to [facility].../lmb 

## 2014-09-29 ENCOUNTER — Other Ambulatory Visit: Payer: Medicare Other

## 2014-10-06 ENCOUNTER — Ambulatory Visit: Payer: Medicare Other | Admitting: Cardiology

## 2014-10-12 ENCOUNTER — Ambulatory Visit: Payer: Medicare Other | Admitting: Internal Medicine

## 2014-10-14 ENCOUNTER — Telehealth: Payer: Self-pay

## 2014-10-14 NOTE — Telephone Encounter (Signed)
Spoke to pt and she requested that we call back to schedule that appt.   She stated that her husband passed away in 2022/10/04.

## 2014-10-25 DIAGNOSIS — H2512 Age-related nuclear cataract, left eye: Secondary | ICD-10-CM | POA: Diagnosis not present

## 2014-10-25 DIAGNOSIS — H18411 Arcus senilis, right eye: Secondary | ICD-10-CM | POA: Diagnosis not present

## 2014-10-25 DIAGNOSIS — H18412 Arcus senilis, left eye: Secondary | ICD-10-CM | POA: Diagnosis not present

## 2014-10-25 DIAGNOSIS — H2511 Age-related nuclear cataract, right eye: Secondary | ICD-10-CM | POA: Diagnosis not present

## 2014-11-01 DIAGNOSIS — Z1231 Encounter for screening mammogram for malignant neoplasm of breast: Secondary | ICD-10-CM | POA: Diagnosis not present

## 2014-11-01 LAB — HM MAMMOGRAPHY: HM Mammogram: NEGATIVE

## 2014-11-03 ENCOUNTER — Encounter: Payer: Self-pay | Admitting: Family

## 2014-11-08 ENCOUNTER — Encounter: Payer: Self-pay | Admitting: Cardiology

## 2014-11-08 ENCOUNTER — Other Ambulatory Visit (INDEPENDENT_AMBULATORY_CARE_PROVIDER_SITE_OTHER): Payer: Medicare Other | Admitting: *Deleted

## 2014-11-08 ENCOUNTER — Ambulatory Visit (INDEPENDENT_AMBULATORY_CARE_PROVIDER_SITE_OTHER): Payer: Medicare Other | Admitting: Cardiology

## 2014-11-08 VITALS — BP 130/58 | HR 60 | Ht 63.0 in | Wt 158.0 lb

## 2014-11-08 DIAGNOSIS — I779 Disorder of arteries and arterioles, unspecified: Secondary | ICD-10-CM

## 2014-11-08 DIAGNOSIS — R011 Cardiac murmur, unspecified: Secondary | ICD-10-CM | POA: Diagnosis not present

## 2014-11-08 DIAGNOSIS — E785 Hyperlipidemia, unspecified: Secondary | ICD-10-CM

## 2014-11-08 DIAGNOSIS — I739 Peripheral vascular disease, unspecified: Secondary | ICD-10-CM

## 2014-11-08 LAB — LIPID PANEL
Cholesterol: 118 mg/dL (ref 0–200)
HDL: 42.4 mg/dL (ref >39.00)
LDL Cholesterol: 62 mg/dL (ref 0–99)
NonHDL: 75.6
TRIGLYCERIDES: 68 mg/dL (ref 0.0–149.0)
Total CHOL/HDL Ratio: 3
VLDL: 13.6 mg/dL (ref 0.0–40.0)

## 2014-11-08 LAB — BASIC METABOLIC PANEL
BUN: 16 mg/dL (ref 6–23)
CHLORIDE: 105 meq/L (ref 96–112)
CO2: 30 meq/L (ref 19–32)
CREATININE: 0.73 mg/dL (ref 0.40–1.20)
Calcium: 9.2 mg/dL (ref 8.4–10.5)
GFR: 82.74 mL/min (ref >60.00)
GLUCOSE: 89 mg/dL (ref 70–99)
POTASSIUM: 3.8 meq/L (ref 3.5–5.1)
SODIUM: 140 meq/L (ref 135–145)

## 2014-11-08 LAB — HEPATIC FUNCTION PANEL
ALT: 74 U/L — AB (ref 0–35)
AST: 47 U/L — AB (ref 0–37)
Albumin: 3.8 g/dL (ref 3.5–5.2)
Alkaline Phosphatase: 53 U/L (ref 39–117)
BILIRUBIN DIRECT: 0.1 mg/dL (ref 0.0–0.3)
Total Bilirubin: 0.6 mg/dL (ref 0.2–1.2)
Total Protein: 6.9 g/dL (ref 6.0–8.3)

## 2014-11-08 NOTE — Progress Notes (Signed)
Cardiology Office Note   Date:  11/08/2014   ID:  Meagan Holmes, DOB [birth year], MRN [id number]  PCP:  [name], FNP  Cardiologist: [name] MD      History of Present Illness: Meagan Holmes is a 74 y.o. female who presents for 3 month follow-up office visit  Meagan Holmes is a 74 y.o. female who is a medical patient of Dr. [name].We had seen her for the first time about 3 months ago.  He was seen at that time because of an abnormal carotid duplex ultrasound.  The patient is a former smoker.  Recently she has been smoking vapor cigarettes. He has had known carotid bruits and has had recent carotid duplex ultrasound. Her most recent carotid duplex on 06/08/14 showed an increase in the degree of stenosis in the left carotid artery. Whereas previously it had been 40-59%, this time it was 60-79%. The right internal carotid artery was stable with a mild stenosis between 1 and 39%. The patient has not had any symptoms of TIAs such as scintillating scotomata or transient speech disturbance or motor weakness. She does not have any history of ischemic heart disease. She has not been expressing any chest pain or left arm discomfort. She does have some exertional dyspnea. This may have been from her previous cigarette habit. The patient has tried to stay more healthy recently. She drops her great-granddaughter off at the childcare facility and then goes to [employer] 5 days a week at 7:30 AM to exercise. The patient is retired. She retired in 2004. She previously worked in [employer], educational. Patient is not diabetic. She does have a history of hypercholesterolemia and her most recent LDL was 119 in December 2015. She has not been on a low cholesterol diet. She eats a lot of cheese and meat products. She had been taking Lipitor 40 mg daily.  At her last visit we increased that to 80 mg daily.  She has not been experiencing any myalgias or side effects. Her family history  reveals that her father died at age 43 of a heart attack. Mother died at 18 of complications from diabetes.  Past Medical History  Diagnosis Date  . Allergic rhinitis     a lot better after septoplasty- remote  . Hypothyroidism   . Osteopenia   . Hematuria 2004    (-) CT abd-pelvis (-) cystoscopy  . COPD (chronic obstructive pulmonary disease) 10/02/2010  . Anxiety and depression 02/10/2007    Past Surgical History  Procedure Laterality Date  . Appendectomy    . Thyroidectomy    . Carpal tunnel release    . Nasal septum surgery       Current Outpatient Prescriptions  Medication Sig Dispense Refill  . aspirin 81 MG tablet Take 81 mg by mouth daily.    [name] atorvastatin (LIPITOR) 80 MG tablet Take 1 tablet (80 mg total) by mouth daily. 30 tablet 5  . calcium gluconate 500 MG tablet Take 500 mg by mouth daily.      . Cholecalciferol (VITAMIN D3 PO) Take 2,000 Units by mouth daily.     [name] levothyroxine (SYNTHROID, LEVOTHROID) 88 MCG tablet Take 1 tablet (88 mcg total) by mouth daily before breakfast. 30 tablet 2  . LORazepam (ATIVAN) 0.5 MG tablet TAKE 1 TABLET BY MOUTH TWICE A DAY AS NEEDED FOR ANXIETY 20 tablet 0  . naproxen (NAPROSYN) 500 MG tablet Take 1 tablet (500 mg total) by mouth 2 (two) times daily with  a meal. 30 tablet 0   No current facility-administered medications for this visit.    Allergies:   Review of patient's allergies indicates no known allergies.    Social History:  The patient  reports that she has been smoking Cigarettes.  She started smoking about 2 years ago. She has a 25 pack-year smoking history. She has never used smokeless tobacco. She reports that she does not drink alcohol or use illicit drugs.   Family History:  The patient's family history includes Colon cancer in her other; Diabetes in her mother; Heart attack in her father; Hypertension in her mother. There is no history of Breast cancer, Cancer, Heart disease, or Stroke.    ROS:  Please see the  history of present illness.   Otherwise, review of systems are positive for none.   All other systems are reviewed and negative.    PHYSICAL EXAM: VS:  BP 130/58 mmHg  Pulse 60  Ht 5\' 3"  (1.6 m)  Wt 158 lb (71.668 kg)  BMI 28.00 kg/m2  SpO2 90% , BMI Body mass index is 28 kg/(m^2). GEN: Well nourished, well developed, in no acute distress HEENT: normal. Neck: no JVD, soft left carotid bruit. Cardiac: RRR; soft systolic ejection murmur at the base. No rubs, or gallops,no edema  Respiratory:  clear to auscultation bilaterally, normal work of breathing GI: soft, nontender, nondistended, + BS MS: no deformity or atrophy Skin: warm and dry, no rash Neuro:  Strength and sensation are intact Psych: euthymic mood, full affect   EKG:  EKG is not ordered today.    Recent Labs: 04/12/2014: ALT 18; Hemoglobin 13.9; Platelets 270.0 08/11/2014: TSH 0.89    Lipid Panel    Component Value Date/Time   CHOL 178 04/12/2014 1128   TRIG 99.0 04/12/2014 1128   TRIG 83 02/25/2006 1312   HDL 39.20 04/12/2014 1128   CHOLHDL 5 04/12/2014 1128   CHOLHDL 5.5 CALC 02/25/2006 1312   VLDL 19.8 04/12/2014 1128   LDLCALC 119* 04/12/2014 1128   LDLDIRECT 171.2 03/19/2012 1129   LDLDIRECT 156.8 02/25/2006 1312      Wt Readings from Last 3 Encounters:  11/08/14 158 lb (71.668 kg)  08/11/14 154 lb 12.8 oz (70.217 kg)  07/06/14 153 lb 3.2 oz (69.491 kg)        ASSESSMENT AND PLAN: 1. Bilateral carotid artery stenosis. Left internal carotid artery is more severe than the right. Neither requires surgery at this time. We need to intensify her medical therapy to prevent plaque progression.  She is not having any TIA or stroke symptoms 2. Systolic heart murmur.  Normal echocardiogram on 07/12/14.The echocardiogram was normal. There was trivial mitral regurgitation but no significant problem with any of the valves. The left ventricular function was normal. 3. Hypercholesterolemia 4. Former  tobacco use.  Unfortunately still smokes occasionally.  Also smokes low nicotine vapor cigarettes. .   Current medicines are reviewed at length with the patient today. The patient does not have concerns regarding medicines.  .   Current medicines are reviewed at length with the patient today.  The patient does not have concerns regarding medicines.  The following changes have been made:  no change  Labs/ tests ordered today include:   Orders Placed This Encounter  Procedures  . Lipid panel  . Hepatic function panel  . Basic metabolic panel    Disposition: Work harder on diet.  Work harder on quitting smoking altogether.  Continue Lipitor 80 mg daily.  Blood work today  pending.  Recheck in 6 months for follow-up office visit lipid panel hepatic function panel and basal metabolic panel  Signed, [name] MD 11/08/2014 1:24 PM    [facility] [address], [place], [place]  [address] Phone: [phone]; Fax: [phone]

## 2014-11-08 NOTE — Patient Instructions (Signed)
Medication Instructions:  Your physician recommends that you continue on your current medications as directed. Please refer to the Current Medication list given to you today.  Labwork: NONE  Testing/Procedures: NONE  Follow-Up: Your physician wants you to follow-up in: 6 months with fasting labs (lp/bmet/hfp)  You will receive a reminder letter in the mail two months in advance. If you don't receive a letter, please call our office to schedule the follow-up appointment.     

## 2014-11-14 ENCOUNTER — Telehealth: Payer: Self-pay | Admitting: Cardiology

## 2014-11-14 MED ORDER — ATORVASTATIN CALCIUM 80 MG PO TABS: ORAL_TABLET | ORAL | Status: DC

## 2014-11-14 NOTE — Telephone Encounter (Signed)
New message     Pt returning call regarding lab results.   Please call to discuss.

## 2014-11-14 NOTE — Telephone Encounter (Signed)
Spoke with pt and informed her of lab results and new orders for Lipitor 80mg  Mon, Wed and Fri and 40mg  the other days. Pt verbalized understanding and was in agreement with this plan.

## 2014-12-06 ENCOUNTER — Other Ambulatory Visit: Payer: Self-pay | Admitting: Family

## 2014-12-10 ENCOUNTER — Other Ambulatory Visit: Payer: Self-pay | Admitting: Family

## 2015-01-09 ENCOUNTER — Encounter: Payer: Self-pay | Admitting: Internal Medicine

## 2015-01-09 ENCOUNTER — Telehealth: Payer: Self-pay | Admitting: Family

## 2015-01-09 ENCOUNTER — Ambulatory Visit (INDEPENDENT_AMBULATORY_CARE_PROVIDER_SITE_OTHER): Payer: Medicare Other | Admitting: Internal Medicine

## 2015-01-09 VITALS — BP 140/80 | HR 76 | Temp 98.6°F | Resp 20 | Ht 63.0 in | Wt 159.0 lb

## 2015-01-09 DIAGNOSIS — F17200 Nicotine dependence, unspecified, uncomplicated: Secondary | ICD-10-CM

## 2015-01-09 DIAGNOSIS — K219 Gastro-esophageal reflux disease without esophagitis: Secondary | ICD-10-CM

## 2015-01-09 DIAGNOSIS — J410 Simple chronic bronchitis: Secondary | ICD-10-CM

## 2015-01-09 DIAGNOSIS — R21 Rash and other nonspecific skin eruption: Secondary | ICD-10-CM

## 2015-01-09 DIAGNOSIS — Z720 Tobacco use: Secondary | ICD-10-CM | POA: Diagnosis not present

## 2015-01-09 MED ORDER — PANTOPRAZOLE SODIUM 40 MG PO TBEC: 40.0000 mg | DELAYED_RELEASE_TABLET | Freq: Every day | ORAL | Status: DC

## 2015-01-09 MED ORDER — METHYLPREDNISOLONE ACETATE 80 MG/ML IJ SUSP
80.0000 mg | Freq: Once | INTRAMUSCULAR | Status: AC
  Administered 2015-01-09: 80 mg via INTRAMUSCULAR

## 2015-01-09 NOTE — Telephone Encounter (Signed)
Please advise and order if Dr. Burnice Logan agrees

## 2015-01-09 NOTE — Telephone Encounter (Signed)
Pt would also like to know if she could get a zpak for her upper resp/cough. Pt has had chest issue since Friday. Would like xray if you will agree

## 2015-01-09 NOTE — Progress Notes (Signed)
Subjective:    Patient ID: Meagan Holmes, female    DOB: Jun 03, 1940, 74 y.o.   MRN: [id number]  HPI  74 year old patient who has a history of COPD and tobacco use.  She presents with a three-day history of increasing cough, congestion.  Denies any wheezing.  She has been taken a number of OTC products with little benefit.  She states that she also has a daughter who was treated for cold symptoms with amoxicillin.  She states that over the weekend.  She took 3 dosages.  She has developed a generalized very pruritic rash.  This probably is her chief complaint.  There is been no fever, chills, shortness of breath or wheezing  She also describes a several week history of worsening reflux symptoms.  Past Medical History  Diagnosis Date  . Allergic rhinitis     a lot better after septoplasty- remote  . Hypothyroidism   . Osteopenia   . Hematuria 2004    (-) CT abd-pelvis (-) cystoscopy  . COPD (chronic obstructive pulmonary disease) 10/02/2010  . Anxiety and depression 02/10/2007    Social History   Social History  . Marital Status: Married    Spouse Name: N/A  . Number of Children: 3  . Years of Education: 12   Occupational History  . retired    Social History Main Topics  . Smoking status: Current Every Day Smoker -- 0.50 packs/day for 50 years    Types: Cigarettes    Start date: 04/29/2012  . Smokeless tobacco: Never Used     Comment: 1/2 ppd  . Alcohol Use: No  . Drug Use: No  . Sexual Activity: Not on file   Other Topics Concern  . Not on file   Social History Narrative   Lost son (to drugs) 2008     Husband ill, she takes care of him, raising her g-daughter    Born and raised in [place], [place]. Currently resides in a house with her husband and greatgrandaughter. Maltese dog. Fun: play games with family, gardening, gym   Denies religious beliefs that would effect health care.                 Past Surgical History  Procedure Laterality Date  . Appendectomy      . Thyroidectomy    . Carpal tunnel release    . Nasal septum surgery      Family History  Problem Relation Age of Onset  . Breast cancer Neg Hx   . Colon cancer Other     uncle, age?  . Cancer Neg Hx   . Heart disease Neg Hx   . Diabetes Mother   . Hypertension Mother   . Stroke Neg Hx   . Heart attack Father     No Known Allergies  Current Outpatient Prescriptions on File Prior to Visit  Medication Sig Dispense Refill  . aspirin 81 MG tablet Take 81 mg by mouth daily.    [name] atorvastatin (LIPITOR) 80 MG tablet Take 80mg  on Monday, Wednesday and Friday.  Take 40mg  all other days. 30 tablet 5  . calcium gluconate 500 MG tablet Take 500 mg by mouth daily.      . Cholecalciferol (VITAMIN D3 PO) Take 2,000 Units by mouth daily.     [name] levothyroxine (SYNTHROID, LEVOTHROID) 88 MCG tablet TAKE 1 TABLET (88 MCG TOTAL) BY MOUTH DAILY BEFORE BREAKFAST. 30 tablet 2  . LORazepam (ATIVAN) 0.5 MG tablet TAKE 1 TABLET BY MOUTH TWICE  A DAY AS NEEDED FOR ANXIETY 20 tablet 0  . naproxen (NAPROSYN) 500 MG tablet Take 1 tablet (500 mg total) by mouth 2 (two) times daily with a meal. 30 tablet 0   No current facility-administered medications on file prior to visit.    BP 140/80 mmHg  Pulse 76  Temp(Src) 98.6 F (37 C) (Oral)  Resp 20  Ht 5\' 3"  (1.6 m)  Wt 159 lb (72.122 kg)  BMI 28.17 kg/m2     Review of Systems  Constitutional: Positive for activity change and appetite change.  HENT: Negative for congestion, dental problem, hearing loss, rhinorrhea, sinus pressure, sore throat and tinnitus.   Eyes: Negative for pain, discharge and visual disturbance.  Respiratory: Negative for cough and shortness of breath.   Cardiovascular: Negative for chest pain, palpitations and leg swelling.  Gastrointestinal: Negative for nausea, vomiting, abdominal pain, diarrhea, constipation, blood in stool and abdominal distention.  Genitourinary: Negative for dysuria, urgency, frequency, hematuria, flank  pain, vaginal bleeding, vaginal discharge, difficulty urinating, vaginal pain and pelvic pain.  Musculoskeletal: Negative for joint swelling, arthralgias and gait problem.  Skin: Negative for rash.  Neurological: Negative for dizziness, syncope, speech difficulty, weakness, numbness and headaches.  Hematological: Negative for adenopathy.  Psychiatric/Behavioral: Negative for behavioral problems, dysphoric mood and agitation. The patient is not nervous/anxious.        Objective:   Physical Exam  Constitutional: She is oriented to person, place, and time. She appears well-developed and well-nourished.  HENT:  Head: Normocephalic.  Right Ear: External ear normal.  Left Ear: External ear normal.  Mouth/Throat: Oropharynx is clear and moist.  Eyes: Conjunctivae and EOM are normal. Pupils are equal, round, and reactive to light.  Neck: Normal range of motion. Neck supple. No thyromegaly present.  Cardiovascular: Normal rate, regular rhythm, normal heart sounds and intact distal pulses.   Pulmonary/Chest: Effort normal and breath sounds normal.  Abdominal: Soft. Bowel sounds are normal. She exhibits no mass. There is no tenderness.  Musculoskeletal: Normal range of motion.  Lymphadenopathy:    She has no cervical adenopathy.  Neurological: She is alert and oriented to person, place, and time.  Skin: Skin is warm and dry. No rash noted.  Diffuse maculopapular rash most marked over the extremities  Psychiatric: She has a normal mood and affect. Her behavior is normal.          Assessment & Plan:   Viral URI COPD Maculopapular rash.  Probably adverse drug reaction secondary to amoxicillin.  Patient made aware Symptomatic GERD.  We'll place on an aggressive anti-GERD regimen as well as PPI therapy.  Follow-up PCP 1 month  Will treat with Depo-Medrol Mucinex DM  Will report any clinical worsening or new symptoms

## 2015-01-09 NOTE — Telephone Encounter (Signed)
Based on Dr. Truddie Hidden assessment there does not appear to be a need for a Zpack or a chest x-ray at this time as it appears to be a viral infection. Most viral infections are worst in the first 3-5 days which she is currently in. If after the next couple of days there is no improvement or worsening please have her follow up.

## 2015-01-09 NOTE — Telephone Encounter (Signed)
Please see message and advise 

## 2015-01-09 NOTE — Progress Notes (Signed)
Pre visit review using our clinic review tool, if applicable. No additional management support is needed unless otherwise documented below in the visit note. 

## 2015-01-09 NOTE — Patient Instructions (Signed)
Acute bronchitis symptoms for less than 10 days are generally not helped by antibiotics.  Take over-the-counter expectorants and cough medications such as  Mucinex DM.  Call if there is no improvement in 5 to 7 days or if  you develop worsening cough, fever, or new symptoms, such as shortness of breath or chest pain.  Avoids foods high in acid such as tomatoes citrus juices, and spicy foods.  Avoid eating within two hours of lying down or before exercising.  Do not overheat.  Try smaller more frequent meals.  If symptoms persist, elevate the head of her bed 12 inches while sleeping.  Food Choices for Gastroesophageal Reflux Disease When you have gastroesophageal reflux disease (GERD), the foods you eat and your eating habits are very important. Choosing the right foods can help ease the discomfort of GERD. WHAT GENERAL GUIDELINES DO I NEED TO FOLLOW?  Choose fruits, vegetables, whole grains, low-fat dairy products, and low-fat meat, fish, and poultry.  Limit fats such as oils, salad dressings, butter, nuts, and avocado.  Keep a food diary to identify foods that cause symptoms.  Avoid foods that cause reflux. These may be different for different people.  Eat frequent small meals instead of three large meals each day.  Eat your meals slowly, in a relaxed setting.  Limit fried foods.  Cook foods using methods other than frying.  Avoid drinking alcohol.  Avoid drinking large amounts of liquids with your meals.  Avoid bending over or lying down until 2-3 hours after eating. WHAT FOODS ARE NOT RECOMMENDED? The following are some foods and drinks that may worsen your symptoms: Vegetables Tomatoes. Tomato juice. Tomato and spaghetti sauce. Chili peppers. Onion and garlic. Horseradish. Fruits Oranges, grapefruit, and lemon (fruit and juice). Meats High-fat meats, fish, and poultry. This includes hot dogs, ribs, ham, sausage, salami, and bacon. Dairy Whole milk and chocolate milk. Sour  cream. Cream. Butter. Ice cream. Cream cheese.  Beverages Coffee and tea, with or without caffeine. Carbonated beverages or energy drinks. Condiments Hot sauce. Barbecue sauce.  Sweets/Desserts Chocolate and cocoa. Donuts. Peppermint and spearmint. Fats and Oils High-fat foods, including [place] fries and potato chips. Other Vinegar. Strong spices, such as black pepper, white pepper, red pepper, cayenne, curry powder, cloves, ginger, and chili powder. The items listed above may not be a complete list of foods and beverages to avoid. Contact your dietitian for more information. Document Released: 04/15/2005 Document Revised: 04/20/2013 Document Reviewed: 02/17/2013 [facility] Patient Information 2015 [place], [place]. This information is not intended to replace advice given to you by your health care provider. Make sure you discuss any questions you have with your health care provider.

## 2015-01-09 NOTE — Telephone Encounter (Signed)
Patient called stating she was seen acutely at [facility] today and would like to get a chest xray bc her daughter in law has pneumonia. Please advise. CB# [phone]

## 2015-01-10 ENCOUNTER — Telehealth: Payer: Self-pay | Admitting: Family

## 2015-01-10 DIAGNOSIS — L271 Localized skin eruption due to drugs and medicaments taken internally: Secondary | ICD-10-CM | POA: Diagnosis not present

## 2015-01-10 DIAGNOSIS — J158 Pneumonia due to other specified bacteria: Secondary | ICD-10-CM | POA: Diagnosis not present

## 2015-01-10 NOTE — Telephone Encounter (Signed)
FYI

## 2015-01-10 NOTE — Telephone Encounter (Signed)
Patient called back to inform MD that she seen another to MD day and states that she has pneumonia in her left lung. Pt states that she was given a breathing treatment, a shot and was put on prednisone.

## 2015-01-10 NOTE — Telephone Encounter (Signed)
Spoke to pt, told her per Terri Piedra NP that you see normally said Based on Dr. Truddie Hidden assessment there does not appear to be a need for a Zpack or a chest x-ray at this time as it appears to be a viral infection. Most viral infections are worst in the first 3-5 days which you are currently in. If after the next couple of days there is no improvement or worsening please follow up with your provider Terri Piedra NP. Pt verbalized understanding.

## 2015-02-21 ENCOUNTER — Ambulatory Visit: Payer: Medicare Other | Admitting: Family

## 2015-02-23 ENCOUNTER — Ambulatory Visit (INDEPENDENT_AMBULATORY_CARE_PROVIDER_SITE_OTHER): Payer: Medicare Other | Admitting: Family

## 2015-02-23 ENCOUNTER — Encounter: Payer: Self-pay | Admitting: Family

## 2015-02-23 VITALS — BP 120/70 | HR 71 | Temp 97.8°F | Ht 63.0 in | Wt 161.5 lb

## 2015-02-23 DIAGNOSIS — F418 Other specified anxiety disorders: Secondary | ICD-10-CM

## 2015-02-23 DIAGNOSIS — R350 Frequency of micturition: Secondary | ICD-10-CM | POA: Diagnosis not present

## 2015-02-23 DIAGNOSIS — F329 Major depressive disorder, single episode, unspecified: Secondary | ICD-10-CM

## 2015-02-23 DIAGNOSIS — F419 Anxiety disorder, unspecified: Secondary | ICD-10-CM

## 2015-02-23 MED ORDER — BUPROPION HCL ER (SR) 150 MG PO TB12: ORAL_TABLET | ORAL | Status: DC

## 2015-02-23 MED ORDER — CIPROFLOXACIN HCL 250 MG PO TABS: 250.0000 mg | ORAL_TABLET | Freq: Two times a day (BID) | ORAL | Status: DC

## 2015-02-23 NOTE — Progress Notes (Signed)
Subjective:    Patient ID: Meagan Holmes, female    DOB: 1940/12/30, 73 y.o.   MRN: [id number]  Chief Complaint  Patient presents with  . Depression  . Urinary Frequency    HPI:  Meagan Holmes is a 74 y.o. female who  has a past medical history of Allergic rhinitis; Hypothyroidism; Osteopenia; Hematuria (2004); COPD (chronic obstructive pulmonary disease) ([place]) (10/02/2010); and Anxiety and depression (02/10/2007). and presents today   1.) Depression - This is a new problem. Associated symptom of the recent passing of her husband in May of this year has her thinking about what is happening next. Denies suicidal ideations. 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 She does have a lot of energy and she copes with her feelings by staying busy. Describes that she does not have a lot of fun and would like to have fun. She has a good support system around her.   2.) Urinary frequency - This is a new problem.  Associated  Symptoms of urinary frequency and malodor abdomen going on for approximately one week. Denies flank pain, fevers, chills, or lower abdominal pain. Denies any modifying factors or attempted treatments to make this better.   No Known Allergies   Current Outpatient Prescriptions on File Prior to Visit  Medication Sig Dispense Refill  . aspirin 81 MG tablet Take 81 mg by mouth daily.    [name] atorvastatin (LIPITOR) 80 MG tablet Take 80mg  on Monday, Wednesday and Friday.  Take 40mg  all other days. 30 tablet 5  . calcium gluconate 500 MG tablet Take 500 mg by mouth daily.      . Cholecalciferol (VITAMIN D3 PO) Take 2,000 Units by mouth daily.     [name] levothyroxine (SYNTHROID, LEVOTHROID) 88 MCG tablet TAKE 1 TABLET (88 MCG TOTAL) BY MOUTH DAILY BEFORE BREAKFAST. 30 tablet 2  . LORazepam (ATIVAN) 0.5 MG tablet TAKE 1 TABLET BY MOUTH TWICE A DAY AS NEEDED FOR ANXIETY 20 tablet 0  . naproxen (NAPROSYN) 500 MG tablet Take 1 tablet (500 mg total) by mouth 2 (two) times daily  with a meal. 30 tablet 0  . pantoprazole (PROTONIX) 40 MG tablet Take 1 tablet (40 mg total) by mouth daily. 30 tablet 3   No current facility-administered medications on file prior to visit.    Past Medical History  Diagnosis Date  . Allergic rhinitis     a lot better after septoplasty- remote  . Hypothyroidism   . Osteopenia   . Hematuria 2004    (-) CT abd-pelvis (-) cystoscopy  . COPD (chronic obstructive pulmonary disease) ([place]) 10/02/2010  . Anxiety and depression 02/10/2007    Past Surgical History  Procedure Laterality Date  . Appendectomy    . Thyroidectomy    . Carpal tunnel release    . Nasal septum surgery      Review of Systems  Constitutional: Negative for fever and chills.  Genitourinary: Positive for urgency and frequency. Negative for dysuria, hematuria and flank pain.  Psychiatric/Behavioral: Positive for dysphoric mood. Negative for suicidal ideas.      Objective:    BP 120/70 mmHg  Pulse 71  Temp(Src) 97.8 F (36.6 C) (Oral)  Ht 5\' 3"  (1.6 m)  Wt 161 lb 8 oz (73.256 kg)  BMI 28.62 kg/m2  SpO2 94% Nursing note and vital signs reviewed.  Physical Exam  Constitutional: She is oriented to person, place, and time. She appears well-developed and well-nourished. No distress.  Cardiovascular: Normal rate, regular  rhythm, normal heart sounds and intact distal pulses.   Pulmonary/Chest: Effort normal and breath sounds normal.  Abdominal: There is no CVA tenderness.  Neurological: She is alert and oriented to person, place, and time.  Skin: Skin is warm and dry.  Psychiatric: Her behavior is normal. Judgment and thought content normal. She exhibits a depressed mood.       Assessment & Plan:   Problem List Items Addressed This Visit      Other   Anxiety and depression    Symptoms and PHQ9 results consistent with moderate depression. Discussed recommended treatment with counseling and medication, however declined counseling at this time. Start  wellbutrin. Denies suicidal ideations. Instructed to seek further care if thoughts of suicide develop. Follow up in 1 month or sooner if needed.       Relevant Medications   buPROPion (WELLBUTRIN SR) 150 MG 12 hr tablet   Urinary frequency - Primary     Unable to provide a urine sample. We'll treat prophylactically with ciprofloxacin. Follow-up  If symptoms worsen or fail to improve.      Relevant Medications   ciprofloxacin (CIPRO) 250 MG tablet

## 2015-02-23 NOTE — Assessment & Plan Note (Signed)
Symptoms and PHQ9 results consistent with moderate depression. Discussed recommended treatment with counseling and medication, however declined counseling at this time. Start wellbutrin. Denies suicidal ideations. Instructed to seek further care if thoughts of suicide develop. Follow up in 1 month or sooner if needed.

## 2015-02-23 NOTE — Assessment & Plan Note (Signed)
Unable to provide a urine sample. We'll treat prophylactically with ciprofloxacin. Follow-up  If symptoms worsen or fail to improve.

## 2015-02-23 NOTE — Patient Instructions (Addendum)
Thank you for choosing [facility].  Summary/Instructions:  Your prescription(s) have been submitted to your pharmacy or been printed and provided for you. Please take as directed and contact our office if you believe you are having problem(s) with the medication(s) or have any questions.  If your symptoms worsen or fail to improve, please contact our office for further instruction, or in case of emergency go directly to the emergency room at the closest medical facility.    Major Depressive Disorder Major depressive disorder is a mental illness. It also may be called clinical depression or unipolar depression. Major depressive disorder usually causes feelings of sadness, hopelessness, or helplessness. Some people with this disorder do not feel particularly sad but lose interest in doing things they used to enjoy (anhedonia). Major depressive disorder also can cause physical symptoms. It can interfere with work, school, relationships, and other normal everyday activities. The disorder varies in severity but is longer lasting and more serious than the sadness we all feel from time to time in our lives. Major depressive disorder often is triggered by stressful life events or major life changes. Examples of these triggers include divorce, loss of your job or home, a move, and the death of a family member or close friend. Sometimes this disorder occurs for no obvious reason at all. People who have family members with major depressive disorder or bipolar disorder are at higher risk for developing this disorder, with or without life stressors. Major depressive disorder can occur at any age. It may occur just once in your life (single episode major depressive disorder). It may occur multiple times (recurrent major depressive disorder). SYMPTOMS People with major depressive disorder have either anhedonia or depressed mood on nearly a daily basis for at least 2 weeks or longer. Symptoms of depressed mood  include:  Feelings of sadness (blue or down in the dumps) or emptiness.  Feelings of hopelessness or helplessness.  Tearfulness or episodes of crying (may be observed by others).  Irritability (children and adolescents). In addition to depressed mood or anhedonia or both, people with this disorder have at least four of the following symptoms:  Difficulty sleeping or sleeping too much.   Significant change (increase or decrease) in appetite or weight.   Lack of energy or motivation.  Feelings of guilt and worthlessness.   Difficulty concentrating, remembering, or making decisions.  Unusually slow movement (psychomotor retardation) or restlessness (as observed by others).   Recurrent wishes for death, recurrent thoughts of self-harm (suicide), or a suicide attempt. People with major depressive disorder commonly have persistent negative thoughts about themselves, other people, and the world. People with severe major depressive disorder may experiencedistorted beliefs or perceptions about the world (psychotic delusions). They also may see or hear things that are not real (psychotic hallucinations). DIAGNOSIS Major depressive disorder is diagnosed through an assessment by your health care provider. Your health care provider will ask aboutaspects of your daily life, such as mood,sleep, and appetite, to see if you have the diagnostic symptoms of major depressive disorder. Your health care provider may ask about your medical history and use of alcohol or drugs, including prescription medicines. Your health care provider also may do a physical exam and blood work. This is because certain medical conditions and the use of certain substances can cause major depressive disorder-like symptoms (secondary depression). Your health care provider also may refer you to a mental health specialist for further evaluation and treatment. TREATMENT It is important to recognize the symptoms of major  depressive disorder and seek treatment. The following treatments can be prescribed for this disorder:   Medicine. Antidepressant medicines usually are prescribed. Antidepressant medicines are thought to correct chemical imbalances in the brain that are commonly associated with major depressive disorder. Other types of medicine may be added if the symptoms do not respond to antidepressant medicines alone or if psychotic delusions or hallucinations occur.  Talk therapy. Talk therapy can be helpful in treating major depressive disorder by providing support, education, and guidance. Certain types of talk therapy also can help with negative thinking (cognitive behavioral therapy) and with relationship issues that trigger this disorder (interpersonal therapy). A mental health specialist can help determine which treatment is best for you. Most people with major depressive disorder do well with a combination of medicine and talk therapy. Treatments involving electrical stimulation of the brain can be used in situations with extremely severe symptoms or when medicine and talk therapy do not work over time. These treatments include electroconvulsive therapy, transcranial magnetic stimulation, and vagal nerve stimulation.   This information is not intended to replace advice given to you by your health care provider. Make sure you discuss any questions you have with your health care provider.   Document Released: 08/10/2012 Document Revised: 05/06/2014 Document Reviewed: 08/10/2012 Elsevier Interactive Patient Education [employer].

## 2015-03-04 ENCOUNTER — Other Ambulatory Visit: Payer: Self-pay | Admitting: Family

## 2015-03-16 ENCOUNTER — Telehealth: Payer: Self-pay | Admitting: Family

## 2015-03-16 DIAGNOSIS — N8110 Cystocele, unspecified: Secondary | ICD-10-CM

## 2015-03-16 NOTE — Telephone Encounter (Signed)
Patient would like to be referred to get her "bladder tacked up".  She would like to know if Meagan Holmes could refer her or if she would need to make an appointment with him first?

## 2015-03-16 NOTE — Telephone Encounter (Signed)
Please advise 

## 2015-03-20 NOTE — Telephone Encounter (Signed)
LVM letting pt know.  

## 2015-03-20 NOTE — Telephone Encounter (Signed)
Referral to urology sent

## 2015-03-20 NOTE — Addendum Note (Signed)
Addended by: [name] D on: 03/20/2015 12:57 PM   Modules accepted: Orders

## 2015-03-26 ENCOUNTER — Other Ambulatory Visit: Payer: Self-pay | Admitting: Family

## 2015-03-27 ENCOUNTER — Encounter: Payer: Self-pay | Admitting: Family

## 2015-03-27 NOTE — Telephone Encounter (Signed)
Received call pt states she has 2 Wellbutrin left med has not made any difference. Wanting to know does md want to increase dosage or rx something else...Johny Chess

## 2015-03-30 ENCOUNTER — Other Ambulatory Visit: Payer: Self-pay

## 2015-03-30 MED ORDER — ATORVASTATIN CALCIUM 80 MG PO TABS: ORAL_TABLET | ORAL | Status: DC

## 2015-04-04 ENCOUNTER — Other Ambulatory Visit: Payer: Self-pay

## 2015-04-04 MED ORDER — BUPROPION HCL ER (SR) 150 MG PO TB12: ORAL_TABLET | ORAL | Status: DC

## 2015-04-06 ENCOUNTER — Encounter: Payer: Self-pay | Admitting: Family

## 2015-04-06 ENCOUNTER — Ambulatory Visit (INDEPENDENT_AMBULATORY_CARE_PROVIDER_SITE_OTHER)
Admission: RE | Admit: 2015-04-06 | Discharge: 2015-04-06 | Disposition: A | Discharge status: Home / Self Care | Payer: Medicare Other | Source: Ambulatory Visit | Attending: Family | Admitting: Family

## 2015-04-06 ENCOUNTER — Ambulatory Visit (INDEPENDENT_AMBULATORY_CARE_PROVIDER_SITE_OTHER): Payer: Medicare Other | Admitting: Family

## 2015-04-06 VITALS — BP 124/80 | HR 60 | Temp 97.9°F | Resp 18 | Ht 63.0 in | Wt 160.8 lb

## 2015-04-06 DIAGNOSIS — M25511 Pain in right shoulder: Secondary | ICD-10-CM

## 2015-04-06 DIAGNOSIS — F329 Major depressive disorder, single episode, unspecified: Secondary | ICD-10-CM

## 2015-04-06 DIAGNOSIS — F32A Depression, unspecified: Secondary | ICD-10-CM

## 2015-04-06 DIAGNOSIS — F418 Other specified anxiety disorders: Secondary | ICD-10-CM

## 2015-04-06 DIAGNOSIS — F419 Anxiety disorder, unspecified: Secondary | ICD-10-CM

## 2015-04-06 MED ORDER — NAPROXEN 500 MG PO TABS: 500.0000 mg | ORAL_TABLET | Freq: Two times a day (BID) | ORAL | Status: DC

## 2015-04-06 MED ORDER — ESCITALOPRAM OXALATE 10 MG PO TABS: 10.0000 mg | ORAL_TABLET | Freq: Every day | ORAL | Status: DC

## 2015-04-06 NOTE — Progress Notes (Signed)
Subjective:    Patient ID: Meagan Holmes, female    DOB: 1941/02/27, 74 y.o.   MRN: [id number]  Chief Complaint  Patient presents with  . Follow-up    Does not think welbutrin is working for her     HPI:  Meagan Holmes is a 74 y.o. female who  has a past medical history of Allergic rhinitis; Hypothyroidism; Osteopenia; Hematuria (2004); COPD (chronic obstructive pulmonary disease) ([place]) (10/02/2010); and Anxiety and depression (02/10/2007). and presents today for a follow up office visit.  1.)  Depression and anxiety - Recently started on Wellbutrin for depression and anxiety. Takes the medication as prescribed and denies adverse side effects. Does not believe the Wellbutrin is working as she continues to experience similar feelings. Continues to experience   2.) Shoulder/arm pain - this is a new problem. Associated symptom of pain located in her right upper arm and shoulder has been going going on for about a month. Pain is described as a constant pain that is worse at night. Pain wakes her from her sleep at night. Denies any treatments in attempts to improve the symptoms. Denies trauma to the area or sounds/sensations heard or felt. There is discomfort with overhead activities.   No Known Allergies   Current Outpatient Prescriptions on File Prior to Visit  Medication Sig Dispense Refill  . aspirin 81 MG tablet Take 81 mg by mouth daily.    [name] atorvastatin (LIPITOR) 80 MG tablet Take 80mg  on Monday, Wednesday and Friday.  Take 40mg  all other days. 30 tablet 11  . calcium gluconate 500 MG tablet Take 500 mg by mouth daily.      . Cholecalciferol (VITAMIN D3 PO) Take 2,000 Units by mouth daily.     [name] levothyroxine (SYNTHROID, LEVOTHROID) 88 MCG tablet TAKE 1 TABLET (88 MCG TOTAL) BY MOUTH DAILY BEFORE BREAKFAST. 30 tablet 2  . LORazepam (ATIVAN) 0.5 MG tablet TAKE 1 TABLET BY MOUTH TWICE A DAY AS NEEDED FOR ANXIETY 20 tablet 0  . pantoprazole (PROTONIX) 40 MG tablet Take 1 tablet (40  mg total) by mouth daily. 30 tablet 3   No current facility-administered medications on file prior to visit.    Past Medical History  Diagnosis Date  . Allergic rhinitis     a lot better after septoplasty- remote  . Hypothyroidism   . Osteopenia   . Hematuria 2004    (-) CT abd-pelvis (-) cystoscopy  . COPD (chronic obstructive pulmonary disease) ([place]) 10/02/2010  . Anxiety and depression 02/10/2007  l   Review of Systems  Musculoskeletal:       Positive for right shoulder pain  Psychiatric/Behavioral: Positive for dysphoric mood. Negative for suicidal ideas and sleep disturbance. The patient is not nervous/anxious.       Objective:    BP 124/80 mmHg  Pulse 60  Temp(Src) 97.9 F (36.6 C) (Oral)  Resp 18  Ht 5\' 3"  (1.6 m)  Wt 160 lb 12.8 oz (72.938 kg)  BMI 28.49 kg/m2  SpO2 96% Nursing note and vital signs reviewed.  Physical Exam  Constitutional: She is oriented to person, place, and time. She appears well-developed and well-nourished. No distress.  Cardiovascular: Normal rate, regular rhythm, normal heart sounds and intact distal pulses.   Pulmonary/Chest: Effort normal and breath sounds normal.  Musculoskeletal:  Right shoulder no obvious deformity, discoloration, or edema noted. Mild tenderness elicited over subacromial space along insertion of supraspinatus tendon. Active range of motion is limited to 90 in flexion and  abduction. Passive range of motion is limited in flexion and abduction secondary to pain. Distal pulses, sensation, and reflexes are intact and appropriate. Apprehension test is negative; nears impingement is positive; Michel Bickers is questionably positive. Empty can is positive.  Neurological: She is alert and oriented to person, place, and time.  Skin: Skin is warm and dry.  Psychiatric: She has a normal mood and affect. Her behavior is normal. Judgment and thought content normal.       Assessment & Plan:   Problem List Items Addressed This  Visit      Other   Anxiety and depression    Depression remains labile with current medication regimen and no positive improvements. Discontinue Wellbutrin and start Lexapro. Denies suicidal ideations. Follow-up if symptoms worsen or fail to improve.      Relevant Medications   escitalopram (LEXAPRO) 10 MG tablet   Right shoulder pain - Primary    Right shoulder pain with concern for rotator cuff pathology although cannot rule out subacromial bursitis calcific tendinitis. Obtain x-rays to rule out calcification. Refill naproxen. Start home exercise therapy. Follow-up with sports medicine for imaging to rule out rotator cuff tear. Follow-up if symptoms worsen or fail to improve.      Relevant Medications   naproxen (NAPROSYN) 500 MG tablet   Other Relevant Orders   DG Shoulder Right   Ambulatory referral to Sports Medicine

## 2015-04-06 NOTE — Progress Notes (Signed)
Pre visit review using our clinic review tool, if applicable. No additional management support is needed unless otherwise documented below in the visit note. 

## 2015-04-06 NOTE — Assessment & Plan Note (Signed)
Right shoulder pain with concern for rotator cuff pathology although cannot rule out subacromial bursitis calcific tendinitis. Obtain x-rays to rule out calcification. Refill naproxen. Start home exercise therapy. Follow-up with sports medicine for imaging to rule out rotator cuff tear. Follow-up if symptoms worsen or fail to improve.

## 2015-04-06 NOTE — Assessment & Plan Note (Signed)
Depression remains labile with current medication regimen and no positive improvements. Discontinue Wellbutrin and start Lexapro. Denies suicidal ideations. Follow-up if symptoms worsen or fail to improve.

## 2015-04-06 NOTE — Patient Instructions (Signed)
Thank you for choosing [facility].  Summary/Instructions:  Your prescription(s) have been submitted to your pharmacy or been printed and provided for you. Please take as directed and contact our office if you believe you are having problem(s) with the medication(s) or have any questions.  If your symptoms worsen or fail to improve, please contact our office for further instruction, or in case of emergency go directly to the emergency room at the closest medical facility.    Generic Shoulder Exercises EXERCISES  RANGE OF MOTION (ROM) AND STRETCHING EXERCISES These exercises may help you when beginning to rehabilitate your injury. Your symptoms may resolve with or without further involvement from your physician, physical therapist or athletic trainer. While completing these exercises, remember:   Restoring tissue flexibility helps normal motion to return to the joints. This allows healthier, less painful movement and activity.  An effective stretch should be held for at least 30 seconds.  A stretch should never be painful. You should only feel a gentle lengthening or release in the stretched tissue. ROM - Pendulum  Bend at the waist so that your right / left arm falls away from your body. Support yourself with your opposite hand on a solid surface, such as a table or a countertop.  Your right / left arm should be perpendicular to the ground. If it is not perpendicular, you need to lean over farther. Relax the muscles in your right / left arm and shoulder as much as possible.  Gently sway your hips and trunk so they move your right / left arm without any use of your right / left shoulder muscles.  Progress your movements so that your right / left arm moves side to side, then forward and backward, and finally, both clockwise and counterclockwise.  Complete __________ repetitions in each direction. Many people use this exercise to relieve discomfort in their shoulder as well as to  gain range of motion. Repeat __________ times. Complete this exercise __________ times per day. STRETCH - Flexion, Standing  Stand with good posture. With an underhand grip on your right / left hand and an overhand grip on the opposite hand, grasp a broomstick or cane so that your hands are a little more than shoulder-width apart.  Keeping your right / left elbow straight and shoulder muscles relaxed, push the stick with your opposite hand to raise your right / left arm in front of your body and then overhead. Raise your arm until you feel a stretch in your right / left shoulder, but before you have increased shoulder pain.  Try to avoid shrugging your right / left shoulder as your arm rises by keeping your shoulder blade tucked down and toward your mid-back spine. Hold __________ seconds.  Slowly return to the starting position. Repeat __________ times. Complete this exercise __________ times per day. STRETCH - Internal Rotation  Place your right / left hand behind your back, palm-up.  Throw a towel or belt over your opposite shoulder. Grasp the towel/belt with your right / left hand.  While keeping an upright posture, gently pull up on the towel/belt until you feel a stretch in the front of your right / left shoulder.  Avoid shrugging your right / left shoulder as your arm rises by keeping your shoulder blade tucked down and toward your mid-back spine.  Hold __________. Release the stretch by lowering your opposite hand. Repeat __________ times. Complete this exercise __________ times per day. STRETCH - External Rotation and Abduction  Stagger your stance  through a doorframe. It does not matter which foot is forward.  As instructed by your physician, physical therapist or athletic trainer, place your hands:  And forearms above your head and on the door frame.  And forearms at head-height and on the door frame.  At elbow-height and on the door frame.  Keeping your head and chest  upright and your stomach muscles tight to prevent over-extending your low-back, slowly shift your weight onto your front foot until you feel a stretch across your chest and/or in the front of your shoulders.  Hold __________ seconds. Shift your weight to your back foot to release the stretch. Repeat __________ times. Complete this stretch __________ times per day.  STRENGTHENING EXERCISES  These exercises may help you when beginning to rehabilitate your injury. They may resolve your symptoms with or without further involvement from your physician, physical therapist or athletic trainer. While completing these exercises, remember:   Muscles can gain both the endurance and the strength needed for everyday activities through controlled exercises.  Complete these exercises as instructed by your physician, physical therapist or athletic trainer. Progress the resistance and repetitions only as guided.  You may experience muscle soreness or fatigue, but the pain or discomfort you are trying to eliminate should never worsen during these exercises. If this pain does worsen, stop and make certain you are following the directions exactly. If the pain is still present after adjustments, discontinue the exercise until you can discuss the trouble with your clinician.  If advised by your physician, during your recovery, avoid activity or exercises which involve actions that place your right / left hand or elbow above your head or behind your back or head. These positions stress the tissues which are trying to heal. STRENGTH - Scapular Depression and Adduction  With good posture, sit on a firm chair. Supported your arms in front of you with pillows, arm rests or a table top. Have your elbows in line with the sides of your body.  Gently draw your shoulder blades down and toward your mid-back spine. Gradually increase the tension without tensing the muscles along the top of your shoulders and the back of your  neck.  Hold for __________ seconds. Slowly release the tension and relax your muscles completely before completing the next repetition.  After you have practiced this exercise, remove the arm support and complete it in standing as well as sitting. Repeat __________ times. Complete this exercise __________ times per day.  STRENGTH - External Rotators  Secure a rubber exercise band/tubing to a fixed object so that it is at the same height as your right / left elbow when you are standing or sitting on a firm surface.  Stand or sit so that the secured exercise band/tubing is at your side that is not injured.  Bend your elbow 90 degrees. Place a folded towel or small pillow under your right / left arm so that your elbow is a few inches away from your side.  Keeping the tension on the exercise band/tubing, pull it away from your body, as if pivoting on your elbow. Be sure to keep your body steady so that the movement is only coming from your shoulder rotating.  Hold __________ seconds. Release the tension in a controlled manner as you return to the starting position. Repeat __________ times. Complete this exercise __________ times per day.  STRENGTH - Supraspinatus  Stand or sit with good posture. Grasp a __________ weight or an exercise band/tubing so that your  hand is "thumbs-up," like when you shake hands.  Slowly lift your right / left hand from your thigh into the air, traveling about 30 degrees from straight out at your side. Lift your hand to shoulder height or as far as you can without increasing any shoulder pain. Initially, many people do not lift their hands above shoulder height.  Avoid shrugging your right / left shoulder as your arm rises by keeping your shoulder blade tucked down and toward your mid-back spine.  Hold for __________ seconds. Control the descent of your hand as you slowly return to your starting position. Repeat __________ times. Complete this exercise __________  times per day.  STRENGTH - Shoulder Extensors  Secure a rubber exercise band/tubing so that it is at the height of your shoulders when you are either standing or sitting on a firm arm-less chair.  With a thumbs-up grip, grasp an end of the band/tubing in each hand. Straighten your elbows and lift your hands straight in front of you at shoulder height. Step back away from the secured end of band/tubing until it becomes tense.  Squeezing your shoulder blades together, pull your hands down to the sides of your thighs. Do not allow your hands to go behind you.  Hold for __________ seconds. Slowly ease the tension on the band/tubing as you reverse the directions and return to the starting position. Repeat __________ times. Complete this exercise __________ times per day.  STRENGTH - Scapular Retractors  Secure a rubber exercise band/tubing so that it is at the height of your shoulders when you are either standing or sitting on a firm arm-less chair.  With a palm-down grip, grasp an end of the band/tubing in each hand. Straighten your elbows and lift your hands straight in front of you at shoulder height. Step back away from the secured end of band/tubing until it becomes tense.  Squeezing your shoulder blades together, draw your elbows back as you bend them. Keep your upper arm lifted away from your body throughout the exercise.  Hold __________ seconds. Slowly ease the tension on the band/tubing as you reverse the directions and return to the starting position. Repeat __________ times. Complete this exercise __________ times per day. STRENGTH - Scapular Depressors  Find a sturdy chair without wheels, such as a from a dining room table.  Keeping your feet on the floor, lift your bottom from the seat and lock your elbows.  Keeping your elbows straight, allow gravity to pull your body weight down. Your shoulders will rise toward your ears.  Raise your body against gravity by drawing your  shoulder blades down your back, shortening the distance between your shoulders and ears. Although your feet should always maintain contact with the floor, your feet should progressively support less body weight as you get stronger.  Hold __________ seconds. In a controlled and slow manner, lower your body weight to begin the next repetition. Repeat __________ times. Complete this exercise __________ times per day.    This information is not intended to replace advice given to you by your health care provider. Make sure you discuss any questions you have with your health care provider.   Document Released: 02/27/2005 Document Revised: 05/06/2014 Document Reviewed: 07/28/2008 Elsevier Interactive Patient Education [employer].

## 2015-04-08 ENCOUNTER — Telehealth: Payer: Self-pay | Admitting: Family

## 2015-04-08 NOTE — Telephone Encounter (Signed)
Please inform patient there is mild osteoarthritic changes but this would not result in the pain she is experiencing. Therefore please follow up with Sports Medicine for imaging.

## 2015-04-12 NOTE — Telephone Encounter (Signed)
Pt followed up with Sports medicine

## 2015-04-25 ENCOUNTER — Telehealth: Payer: Self-pay | Admitting: Family

## 2015-04-25 NOTE — Telephone Encounter (Signed)
Pt was prescribed escitalopram (LEXAPRO) 10 MG tablet [id number] and she feels she wants to get off this kind of medication all together. She has been very tired and she's just laying around gaining weight. She has 10 pills left and is wondering if that will be enough to ween herself off of it.

## 2015-04-25 NOTE — Telephone Encounter (Signed)
Please advise 

## 2015-04-26 NOTE — Telephone Encounter (Signed)
Pt states the pain medication is working well for the pain she was having and she is going to wait before seeing sports medicine

## 2015-04-26 NOTE — Telephone Encounter (Signed)
Yes, she can wean off the medication with the remaining doses. Please take 5 mg daily until gone.

## 2015-04-26 NOTE — Telephone Encounter (Signed)
lmovm to call back.

## 2015-05-04 DIAGNOSIS — R351 Nocturia: Secondary | ICD-10-CM | POA: Diagnosis not present

## 2015-05-04 DIAGNOSIS — R350 Frequency of micturition: Secondary | ICD-10-CM | POA: Diagnosis not present

## 2015-05-04 DIAGNOSIS — Z0000 Encounter for general adult medical examination without abnormal findings: Secondary | ICD-10-CM | POA: Diagnosis not present

## 2015-05-04 DIAGNOSIS — N3946 Mixed incontinence: Secondary | ICD-10-CM | POA: Diagnosis not present

## 2015-05-04 DIAGNOSIS — N8111 Cystocele, midline: Secondary | ICD-10-CM | POA: Diagnosis not present

## 2015-05-09 ENCOUNTER — Other Ambulatory Visit: Payer: Self-pay | Admitting: Family

## 2015-05-10 ENCOUNTER — Other Ambulatory Visit: Payer: Self-pay

## 2015-05-10 MED ORDER — LEVOTHYROXINE SODIUM 88 MCG PO TABS: ORAL_TABLET | ORAL | Status: DC

## 2015-06-04 ENCOUNTER — Other Ambulatory Visit: Payer: Self-pay | Admitting: Family

## 2015-06-06 DIAGNOSIS — H2512 Age-related nuclear cataract, left eye: Secondary | ICD-10-CM | POA: Diagnosis not present

## 2015-06-06 DIAGNOSIS — H18411 Arcus senilis, right eye: Secondary | ICD-10-CM | POA: Diagnosis not present

## 2015-06-06 DIAGNOSIS — H2511 Age-related nuclear cataract, right eye: Secondary | ICD-10-CM | POA: Diagnosis not present

## 2015-06-06 DIAGNOSIS — H18412 Arcus senilis, left eye: Secondary | ICD-10-CM | POA: Diagnosis not present

## 2015-06-06 DIAGNOSIS — H40003 Preglaucoma, unspecified, bilateral: Secondary | ICD-10-CM | POA: Diagnosis not present

## 2015-06-07 ENCOUNTER — Other Ambulatory Visit: Payer: Self-pay | Admitting: Internal Medicine

## 2015-06-07 DIAGNOSIS — I6522 Occlusion and stenosis of left carotid artery: Secondary | ICD-10-CM

## 2015-06-14 ENCOUNTER — Ambulatory Visit: Payer: Medicare Other | Admitting: Cardiology

## 2015-06-14 ENCOUNTER — Inpatient Hospital Stay (HOSPITAL_COMMUNITY): Admission: RE | Admit: 2015-06-14 | Payer: Medicare Other | Source: Ambulatory Visit

## 2015-06-22 ENCOUNTER — Encounter: Payer: Self-pay | Admitting: Internal Medicine

## 2015-06-22 ENCOUNTER — Ambulatory Visit (INDEPENDENT_AMBULATORY_CARE_PROVIDER_SITE_OTHER): Payer: Medicare Other | Admitting: Internal Medicine

## 2015-06-22 VITALS — BP 110/62 | HR 74 | Temp 98.3°F | Resp 20 | Wt 157.0 lb

## 2015-06-22 DIAGNOSIS — R062 Wheezing: Secondary | ICD-10-CM | POA: Insufficient documentation

## 2015-06-22 DIAGNOSIS — J069 Acute upper respiratory infection, unspecified: Secondary | ICD-10-CM

## 2015-06-22 MED ORDER — LEVOFLOXACIN 250 MG PO TABS: 250.0000 mg | ORAL_TABLET | Freq: Every day | ORAL | Status: DC

## 2015-06-22 MED ORDER — PREDNISONE 10 MG PO TABS: ORAL_TABLET | ORAL | Status: DC

## 2015-06-22 MED ORDER — HYDROCODONE-HOMATROPINE 5-1.5 MG/5ML PO SYRP
5.0000 mL | ORAL_SOLUTION | Freq: Four times a day (QID) | ORAL | Status: DC | PRN
Start: 2015-06-22 — End: 2015-09-13

## 2015-06-22 NOTE — Assessment & Plan Note (Signed)
Mild to mod, for predpac asd, cough med prn,  to f/u any worsening symptoms or concerns 

## 2015-06-22 NOTE — Progress Notes (Signed)
Pre visit review using our clinic review tool, if applicable. No additional management support is needed unless otherwise documented below in the visit note. 

## 2015-06-22 NOTE — Progress Notes (Signed)
Subjective:    Patient ID: Meagan Holmes, female    DOB: 10-07-1940, 75 y.o.   MRN: [id number]  HPI  Here with acute onset mild to mod 2-3 days ST, HA, general weakness and malaise, with prod cough greenish sputum, but Pt denies chest pain, increased sob or doe, wheezing, orthopnea, PND, increased LE swelling, palpitations, dizziness or syncope, except for onset mild wheezing/sob last PM.  Pt denies new neurological symptoms such as new headache, or facial or extremity weakness or numbness   Pt denies polydipsia, polyuria, or low sugar symptoms such as weakness or confusion improved with po intake.  Pt states overall good compliance with meds, trying to follow lower cholesterol, diabetic diet, wt overall stable but little exercise however.    Past Medical History  Diagnosis Date  . Allergic rhinitis     a lot better after septoplasty- remote  . Hypothyroidism   . Osteopenia   . Hematuria 2004    (-) CT abd-pelvis (-) cystoscopy  . COPD (chronic obstructive pulmonary disease) ([place]) 10/02/2010  . Anxiety and depression 02/10/2007   Past Surgical History  Procedure Laterality Date  . Appendectomy    . Thyroidectomy    . Carpal tunnel release    . Nasal septum surgery      reports that she has been smoking Cigarettes.  She started smoking about 3 years ago. She has a 25 pack-year smoking history. She has never used smokeless tobacco. She reports that she does not drink alcohol or use illicit drugs. family history includes Colon cancer in her other; Diabetes in her mother; Heart attack in her father; Hypertension in her mother. There is no history of Breast cancer, Cancer, Heart disease, or Stroke. Allergies  Allergen Reactions  . Amoxil [Amoxicillin] Hives   Current Outpatient Prescriptions on File Prior to Visit  Medication Sig Dispense Refill  . aspirin 81 MG tablet Take 81 mg by mouth daily.    [name] atorvastatin (LIPITOR) 80 MG tablet Take 80mg  on Monday, Wednesday and Friday.  Take  40mg  all other days. 30 tablet 11  . calcium gluconate 500 MG tablet Take 500 mg by mouth daily.      . Cholecalciferol (VITAMIN D3 PO) Take 2,000 Units by mouth daily.     [name] escitalopram (LEXAPRO) 10 MG tablet Take 1 tablet (10 mg total) by mouth daily. 30 tablet 1  . levothyroxine (SYNTHROID, LEVOTHROID) 88 MCG tablet TAKE 1 TABLET (88 MCG TOTAL) BY MOUTH DAILY BEFORE BREAKFAST. 30 tablet 2  . levothyroxine (SYNTHROID, LEVOTHROID) 88 MCG tablet TAKE 1 TABLET (88 MCG TOTAL) BY MOUTH DAILY BEFORE BREAKFAST. 30 tablet 2  . LORazepam (ATIVAN) 0.5 MG tablet TAKE 1 TABLET BY MOUTH TWICE A DAY AS NEEDED FOR ANXIETY 20 tablet 0  . naproxen (NAPROSYN) 500 MG tablet TAKE 1 TABLET (500 MG TOTAL) BY MOUTH 2 (TWO) TIMES DAILY WITH A MEAL. 30 tablet 0  . pantoprazole (PROTONIX) 40 MG tablet Take 1 tablet (40 mg total) by mouth daily. 30 tablet 3   No current facility-administered medications on file prior to visit.   Review of Systems  All otherwise neg per pt      Objective:   Physical Exam BP 110/62 mmHg  Pulse 74  Temp(Src) 98.3 F (36.8 C) (Oral)  Resp 20  Wt 157 lb (71.215 kg)  SpO2 97% VS noted, mild ill appearing Constitutional: Pt appears in no significant distress HENT: Head: NCAT.  Right Ear: External ear normal.  Left Ear:  External ear normal.  Bilat tm's with mild erythema.  Max sinus areas non tender.  Pharynx with mild erythema, no exudate Eyes: . Pupils are equal, round, and reactive to light. Conjunctivae and EOM are normal Neck: Normal range of motion. Neck supple.  Cardiovascular: Normal rate and regular rhythm.   Pulmonary/Chest: Effort normal and breath sounds decreased bilat without rales but with few bilat wheezing.  Neurological: Pt is alert. Not confused , motor grossly intact Skin: Skin is warm. No rash, no LE edema Psychiatric: Pt behavior is normal. No agitation.     Assessment & Plan:

## 2015-06-22 NOTE — Patient Instructions (Signed)
Please take all new medication as prescribed - the antibiotic, cough medicine, and prednisone ° °Please continue all other medications as before, and refills have been done if requested. ° °Please have the pharmacy call with any other refills you may need. ° °Please keep your appointments with your specialists as you may have planned ° ° ° °

## 2015-06-22 NOTE — Assessment & Plan Note (Signed)
Mild to mod, for antibx course,  to f/u any worsening symptoms or concerns 

## 2015-06-23 ENCOUNTER — Telehealth: Payer: Self-pay | Admitting: *Deleted

## 2015-06-23 NOTE — Telephone Encounter (Signed)
Pt advised and agreed 

## 2015-06-23 NOTE — Telephone Encounter (Signed)
Received call pt stated saw Dr. Jenny Reichmann yesterday he rx prednisone & antibiotic wanting to see if md can rx mouth wash to help with sore throat as well...Meagan Holmes

## 2015-06-23 NOTE — Telephone Encounter (Signed)
Normally we would advise trying chloraseptic otc to start with for the pain, and even tyleno as needed as well, thanks

## 2015-08-01 ENCOUNTER — Other Ambulatory Visit: Payer: Self-pay | Admitting: Cardiology

## 2015-08-01 ENCOUNTER — Encounter: Payer: Self-pay | Admitting: Cardiology

## 2015-08-01 NOTE — Telephone Encounter (Signed)
atorvastatin (LIPITOR) 80 MG tablet  Medication   Date: 03/30/2015  Department: [address] Office  Ordering/Authorizing: [name], MD      Order Providers    Prescribing Provider Encounter Provider   [name], MD Stephannie Peters, CMA    Medication Detail      Disp Refills Start End     atorvastatin (LIPITOR) 80 MG tablet 30 tablet 11 03/30/2015     Sig: Take 80mg  on Monday, Wednesday and Friday. Take 40mg  all other days.    Notes to Pharmacy: New dose    E-Prescribing Status: Receipt confirmed by pharmacy (03/30/2015 1:54 PM EST)     Pharmacy    [id number] - [place], [address]   Will have to change amount dispensed, due to the every other day 2 tabs taking.

## 2015-08-08 ENCOUNTER — Other Ambulatory Visit: Payer: Self-pay | Admitting: General Practice

## 2015-08-08 MED ORDER — PANTOPRAZOLE SODIUM 40 MG PO TBEC: 40.0000 mg | DELAYED_RELEASE_TABLET | Freq: Every day | ORAL | Status: DC

## 2015-09-04 ENCOUNTER — Ambulatory Visit (HOSPITAL_COMMUNITY)
Admission: RE | Admit: 2015-09-04 | Discharge: 2015-09-04 | Disposition: A | Discharge status: Home / Self Care | Payer: Medicare Other | Source: Ambulatory Visit | Attending: Cardiology | Admitting: Cardiology

## 2015-09-04 DIAGNOSIS — I6522 Occlusion and stenosis of left carotid artery: Secondary | ICD-10-CM | POA: Diagnosis present

## 2015-09-04 DIAGNOSIS — I6523 Occlusion and stenosis of bilateral carotid arteries: Secondary | ICD-10-CM | POA: Diagnosis not present

## 2015-09-13 ENCOUNTER — Encounter: Payer: Self-pay | Admitting: Cardiology

## 2015-09-13 ENCOUNTER — Ambulatory Visit (INDEPENDENT_AMBULATORY_CARE_PROVIDER_SITE_OTHER): Payer: Medicare Other | Admitting: Cardiology

## 2015-09-13 VITALS — BP 112/60 | HR 67 | Ht 63.5 in | Wt 161.8 lb

## 2015-09-13 DIAGNOSIS — I779 Disorder of arteries and arterioles, unspecified: Secondary | ICD-10-CM

## 2015-09-13 DIAGNOSIS — R011 Cardiac murmur, unspecified: Secondary | ICD-10-CM | POA: Diagnosis not present

## 2015-09-13 DIAGNOSIS — E785 Hyperlipidemia, unspecified: Secondary | ICD-10-CM | POA: Diagnosis not present

## 2015-09-13 DIAGNOSIS — F17210 Nicotine dependence, cigarettes, uncomplicated: Secondary | ICD-10-CM | POA: Diagnosis not present

## 2015-09-13 DIAGNOSIS — I739 Peripheral vascular disease, unspecified: Principal | ICD-10-CM

## 2015-09-13 LAB — CBC
HCT: 40.9 % (ref 35.0–45.0)
Hemoglobin: 13.5 g/dL (ref 11.7–15.5)
MCH: 30.5 pg (ref 27.0–33.0)
MCHC: 33 g/dL (ref 32.0–36.0)
MCV: 92.3 fL (ref 80.0–100.0)
MPV: 11.1 fL (ref 7.5–12.5)
Platelets: 250 10*3/uL (ref 140–400)
RBC: 4.43 MIL/uL (ref 3.80–5.10)
RDW: 14.2 % (ref 11.0–15.0)
WBC: 7.7 10*3/uL (ref 3.8–10.8)

## 2015-09-13 LAB — LIPID PANEL
Cholesterol: 125 mg/dL (ref 125–200)
HDL: 38 mg/dL — ABNORMAL LOW (ref >=46)
LDL Cholesterol: 68 mg/dL (ref <130)
Total CHOL/HDL Ratio: 3.3 Ratio (ref <=5.0)
Triglycerides: 96 mg/dL (ref <150)
VLDL: 19 mg/dL (ref <30)

## 2015-09-13 LAB — BASIC METABOLIC PANEL
BUN: 15 mg/dL (ref 7–25)
CO2: 28 mmol/L (ref 20–31)
Calcium: 9.5 mg/dL (ref 8.6–10.4)
Chloride: 105 mmol/L (ref 98–110)
Creat: 0.79 mg/dL (ref 0.60–0.93)
Glucose, Bld: 109 mg/dL — ABNORMAL HIGH (ref 65–99)
Potassium: 4.8 mmol/L (ref 3.5–5.3)
Sodium: 140 mmol/L (ref 135–146)

## 2015-09-13 LAB — TSH: TSH: 1.56 mIU/L

## 2015-09-13 LAB — HEPATIC FUNCTION PANEL
ALT: 27 U/L (ref 6–29)
AST: 28 U/L (ref 10–35)
Albumin: 4 g/dL (ref 3.6–5.1)
Alkaline Phosphatase: 42 U/L (ref 33–130)
Bilirubin, Direct: 0.1 mg/dL (ref <=0.2)
Indirect Bilirubin: 0.5 mg/dL (ref 0.2–1.2)
Total Bilirubin: 0.6 mg/dL (ref 0.2–1.2)
Total Protein: 7.2 g/dL (ref 6.1–8.1)

## 2015-09-13 NOTE — Patient Instructions (Signed)
Your physician recommends that you continue on your current medications as directed. Please refer to the Current Medication list given to you today. Your physician recommends that you return for lab work in: TODAY (BMET, CBC, TSH, LFT)  Your physician wants you to follow-up in: [place].  You will receive a reminder letter in the mail two months in advance. If you don't receive a letter, please call our office to schedule the follow-up appointment.

## 2015-09-13 NOTE — Progress Notes (Signed)
Cardiology Office Note   Date:  09/13/2015   ID:  Meagan Holmes, DOB 09/20/1940, MRN [id number]  PCP:  [name], FNP  Cardiologist: [name] MD --> Meagan Holmes   History of Present Illness: Meagan Holmes is a 75 y.o. female who presents for 3 month follow-up office visit  Meagan Holmes is a 75 y.o. female who is a medical patient of Dr. Elna Holmes.We had seen her for the first time about 3 months ago.  He was seen at that time because of an abnormal carotid duplex ultrasound.  The patient is a former smoker.  Recently she has been smoking vapor cigarettes. He has had known carotid bruits and has had recent carotid duplex ultrasound. Her most recent carotid duplex on 06/08/14 showed an increase in the degree of stenosis in the left carotid artery. Whereas previously it had been 40-59%, this time it was 60-79%. The right internal carotid artery was stable with a mild stenosis between 1 and 39%. The patient has not had any symptoms of TIAs such as scintillating scotomata or transient speech disturbance or motor weakness. She does not have any history of ischemic heart disease. She has not been expressing any chest pain or left arm discomfort. She does have some exertional dyspnea. This may have been from her previous cigarette habit. The patient has tried to stay more healthy recently. She drops her great-granddaughter off at the childcare facility and then goes to [employer] 5 days a week at 7:30 AM to exercise. The patient is retired. She retired in 2004. She previously worked in Psychologist, educational. Patient is not diabetic. She does have a history of hypercholesterolemia and her most recent LDL was 119 in December 2015. She has not been on a low cholesterol diet. She eats a lot of cheese and meat products. She had been taking Lipitor 40 mg daily.  At her last visit we increased that to 80 mg daily.  She has not been experiencing any myalgias or side effects. Her family  history reveals that her father died at age 79 of a heart attack. Mother died at 18 of complications from diabetes.  09/13/15 - transition from [name], 1 year follow up, no complaints, no chest pain, DOE, she is active taking care of her 67 year old granddaughter bout doesn't exercise or walk. She continues to smoke about 5 cigarettes per day, wishes to quit and plans to purchase nicotine patches again. No LE edema, orthopnea, PND, palpitations or syncope. Tolerating higher dose of atorvastatin without problem.   Past Medical History  Diagnosis Date  . Allergic rhinitis     a lot better after septoplasty- remote  . Hypothyroidism   . Osteopenia   . Hematuria 2004    (-) CT abd-pelvis (-) cystoscopy  . COPD (chronic obstructive pulmonary disease) ([place]) 10/02/2010  . Anxiety and depression 02/10/2007    Past Surgical History  Procedure Laterality Date  . Appendectomy    . Thyroidectomy    . Carpal tunnel release    . Nasal septum surgery       Current Outpatient Prescriptions  Medication Sig Dispense Refill  . aspirin 81 MG tablet Take 81 mg by mouth daily.    [name] atorvastatin (LIPITOR) 80 MG tablet Take 40-80 mg by mouth as directed.    . calcium gluconate 500 MG tablet Take 500 mg by mouth daily.      . Cholecalciferol (VITAMIN D3 PO) Take 2,000 Units by mouth daily.     [name]  levofloxacin (LEVAQUIN) 250 MG tablet Take 1 tablet (250 mg total) by mouth daily. 10 tablet 0  . levothyroxine (SYNTHROID, LEVOTHROID) 88 MCG tablet TAKE 1 TABLET (88 MCG TOTAL) BY MOUTH DAILY BEFORE BREAKFAST. 30 tablet 2  . pantoprazole (PROTONIX) 40 MG tablet Take 1 tablet (40 mg total) by mouth daily. 30 tablet 3  . VESICARE 5 MG tablet Take 5 mg by mouth daily as needed. For over active bladder  11   No current facility-administered medications for this visit.    Allergies:   Amoxil    Social History:  The patient  reports that she has been smoking Cigarettes.  She started smoking about 3 years ago.  She has a 25 pack-year smoking history. She has never used smokeless tobacco. She reports that she does not drink alcohol or use illicit drugs.   Family History:  The patient's family history includes Colon cancer in her other; Diabetes in her mother; Heart attack in her father; Hypertension in her mother. There is no history of Breast cancer, Cancer, Heart disease, or Stroke.    ROS:  Please see the history of present illness.   Otherwise, review of systems are positive for none.   All other systems are reviewed and negative.    PHYSICAL EXAM: VS:  BP 112/60 mmHg  Pulse 67  Ht 5' 3.5" (1.613 m)  Wt 161 lb 12.8 oz (73.392 kg)  BMI 28.21 kg/m2  SpO2 97% , BMI Body mass index is 28.21 kg/(m^2). GEN: Well nourished, well developed, in no acute distress HEENT: normal. Neck: no JVD, soft left carotid bruit. Cardiac: RRR; soft systolic ejection murmur at the base. No rubs, or gallops,no edema  Respiratory:  clear to auscultation bilaterally, normal work of breathing GI: soft, nontender, nondistended, + BS MS: no deformity or atrophy Skin: warm and dry, no rash Neuro:  Strength and sensation are intact Psych: euthymic mood, full affect   EKG:  EKG is not ordered today.    Recent Labs: 11/08/2014: ALT 74*; BUN 16; Creatinine, Ser 0.73; Potassium 3.8; Sodium 140    Lipid Panel    Component Value Date/Time   CHOL 118 11/08/2014 1132   TRIG 68.0 11/08/2014 1132   TRIG 83 02/25/2006 1312   HDL 42.40 11/08/2014 1132   CHOLHDL 3 11/08/2014 1132   CHOLHDL 5.5 CALC 02/25/2006 1312   VLDL 13.6 11/08/2014 1132   LDLCALC 62 11/08/2014 1132   LDLDIRECT 171.2 03/19/2012 1129   LDLDIRECT 156.8 02/25/2006 1312      Wt Readings from Last 3 Encounters:  09/13/15 161 lb 12.8 oz (73.392 kg)  06/22/15 157 lb (71.215 kg)  04/06/15 160 lb 12.8 oz (72.938 kg)        ASSESSMENT AND PLAN: 1. Bilateral carotid artery stenosis. Left internal carotid artery is more severe than the  right.There is regression on the left side now in the 40-59% range from 60-79%, we will repeat in a year and continue aggressive therapy with statins. She is not having any TIA or stroke symptoms 2. Systolic heart murmur.  Normal echocardiogram on 07/12/14.The echocardiogram was normal. There was trivial mitral regurgitation but no significant problem with any of the valves. The left ventricular function was normal. 3. Hypercholesterolemia - rechecked today, all at goal with normal LFTs, I would continue the same dosage.  4. Former tobacco use.  Unfortunately still smokes occasionally.  She is going to ry patches again.   Current medicines are reviewed at length with the patient today.  The patient does not have concerns regarding medicines.  The following changes have been made:  no change  Labs/ tests ordered today include:   No orders of the defined types were placed in this encounter.   Follow up in 1 year.  Signed, Ena Dawley, Spoke with pt and informed her of lab results and new orders for Lipitor 80mg  Mon, Wed and Fri and 40mg  the other days. Pt verbalized understanding and was in agreement with this plan.MD 09/13/2015 9:53 AM    [facility] [place], [place], [place]  [id number] Phone: [phone]; Fax: [phone]

## 2015-09-14 DIAGNOSIS — F17210 Nicotine dependence, cigarettes, uncomplicated: Secondary | ICD-10-CM | POA: Insufficient documentation

## 2015-09-14 DIAGNOSIS — R011 Cardiac murmur, unspecified: Secondary | ICD-10-CM | POA: Insufficient documentation

## 2015-09-26 ENCOUNTER — Other Ambulatory Visit: Payer: Self-pay | Admitting: Family

## 2015-11-20 DIAGNOSIS — Z1231 Encounter for screening mammogram for malignant neoplasm of breast: Secondary | ICD-10-CM | POA: Diagnosis not present

## 2015-11-20 LAB — HM MAMMOGRAPHY

## 2015-11-22 ENCOUNTER — Encounter: Payer: Self-pay | Admitting: Family

## 2015-12-13 ENCOUNTER — Ambulatory Visit: Payer: Medicare Other | Admitting: Family

## 2015-12-14 ENCOUNTER — Encounter: Payer: Self-pay | Admitting: Family

## 2015-12-14 ENCOUNTER — Ambulatory Visit (INDEPENDENT_AMBULATORY_CARE_PROVIDER_SITE_OTHER): Payer: Medicare Other | Admitting: Family

## 2015-12-14 VITALS — BP 140/68 | HR 71 | Temp 97.9°F | Resp 14 | Ht 63.5 in | Wt 156.0 lb

## 2015-12-14 DIAGNOSIS — M810 Age-related osteoporosis without current pathological fracture: Secondary | ICD-10-CM | POA: Diagnosis not present

## 2015-12-14 DIAGNOSIS — M779 Enthesopathy, unspecified: Secondary | ICD-10-CM

## 2015-12-14 DIAGNOSIS — M778 Other enthesopathies, not elsewhere classified: Secondary | ICD-10-CM | POA: Diagnosis not present

## 2015-12-14 HISTORY — DX: Other enthesopathies, not elsewhere classified: M77.8

## 2015-12-14 MED ORDER — MELOXICAM 15 MG PO TABS: 15.0000 mg | ORAL_TABLET | Freq: Every day | ORAL | 0 refills | Status: DC

## 2015-12-14 NOTE — Patient Instructions (Signed)
Thank you for choosing [facility].  Summary/Instructions:  Please start the meloxicam daily as needed for inflammation. Do not take any additional ibuprofen or Aleve when taking the meloxicam.  Pennsaid 2 times per day to the affected area about half pack.  Exercises daily.  Ice and moist heat 20 minutes every 2 hours as needed. Recommend ice before bed.  They will call to schedule your bone density tests.  Your prescription(s) have been submitted to your pharmacy or been printed and provided for you. Please take as directed and contact our office if you believe you are having problem(s) with the medication(s) or have any questions.  If your symptoms worsen or fail to improve, please contact our office for further instruction, or in case of emergency go directly to the emergency room at the closest medical facility.

## 2015-12-14 NOTE — Progress Notes (Signed)
Subjective:    Patient ID: Meagan Holmes, female    DOB: 04-Feb-1941, 75 y.o.   MRN: [id number]  Chief Complaint  Patient presents with  . Arm Pain    left arm pain that gets worse at night, bone density, feels tight when she bends her arm     HPI:  Meagan Holmes is a 75 y.o. female who  has a past medical history of Allergic rhinitis; Anxiety and depression (02/10/2007); COPD (chronic obstructive pulmonary disease) ([place]) (10/02/2010); Hematuria (2004); Hypothyroidism; and Osteopenia. and presents today for an office visit.  1.) Arm pain - This is a new problem. Associated symptom of pain located in her left arm has been going on for about 1 month and goes from her shoulder to her elbow. Pain is described as feeling a little tight. No trauma or injury that she can recall. Denies any numbness/tingling or decrease strength. Modifying factors include Tylenol and naproxen which do not help very much.   2.) Bone density - Previously diagnosed with low bone mineral density and improved with the calcium and vitamin D. She is due for an updated DEXA.  Allergies  Allergen Reactions  . Amoxil [Amoxicillin] Hives     Current Outpatient Prescriptions on File Prior to Visit  Medication Sig Dispense Refill  . aspirin 81 MG tablet Take 81 mg by mouth daily.    [name] atorvastatin (LIPITOR) 80 MG tablet Take 40-80 mg by mouth as directed.    . calcium gluconate 500 MG tablet Take 500 mg by mouth daily.      . Cholecalciferol (VITAMIN D3 PO) Take 2,000 Units by mouth daily.     [name] levothyroxine (SYNTHROID, LEVOTHROID) 88 MCG tablet TAKE 1 TABLET (88 MCG TOTAL) BY MOUTH DAILY BEFORE BREAKFAST. 30 tablet 2  . VESICARE 5 MG tablet Take 5 mg by mouth daily as needed. For over active bladder  11   No current facility-administered medications on file prior to visit.      Past Surgical History:  Procedure Laterality Date  . APPENDECTOMY    . CARPAL TUNNEL RELEASE    . NASAL SEPTUM SURGERY    .  THYROIDECTOMY      Past Medical History:  Diagnosis Date  . Allergic rhinitis    a lot better after septoplasty- remote  . Anxiety and depression 02/10/2007  . COPD (chronic obstructive pulmonary disease) ([place]) 10/02/2010  . Hematuria 2004   (-) CT abd-pelvis (-) cystoscopy  . Hypothyroidism   . Osteopenia      Review of Systems  Constitutional: Negative for chills and fever.  Musculoskeletal:       Positive for left arm pain  Neurological: Negative for weakness and numbness.      Objective:    BP 140/68 (BP Location: Right Arm, Patient Position: Sitting, Cuff Size: Normal)   Pulse 71   Temp 97.9 F (36.6 C) (Oral)   Resp 14   Ht 5' 3.5" (1.613 m)   Wt 156 lb (70.8 kg)   SpO2 97%   BMI 27.20 kg/m  Nursing note and vital signs reviewed.  Physical Exam  Constitutional: She is oriented to person, place, and time. She appears well-developed and well-nourished. No distress.  Cardiovascular: Normal rate, regular rhythm, normal heart sounds and intact distal pulses.   Pulmonary/Chest: Effort normal and breath sounds normal.  Musculoskeletal:  Left upper extremity - no obvious deformity, discoloration, or edema noted. Palpable tenderness along triceps muscle group with no deformities or crepitus  present. Range of motion within normal limits. Strength is normal. There is mild discomfort with elbow extension. Distal pulses and sensation are intact and appropriate.  Neurological: She is alert and oriented to person, place, and time.  Skin: Skin is warm and dry.  Psychiatric: She has a normal mood and affect. Her behavior is normal. Judgment and thought content normal.       Assessment & Plan:   Problem List Items Addressed This Visit      Musculoskeletal and Integument   Osteoporosis - Primary    Most recent DEXA with improvement to osteopenia with current regimen. Obtain DEXA to update bone mineral density status. Continue current calcium and vitamin D pending DEXA results.        Relevant Orders   DG Bone Density   Triceps tendonitis    Symptoms and exam. Consistent with triceps tendinitis as pain is mostly posterior and occurs primary with motion. Treat conservatively with ice/moist heat, meloxicam, and home exercise therapy. Follow-up if symptoms worsen or do not improve.      Relevant Medications   meloxicam (MOBIC) 15 MG tablet    Other Visit Diagnoses   None.      I have discontinued Ms. Lesinski levofloxacin and pantoprazole. I am also having her start on meloxicam. Additionally, I am having her maintain her calcium gluconate, Cholecalciferol (VITAMIN D3 PO), aspirin, VESICARE, atorvastatin, and levothyroxine.   Meds ordered this encounter  Medications  . meloxicam (MOBIC) 15 MG tablet    Sig: Take 1 tablet (15 mg total) by mouth daily.    Dispense:  30 tablet    Refill:  0    Order Specific Question:   Supervising Provider    Answer:   Pricilla Holm A [id number]     Follow-up: Return if symptoms worsen or fail to improve.  [name], FNP

## 2015-12-14 NOTE — Assessment & Plan Note (Addendum)
Symptoms and exam. Consistent with triceps tendinitis as pain is mostly posterior and occurs primary with motion. Treat conservatively with ice/moist heat, meloxicam, and home exercise therapy. Follow-up if symptoms worsen or do not improve.

## 2015-12-14 NOTE — Assessment & Plan Note (Signed)
Most recent DEXA with improvement to osteopenia with current regimen. Obtain DEXA to update bone mineral density status. Continue current calcium and vitamin D pending DEXA results.

## 2015-12-24 ENCOUNTER — Other Ambulatory Visit: Payer: Self-pay | Admitting: Family

## 2015-12-25 ENCOUNTER — Other Ambulatory Visit: Payer: Self-pay | Admitting: *Deleted

## 2015-12-25 MED ORDER — ATORVASTATIN CALCIUM 80 MG PO TABS: 40.0000 mg | ORAL_TABLET | ORAL | 1 refills | Status: DC

## 2016-01-09 DIAGNOSIS — H2511 Age-related nuclear cataract, right eye: Secondary | ICD-10-CM | POA: Diagnosis not present

## 2016-01-09 DIAGNOSIS — H18412 Arcus senilis, left eye: Secondary | ICD-10-CM | POA: Diagnosis not present

## 2016-01-09 DIAGNOSIS — H18411 Arcus senilis, right eye: Secondary | ICD-10-CM | POA: Diagnosis not present

## 2016-01-09 DIAGNOSIS — H40003 Preglaucoma, unspecified, bilateral: Secondary | ICD-10-CM | POA: Diagnosis not present

## 2016-01-09 DIAGNOSIS — H2512 Age-related nuclear cataract, left eye: Secondary | ICD-10-CM | POA: Diagnosis not present

## 2016-01-10 ENCOUNTER — Other Ambulatory Visit: Payer: Self-pay | Admitting: Family

## 2016-01-10 DIAGNOSIS — M779 Enthesopathy, unspecified: Principal | ICD-10-CM

## 2016-01-10 DIAGNOSIS — M778 Other enthesopathies, not elsewhere classified: Secondary | ICD-10-CM

## 2016-01-17 ENCOUNTER — Other Ambulatory Visit: Payer: Medicare Other

## 2016-01-22 ENCOUNTER — Ambulatory Visit (INDEPENDENT_AMBULATORY_CARE_PROVIDER_SITE_OTHER)
Admission: RE | Admit: 2016-01-22 | Discharge: 2016-01-22 | Disposition: A | Discharge status: Home / Self Care | Payer: Medicare Other | Source: Ambulatory Visit | Attending: Family | Admitting: Family

## 2016-01-22 DIAGNOSIS — M810 Age-related osteoporosis without current pathological fracture: Secondary | ICD-10-CM

## 2016-02-02 ENCOUNTER — Telehealth: Payer: Self-pay

## 2016-02-02 NOTE — Telephone Encounter (Signed)
Left message advising patient that insurance has been verified for prolia injections---estimated $275 copay---this is first injection, so patient can be scheduled at earliest convenience---can talk with Saira Kramme if any questions

## 2016-02-09 ENCOUNTER — Telehealth: Payer: Self-pay

## 2016-02-09 NOTE — Telephone Encounter (Signed)
Left message asking patient to call back either today or I will return in one week to help answer any questions she has with prolia vs actonel

## 2016-02-09 NOTE — Telephone Encounter (Signed)
Pt is requesting an rx for actonel instead of taking prolia injection. States that she read reviews on it and would rather take the actonel that she has taken in the past. Would like [facility].

## 2016-02-20 ENCOUNTER — Telehealth: Payer: Self-pay

## 2016-02-20 MED ORDER — RISEDRONATE SODIUM 150 MG PO TABS: 150.0000 mg | ORAL_TABLET | ORAL | 1 refills | Status: DC

## 2016-02-20 NOTE — Telephone Encounter (Signed)
Routing to greg----patient has been advised of difference between prolia injections and actonel----at this time, shed wants to try actonel again but cant remember the dosage she was on previously-----please advise what dosage actonel for patient so that [facility]----also patient is requesting referral to rheumatologist for her hand----please advise, I will call patient back---thanks

## 2016-02-20 NOTE — Telephone Encounter (Signed)
error 

## 2016-02-20 NOTE — Telephone Encounter (Signed)
Called and left message advising patient to call back to discuss difference with prolia injections vs. actonel---can talk with tamara----need to send in rx for actonel if patient only wants to continue with actonel---if she wants to try prolia injections, insurance will need to be verified

## 2016-02-20 NOTE — Addendum Note (Signed)
Addended by: [name] D on: 02/20/2016 04:29 PM   Modules accepted: Orders

## 2016-02-20 NOTE — Telephone Encounter (Signed)
Actonel sent to pharmacy.

## 2016-02-21 NOTE — Telephone Encounter (Signed)
Advised patient actonel has been sent to pharm---also, per greg---patient needs to schedule office viist to be seen for RA possibility in her hand and also requires lab work in order to send rheumatology referral---patient has scheduled appt for Thursday 10/26

## 2016-02-22 ENCOUNTER — Other Ambulatory Visit (INDEPENDENT_AMBULATORY_CARE_PROVIDER_SITE_OTHER): Payer: Medicare Other

## 2016-02-22 ENCOUNTER — Encounter: Payer: Self-pay | Admitting: Family

## 2016-02-22 ENCOUNTER — Ambulatory Visit (INDEPENDENT_AMBULATORY_CARE_PROVIDER_SITE_OTHER): Payer: Medicare Other | Admitting: Family

## 2016-02-22 VITALS — BP 118/78 | HR 68 | Temp 98.0°F | Wt 154.0 lb

## 2016-02-22 DIAGNOSIS — E039 Hypothyroidism, unspecified: Secondary | ICD-10-CM

## 2016-02-22 DIAGNOSIS — M19042 Primary osteoarthritis, left hand: Secondary | ICD-10-CM

## 2016-02-22 DIAGNOSIS — M19041 Primary osteoarthritis, right hand: Secondary | ICD-10-CM

## 2016-02-22 DIAGNOSIS — M1990 Unspecified osteoarthritis, unspecified site: Secondary | ICD-10-CM | POA: Insufficient documentation

## 2016-02-22 LAB — T3, FREE: T3 FREE: 2.9 pg/mL (ref 2.3–4.2)

## 2016-02-22 LAB — TSH: TSH: 0.95 u[IU]/mL (ref 0.35–4.50)

## 2016-02-22 LAB — SEDIMENTATION RATE: SED RATE: 15 mm/h (ref 0–30)

## 2016-02-22 MED ORDER — LEVOTHYROXINE SODIUM 88 MCG PO TABS: ORAL_TABLET | ORAL | 1 refills | Status: DC

## 2016-02-22 MED ORDER — CELECOXIB 100 MG PO CAPS: 100.0000 mg | ORAL_CAPSULE | Freq: Two times a day (BID) | ORAL | 0 refills | Status: DC | PRN

## 2016-02-22 NOTE — Progress Notes (Signed)
Subjective:    Patient ID: Meagan Holmes, female    DOB: 1941/01/17, 75 y.o.   MRN: [id number]  Chief Complaint  Patient presents with  . Hand Pain  . Hypothyroidism    HPI:  Meagan Holmes is a 75 y.o. female who  has a past medical history of Allergic rhinitis; Anxiety and depression (02/10/2007); COPD (chronic obstructive pulmonary disease) ([place]) (10/02/2010); Hematuria (2004); Hypothyroidism; and Osteopenia. and presents today for an office visit.   1.) Hand pain - This is a new problem. Associated symptom of waxing and waning pain located in her bilateral hands has been going on for several months. Has also noted changing in the direction of her fingers. Denies any modifying factors that make it better or attempted treatments. No previous history of arthritis and no trauma to the area.  She is currently retired and worked putting parts together for 15 years. There is occasional weakness with numbness or tingling.   2.) Hypothyroidism - currently maintained on levothyroxine. Reports taking medication as prescribed and denies adverse side effects. Denies temperature intolerance, changes to hair, fatigue, waking, or changes to skin/hair/nails.  Lab Results  Component Value Date   TSH 0.95 02/22/2016    Allergies  Allergen Reactions  . Amoxil [Amoxicillin] Hives     Outpatient Medications Prior to Visit  Medication Sig Dispense Refill  . aspirin 81 MG tablet Take 81 mg by mouth daily.    [name] atorvastatin (LIPITOR) 80 MG tablet Take 0.5-1 tablets (40-80 mg total) by mouth as directed. 90 tablet 1  . calcium gluconate 500 MG tablet Take 500 mg by mouth daily.      . Cholecalciferol (VITAMIN D3 PO) Take 2,000 Units by mouth daily.     . risedronate (ACTONEL) 150 MG tablet Take 1 tablet (150 mg total) by mouth every 30 (thirty) days. with water on empty stomach, nothing by mouth or lie down for next 30 minutes. 3 tablet 1  . VESICARE 5 MG tablet Take 5 mg by mouth daily as needed.  For over active bladder  11  . levothyroxine (SYNTHROID, LEVOTHROID) 88 MCG tablet TAKE 1 TABLET (88 MCG TOTAL) BY MOUTH DAILY BEFORE BREAKFAST. 30 tablet 2  . meloxicam (MOBIC) 15 MG tablet TAKE 1 TABLET (15 MG TOTAL) BY MOUTH DAILY. 30 tablet 5   No facility-administered medications prior to visit.       Past Surgical History:  Procedure Laterality Date  . APPENDECTOMY    . CARPAL TUNNEL RELEASE    . NASAL SEPTUM SURGERY    . THYROIDECTOMY        Past Medical History:  Diagnosis Date  . Allergic rhinitis    a lot better after septoplasty- remote  . Anxiety and depression 02/10/2007  . COPD (chronic obstructive pulmonary disease) ([place]) 10/02/2010  . Hematuria 2004   (-) CT abd-pelvis (-) cystoscopy  . Hypothyroidism   . Osteopenia       Review of Systems  Constitutional: Negative for chills and fever.  Musculoskeletal:       Positive for bilateral hand pain.  Neurological: Positive for weakness. Negative for numbness.      Objective:    BP 118/78   Pulse 68   Temp 98 F (36.7 C)   Wt 154 lb (69.9 kg)   SpO2 98%   BMI 26.85 kg/m  Nursing note and vital signs reviewed.  Physical Exam  Constitutional: She is oriented to person, place, and time. She appears well-developed and  well-nourished. No distress.  Neck: Neck supple. No thyromegaly present.  Cardiovascular: Normal rate, regular rhythm, normal heart sounds and intact distal pulses.   Pulmonary/Chest: Effort normal and breath sounds normal.  Musculoskeletal:  Bilateral hands - no obvious edema or discoloration with mild deformities noted at the distal interphalangeal joints consistent with Heberden's nodes. Range of motion within normal limits. Capillary refill is intact and appropriate.   Neurological: She is alert and oriented to person, place, and time.  Skin: Skin is warm and dry.  Psychiatric: She has a normal mood and affect. Her behavior is normal. Judgment and thought content normal.         Assessment & Plan:   Problem List Items Addressed This Visit      Endocrine   Hypothyroidism - Primary    Hypothyroidism appears stable with current regimen and no adverse side effects. Obtain TSH and free T3. Continue current dosage of levothyroxine pending blood work results.      Relevant Medications   levothyroxine (SYNTHROID, LEVOTHROID) 88 MCG tablet   Other Relevant Orders   TSH (Completed)   T3, free (Completed)     Musculoskeletal and Integument   Osteoarthritis    Symptoms and exam consistent with osteoarthritis most likely primary brought on by repetitive motions over several years. Obtain ANA, rheumatoid factor, and erythrocyte sedimentation rate to evaluate for autoimmune. Treat conservatively with nonsteroidal anti-inflammatories and start Celebrex and discontinue meloxicam. Discussed nonpharmacological treatments. Referral to rheumatology place per patient request. Continue to monitor.      Relevant Medications   celecoxib (CELEBREX) 100 MG capsule   Other Relevant Orders   Ambulatory referral to Rheumatology   ANA   Rheumatoid factor   Sed Rate (ESR) (Completed)    Other Visit Diagnoses   None.      I have discontinued Ms. Collantes meloxicam. I am also having her start on celecoxib. Additionally, I am having her maintain her calcium gluconate, Cholecalciferol (VITAMIN D3 PO), aspirin, VESICARE, atorvastatin, risedronate, and levothyroxine.   Meds ordered this encounter  Medications  . celecoxib (CELEBREX) 100 MG capsule    Sig: Take 1 capsule (100 mg total) by mouth 2 (two) times daily as needed.    Dispense:  60 capsule    Refill:  0    Order Specific Question:   Supervising Provider    Answer:   Pricilla Holm A [[id number]]  . levothyroxine (SYNTHROID, LEVOTHROID) 88 MCG tablet    Sig: TAKE 1 TABLET (88 MCG TOTAL) BY MOUTH DAILY BEFORE BREAKFAST.    Dispense:  90 tablet    Refill:  1    Order Specific Question:   Supervising Provider    Answer:    Pricilla Holm A [[id number]]     Follow-up: Return in about 2 months (around 04/23/2016), or if symptoms worsen or fail to improve.  [name], FNP

## 2016-02-22 NOTE — Assessment & Plan Note (Addendum)
Hypothyroidism appears stable with current regimen and no adverse side effects. Obtain TSH and free T3. Continue current dosage of levothyroxine pending blood work results.

## 2016-02-22 NOTE — Patient Instructions (Addendum)
Thank you for choosing [facility].  SUMMARY AND INSTRUCTIONS:  Medication:  Your prescription(s) have been submitted to your pharmacy or been printed and provided for you. Please take as directed and contact our office if you believe you are having problem(s) with the medication(s) or have any questions.  Labs:  Please stop by the lab on the lower level of the building for your blood work. Your results will be released to [employer] (or called to you) after review, usually within 72 hours after test completion. If any changes need to be made, you will be notified at that same time.  1.) The lab is open from 7:30am to 5:30 pm Monday-Friday 2.) No appointment is necessary 3.) Fasting (if needed) is 6-8 hours after food and drink; black coffee and water are okay   Follow up:  If your symptoms worsen or fail to improve, please contact our office for further instruction, or in case of emergency go directly to the emergency room at the closest medical facility.   Osteoarthritis Osteoarthritis is a disease that causes soreness and inflammation of a joint. It occurs when the cartilage at the affected joint wears down. Cartilage acts as a cushion, covering the ends of bones where they meet to form a joint. Osteoarthritis is the most common form of arthritis. It often occurs in older people. The joints affected most often by this condition include those in the:  Ends of the fingers.  Thumbs.  Neck.  Lower back.  Knees.  Hips. CAUSES  Over time, the cartilage that covers the ends of bones begins to wear away. This causes bone to rub on bone, producing pain and stiffness in the affected joints.  RISK FACTORS Certain factors can increase your chances of having osteoarthritis, including:  Older age.  Excessive body weight.  Overuse of joints.  Previous joint injury. SIGNS AND SYMPTOMS   Pain, swelling, and stiffness in the joint.  Over time, the joint may lose its normal  shape.  Small deposits of bone (osteophytes) may grow on the edges of the joint.  Bits of bone or cartilage can break off and float inside the joint space. This may cause more pain and damage. DIAGNOSIS  Your health care provider will do a physical exam and ask about your symptoms. Various tests may be ordered, such as:  X-rays of the affected joint.  Blood tests to rule out other types of arthritis. Additional tests may be used to diagnose your condition. TREATMENT  Goals of treatment are to control pain and improve joint function. Treatment plans may include:  A prescribed exercise program that allows for rest and joint relief.  A weight control plan.  Pain relief techniques, such as:  Properly applied heat and cold.  Electric pulses delivered to nerve endings under the skin (transcutaneous electrical nerve stimulation [TENS]).  Massage.  Certain nutritional supplements.  Medicines to control pain, such as:  Acetaminophen.  Nonsteroidal anti-inflammatory drugs (NSAIDs), such as naproxen.  Narcotic or central-acting agents, such as tramadol.  Corticosteroids. These can be given orally or as an injection.  Surgery to reposition the bones and relieve pain (osteotomy) or to remove loose pieces of bone and cartilage. Joint replacement may be needed in advanced states of osteoarthritis. HOME CARE INSTRUCTIONS   Take medicines only as directed by your health care provider.  Maintain a healthy weight. Follow your health care provider's instructions for weight control. This may include dietary instructions.  Exercise as directed. Your health care provider can  recommend specific types of exercise. These may include:  Strengthening exercises. These are done to strengthen the muscles that support joints affected by arthritis. They can be performed with weights or with exercise bands to add resistance.  Aerobic activities. These are exercises, such as brisk walking or  low-impact aerobics, that get your heart pumping.  Range-of-motion activities. These keep your joints limber.  Balance and agility exercises. These help you maintain daily living skills.  Rest your affected joints as directed by your health care provider.  Keep all follow-up visits as directed by your health care provider. SEEK MEDICAL CARE IF:   Your skin turns red.  You develop a rash in addition to your joint pain.  You have worsening joint pain.  You have a fever along with joint or muscle aches. SEEK IMMEDIATE MEDICAL CARE IF:  You have a significant loss of weight or appetite.  You have night sweats. [place] of Arthritis and Musculoskeletal and Skin Diseases: www.niams.SouthExposed.es  [employer] on Aging: http://kim-miller.com/  American College of Rheumatology: www.rheumatology.org   This information is not intended to replace advice given to you by your health care provider. Make sure you discuss any questions you have with your health care provider.   Document Released: 04/15/2005 Document Revised: 05/06/2014 Document Reviewed: 12/21/2012 Elsevier Interactive Patient Education [employer].

## 2016-02-22 NOTE — Assessment & Plan Note (Signed)
Symptoms and exam consistent with osteoarthritis most likely primary brought on by repetitive motions over several years. Obtain ANA, rheumatoid factor, and erythrocyte sedimentation rate to evaluate for autoimmune. Treat conservatively with nonsteroidal anti-inflammatories and start Celebrex and discontinue meloxicam. Discussed nonpharmacological treatments. Referral to rheumatology place per patient request. Continue to monitor.

## 2016-02-23 LAB — ANA: ANA: NEGATIVE

## 2016-02-23 LAB — RHEUMATOID FACTOR: Rhuematoid fact SerPl-aCnc: <14 IU/mL (ref <14)

## 2016-02-28 ENCOUNTER — Ambulatory Visit: Payer: Medicare Other

## 2016-03-04 ENCOUNTER — Other Ambulatory Visit: Payer: Self-pay | Admitting: *Deleted

## 2016-03-04 MED ORDER — LEVOTHYROXINE SODIUM 88 MCG PO TABS: ORAL_TABLET | ORAL | 1 refills | Status: DC

## 2016-03-19 DIAGNOSIS — M25512 Pain in left shoulder: Secondary | ICD-10-CM | POA: Diagnosis not present

## 2016-03-19 DIAGNOSIS — M19041 Primary osteoarthritis, right hand: Secondary | ICD-10-CM | POA: Diagnosis not present

## 2016-03-19 DIAGNOSIS — M19042 Primary osteoarthritis, left hand: Secondary | ICD-10-CM | POA: Diagnosis not present

## 2016-03-19 DIAGNOSIS — M79643 Pain in unspecified hand: Secondary | ICD-10-CM | POA: Diagnosis not present

## 2016-03-19 DIAGNOSIS — M151 Heberden's nodes (with arthropathy): Secondary | ICD-10-CM | POA: Diagnosis not present

## 2016-03-19 DIAGNOSIS — M1990 Unspecified osteoarthritis, unspecified site: Secondary | ICD-10-CM | POA: Diagnosis not present

## 2016-03-20 ENCOUNTER — Other Ambulatory Visit: Payer: Self-pay

## 2016-03-20 MED ORDER — LEVOTHYROXINE SODIUM 88 MCG PO TABS: ORAL_TABLET | ORAL | 1 refills | Status: DC

## 2016-06-26 ENCOUNTER — Other Ambulatory Visit: Payer: Self-pay | Admitting: Cardiology

## 2016-07-31 ENCOUNTER — Telehealth: Payer: Self-pay | Admitting: Family

## 2016-08-05 ENCOUNTER — Other Ambulatory Visit: Payer: Self-pay | Admitting: Family

## 2016-08-08 ENCOUNTER — Ambulatory Visit (INDEPENDENT_AMBULATORY_CARE_PROVIDER_SITE_OTHER): Payer: Medicare Other | Admitting: Family

## 2016-08-08 ENCOUNTER — Encounter: Payer: Self-pay | Admitting: Family

## 2016-08-08 ENCOUNTER — Other Ambulatory Visit (INDEPENDENT_AMBULATORY_CARE_PROVIDER_SITE_OTHER): Payer: Medicare Other

## 2016-08-08 VITALS — BP 124/76 | HR 60 | Temp 98.7°F | Resp 16 | Ht 63.25 in | Wt 156.4 lb

## 2016-08-08 DIAGNOSIS — Z0001 Encounter for general adult medical examination with abnormal findings: Secondary | ICD-10-CM | POA: Diagnosis not present

## 2016-08-08 DIAGNOSIS — M810 Age-related osteoporosis without current pathological fracture: Secondary | ICD-10-CM | POA: Diagnosis not present

## 2016-08-08 DIAGNOSIS — E782 Mixed hyperlipidemia: Secondary | ICD-10-CM

## 2016-08-08 DIAGNOSIS — Z0000 Encounter for general adult medical examination without abnormal findings: Secondary | ICD-10-CM | POA: Insufficient documentation

## 2016-08-08 LAB — CBC
HCT: 42.7 % (ref 36.0–46.0)
HEMOGLOBIN: 14.3 g/dL (ref 12.0–15.0)
MCHC: 33.6 g/dL (ref 30.0–36.0)
MCV: 90.8 fl (ref 78.0–100.0)
PLATELETS: 234 10*3/uL (ref 150.0–400.0)
RBC: 4.7 Mil/uL (ref 3.87–5.11)
RDW: 14.7 % (ref 11.5–15.5)
WBC: 7.4 10*3/uL (ref 4.0–10.5)

## 2016-08-08 LAB — LIPID PANEL
Cholesterol: 130 mg/dL (ref 0–200)
HDL: 40.1 mg/dL (ref >39.00)
LDL Cholesterol: 75 mg/dL (ref 0–99)
NonHDL: 90.32
TRIGLYCERIDES: 79 mg/dL (ref 0.0–149.0)
Total CHOL/HDL Ratio: 3
VLDL: 15.8 mg/dL (ref 0.0–40.0)

## 2016-08-08 LAB — COMPREHENSIVE METABOLIC PANEL
ALBUMIN: 4.2 g/dL (ref 3.5–5.2)
ALK PHOS: 40 U/L (ref 39–117)
ALT: 37 U/L — AB (ref 0–35)
AST: 34 U/L (ref 0–37)
BILIRUBIN TOTAL: 0.6 mg/dL (ref 0.2–1.2)
BUN: 14 mg/dL (ref 6–23)
CALCIUM: 9.8 mg/dL (ref 8.4–10.5)
CO2: 29 mEq/L (ref 19–32)
Chloride: 105 mEq/L (ref 96–112)
Creatinine, Ser: 0.75 mg/dL (ref 0.40–1.20)
GFR: 79.83 mL/min (ref >60.00)
Glucose, Bld: 95 mg/dL (ref 70–99)
Potassium: 4.9 mEq/L (ref 3.5–5.1)
Sodium: 140 mEq/L (ref 135–145)
TOTAL PROTEIN: 7.6 g/dL (ref 6.0–8.3)

## 2016-08-08 LAB — VITAMIN D 25 HYDROXY (VIT D DEFICIENCY, FRACTURES): VITD: 93.39 ng/mL (ref 30.00–100.00)

## 2016-08-08 MED ORDER — ESCITALOPRAM OXALATE 10 MG PO TABS: 10.0000 mg | ORAL_TABLET | Freq: Every day | ORAL | 1 refills | Status: DC

## 2016-08-08 NOTE — Assessment & Plan Note (Signed)
1) Anticipatory Guidance: Discussed importance of wearing a seatbelt while driving and not texting while driving; changing batteries in smoke detector at least once annually; wearing suntan lotion when outside; eating a balanced and moderate diet; getting physical activity at least 30 minutes per day.  2) Immunizations / Screenings / Labs:  All immunizations are up to date per recommendations. Obtain Vitamin D for deficiency screening in osteoporosis. All other screenings are up to date per recommendations. Obtain CBC, CMET, and lipid profile.    Overall well exam with risk factors for cardiovascular disease including hyperlipidemia and tobacco use. Currently maintained on atorvastatin with no myalgias. Tobacco usage qualifies patient with 37.5 pack year history for low dose CT scan for lung cancer with referral placed. Encouraged to quit tobacco use given continued risk for chronic disease and end organ damage in the future. Continue other healthy lifestyle choices and behaviors. Follow up prevention exam in 1 year. Follow up office visit for chronic conditions.

## 2016-08-08 NOTE — Assessment & Plan Note (Signed)
Obtain lipid profile. Continue current dosage of atorvastatin pending lipid profile results.

## 2016-08-08 NOTE — Progress Notes (Signed)
Subjective:    Patient ID: Meagan Holmes, female    DOB: 1940/08/27, 76 y.o.   MRN: [id number]  Chief Complaint  Patient presents with  . CPE    fasting, depression medication    HPI:  Meagan Holmes is a 76 y.o. female who presents today for a Medicare Annual Wellness/Physical exam.    1) Health Maintenance -   Diet - Averages about 2-3 meals per day consisting of a regular diet; Caffeine intake of 3-4 cups daily.   Exercise - 15-20 minutes daily consisting of walking and stretching  2) Preventative Exams / Immunizations:  Dental -- Up to date  Vision -- Up to date   Health Maintenance  Topic Date Due  . INFLUENZA VACCINE  11/27/2016  . TETANUS/TDAP  02/09/2017  . DEXA SCAN  Completed  . PNA vac Low Risk Adult  Completed     Immunization History  Administered Date(s) Administered  . Influenza Split 02/13/2011, 01/21/2012, 02/01/2014  . Influenza Whole 02/10/2007, 02/17/2008, 02/17/2009, 01/17/2010  . Influenza, High Dose Seasonal PF 01/17/2013  . Influenza-Unspecified 02/06/2015, 01/28/2016  . Pneumococcal Conjugate-13 04/12/2014  . Pneumococcal Polysaccharide-23 02/17/2008, 02/01/2014  . Td 02/10/2007    RISK FACTORS  Tobacco History  Smoking Status  . Current Every Day Smoker  . Packs/day: 0.75  . Years: 50.00  . Types: Cigarettes  . Start date: 04/29/2012  Smokeless Tobacco  . Never Used    Comment: 1/2 ppd     Cardiac risk factors: advanced age (older than 13 for men, 32 for women), dyslipidemia and smoking/ tobacco exposure.  Depression Screen  Depression screen [facility] 2/9 08/08/2016  Decreased Interest 1  Down, Depressed, Hopeless 3  PHQ - 2 Score 4  Altered sleeping 2  Tired, decreased energy 0  Change in appetite 0  Feeling bad or failure about yourself  2  Trouble concentrating 1  Moving slowly or fidgety/restless 0  Suicidal thoughts 0  PHQ-9 Score 9  Difficult doing work/chores Somewhat difficult     Activities of Daily  Living In your present state of health, do you have any difficulty performing the following activities?:  Driving? No Managing money?  No Feeding yourself? No Getting from bed to chair? No Climbing a flight of stairs? No Preparing food and eating?: No Bathing or showering? No Getting dressed: No Getting to the toilet? No Using the toilet: No Moving around from place to place: No In the past year have you fallen or had a near fall?:No   Home Safety Has smoke detector and wears seat belts. No firearms. No excess sun exposure. Are there smokers in your home (other than you)?  No Do you feel safe at home?  Yes  Hearing Difficulties: No Do you often ask people to speak up or repeat themselves? No Do you experience ringing or noises in your ears? No  Do you have difficulty understanding soft or whispered voices? No    Cognitive Testing  Alert? Yes   Normal Appearance? Yes  Oriented to person? Yes  Place? Yes   Time? Yes  Recall of three objects?  Yes  Can perform simple calculations? Yes  Displays appropriate judgment? Yes  Can read the correct time from a watch face? Yes  Do you feel that you have a problem with memory? No  Do you often misplace items? No   Advanced Directives have been discussed with the patient? Yes   Current Physicians/Providers and Suppliers  1. Terri Piedra, FNP -  Internal Medicine 2. Ena Dawley, MD - Cardiology  Indicate any recent Medical Services you may have received from other than [facility] providers in the past year (date may be approximate).  All answers were reviewed with the patient and necessary referrals were made:  [name], [place]   08/08/2016    Allergies  Allergen Reactions  . Amoxil [Amoxicillin] Hives     Outpatient Medications Prior to Visit  Medication Sig Dispense Refill  . aspirin 81 MG tablet Take 81 mg by mouth daily.    [name] atorvastatin (LIPITOR) 80 MG tablet TAKE 0.5-1 TABLETS (40-80 MG TOTAL) BY MOUTH AS DIRECTED.  90 tablet 0  . calcium gluconate 500 MG tablet Take 500 mg by mouth daily.      . celecoxib (CELEBREX) 100 MG capsule Take 1 capsule (100 mg total) by mouth 2 (two) times daily as needed. 60 capsule 0  . Cholecalciferol (VITAMIN D3 PO) Take 2,000 Units by mouth daily.     [name] levothyroxine (SYNTHROID, LEVOTHROID) 88 MCG tablet TAKE 1 TABLET (88 MCG TOTAL) BY MOUTH DAILY BEFORE BREAKFAST. 90 tablet 1  . risedronate (ACTONEL) 150 MG tablet TAKE 1 TABLET BY MOUTH MONTHLY WITH WATER ON EMPTY STOMACH, DO NOT LIE DOWN FOR 30 MIN 3 tablet 1  . VESICARE 5 MG tablet Take 5 mg by mouth daily as needed. For over active bladder  11   No facility-administered medications prior to visit.      Past Medical History:  Diagnosis Date  . Allergic rhinitis    a lot better after septoplasty- remote  . Anxiety and depression 02/10/2007  . COPD (chronic obstructive pulmonary disease) ([place]) 10/02/2010  . Hematuria 2004   (-) CT abd-pelvis (-) cystoscopy  . Hypothyroidism   . Osteopenia      Past Surgical History:  Procedure Laterality Date  . APPENDECTOMY    . CARPAL TUNNEL RELEASE    . NASAL SEPTUM SURGERY    . THYROIDECTOMY       Family History  Problem Relation Age of Onset  . Heart attack Father   . Diabetes Mother   . Hypertension Mother   . Colon cancer Other     uncle, age?  . Breast cancer Neg Hx   . Cancer Neg Hx   . Heart disease Neg Hx   . Stroke Neg Hx      Social History   Social History  . Marital status: Married    Spouse name: N/A  . Number of children: 3  . Years of education: 12   Occupational History  . retired    Social History Main Topics  . Smoking status: Current Every Day Smoker    Packs/day: 0.75    Years: 50.00    Types: Cigarettes    Start date: 04/29/2012  . Smokeless tobacco: Never Used     Comment: 1/2 ppd  . Alcohol use No  . Drug use: No  . Sexual activity: Not on file   Other Topics Concern  . Not on file   Social History Narrative   Lost  son (to drugs) 2008     Husband ill, she takes care of him, raising her g-daughter    Born and raised in [place], [place]. Currently resides in a house with her husband and greatgrandaughter. Maltese dog. Fun: play games with family, gardening, gym   Denies religious beliefs that would effect health care.  Review of Systems  Constitutional: Denies fever, chills, fatigue, or significant weight gain/loss. HENT: Head: Denies headache or neck pain Ears: Denies changes in hearing, ringing in ears, earache, drainage Nose: Denies discharge, stuffiness, itching, nosebleed, sinus pain Throat: Denies sore throat, hoarseness, dry mouth, sores, thrush Eyes: Denies loss/changes in vision, pain, redness, blurry/double vision, flashing lights Cardiovascular: Denies chest pain/discomfort, tightness, palpitations, shortness of breath with activity, difficulty lying down, swelling, sudden awakening with shortness of breath Respiratory: Denies shortness of breath, cough, sputum production, wheezing Gastrointestinal: Denies dysphasia, heartburn, change in appetite, nausea, change in bowel habits, rectal bleeding, constipation, diarrhea, yellow skin or eyes Genitourinary: Denies frequency, urgency, burning/pain, blood in urine, incontinence, change in urinary strength. Musculoskeletal: Denies muscle/joint pain, stiffness, back pain, redness or swelling of joints, trauma Skin: Denies rashes, lumps, itching, dryness, color changes, or hair/nail changes Neurological: Denies dizziness, fainting, seizures, weakness, numbness, tingling, tremor Psychiatric - Denies nervousness, stress, depression or memory loss Endocrine: Denies heat or cold intolerance, sweating, frequent urination, excessive thirst, changes in appetite Hematologic: Denies ease of bruising or bleeding    Objective:     BP 124/76 (BP Location: Left Arm, Patient Position: Sitting, Cuff Size: Normal)   Pulse 60   Temp 98.7 F (37.1  C) (Oral)   Resp 16   Ht 5' 3.25" (1.607 m)   Wt 156 lb 6.4 oz (70.9 kg)   SpO2 94%   BMI 27.49 kg/m  Nursing note and vital signs reviewed.  Physical Exam  Constitutional: She is oriented to person, place, and time. She appears well-developed and well-nourished.  HENT:  Head: Normocephalic.  Right Ear: Hearing, tympanic membrane, external ear and ear canal normal.  Left Ear: Hearing, tympanic membrane, external ear and ear canal normal.  Nose: Nose normal.  Mouth/Throat: Uvula is midline, oropharynx is clear and moist and mucous membranes are normal.  Eyes: Conjunctivae and EOM are normal. Pupils are equal, round, and reactive to light.  Neck: Neck supple. No JVD present. No tracheal deviation present. No thyromegaly present.  Cardiovascular: Normal rate, regular rhythm, normal heart sounds and intact distal pulses.   Pulmonary/Chest: Effort normal and breath sounds normal.  Abdominal: Soft. Bowel sounds are normal. She exhibits no distension and no mass. There is no tenderness. There is no rebound and no guarding.  Musculoskeletal: Normal range of motion. She exhibits no edema or tenderness.  Lymphadenopathy:    She has no cervical adenopathy.  Neurological: She is alert and oriented to person, place, and time. She has normal reflexes. No cranial nerve deficit. She exhibits normal muscle tone. Coordination normal.  Skin: Skin is warm and dry.  Psychiatric: She has a normal mood and affect. Her behavior is normal. Judgment and thought content normal.       Assessment & Plan:   During the course of the visit the patient was educated and counseled about appropriate screening and preventive services including:    Pneumococcal vaccine   Influenza vaccine  Td vaccine  Colorectal cancer screening  Diabetes screening  Nutrition counseling   Smoking cessation counseling  Diet review for nutrition referral? Yes ____  Not Indicated _X___   Patient Instructions (the  written plan) was given to the patient.  Medicare Attestation I have personally reviewed: The patient's medical and social history Their use of alcohol, tobacco or illicit drugs Their current medications and supplements The patient's functional ability including ADLs,fall risks, home safety risks, cognitive, and hearing and visual impairment Diet and physical activities Evidence for depression  or mood disorders  The patient's weight, height, BMI,  have been recorded in the chart.  I have made referrals, counseling, and provided education to the patient based on review of the above and I have provided the patient with a written personalized care plan for preventive services.     Problem List Items Addressed This Visit      Musculoskeletal and Integument   Osteoporosis    Maintained on Actonel with no adverse side effects and most recent DEXA showing osteopenia. Continue calcium and Vitamin D. Continue current dosage of Actonel.        Other   Hyperlipidemia    Obtain lipid profile. Continue current dosage of atorvastatin pending lipid profile results.       Medicare annual wellness visit, subsequent - Primary    Reviewed and updated patient's medical, surgical, family and social history. Medications and allergies were also reviewed. Basic screenings for depression, activities of daily living, hearing, cognition and safety were performed. Provider list was updated and health plan was provided to the patient.       Encounter for general adult medical examination with abnormal findings    1) Anticipatory Guidance: Discussed importance of wearing a seatbelt while driving and not texting while driving; changing batteries in smoke detector at least once annually; wearing suntan lotion when outside; eating a balanced and moderate diet; getting physical activity at least 30 minutes per day.  2) Immunizations / Screenings / Labs:  All immunizations are up to date per recommendations. Obtain  Vitamin D for deficiency screening in osteoporosis. All other screenings are up to date per recommendations. Obtain CBC, CMET, and lipid profile.    Overall well exam with risk factors for cardiovascular disease including hyperlipidemia and tobacco use. Currently maintained on atorvastatin with no myalgias. Tobacco usage qualifies patient with 37.5 pack year history for low dose CT scan for lung cancer with referral placed. Encouraged to quit tobacco use given continued risk for chronic disease and end organ damage in the future. Continue other healthy lifestyle choices and behaviors. Follow up prevention exam in 1 year. Follow up office visit for chronic conditions.        Relevant Orders   CBC (Completed)   Comprehensive metabolic panel (Completed)   Lipid panel (Completed)   VITAMIN D 25 Hydroxy (Vit-D Deficiency, Fractures) (Completed)       I am having Ms. Paolini start on escitalopram. I am also having her maintain her calcium gluconate, Cholecalciferol (VITAMIN D3 PO), aspirin, VESICARE, celecoxib, levothyroxine, atorvastatin, and risedronate.   Meds ordered this encounter  Medications  . escitalopram (LEXAPRO) 10 MG tablet    Sig: Take 1 tablet (10 mg total) by mouth daily.    Dispense:  30 tablet    Refill:  1    Order Specific Question:   Supervising Provider    Answer:   Pricilla Holm A [[id number]]     Follow-up: Return in about 6 months (around 02/07/2017), or if symptoms worsen or fail to improve.   [name], FNP

## 2016-08-08 NOTE — Patient Instructions (Signed)
Thank you for choosing [facility].  SUMMARY AND INSTRUCTIONS:  Medication:  Your prescription(s) have been submitted to your pharmacy or been printed and provided for you. Please take as directed and contact our office if you believe you are having problem(s) with the medication(s) or have any questions.  Labs:  Please stop by the lab on the lower level of the building for your blood work. Your results will be released to [place] (or called to you) after review, usually within 72 hours after test completion. If any changes need to be made, you will be notified at that same time.  1.) The lab is open from 7:30am to 5:30 pm Monday-Friday 2.) No appointment is necessary 3.) Fasting (if needed) is 6-8 hours after food and drink; black coffee and water are okay   Follow up:  If your symptoms worsen or fail to improve, please contact our office for further instruction, or in case of emergency go directly to the emergency room at the closest medical facility.   Health Maintenance  Topic Date Due  . INFLUENZA VACCINE  11/27/2016  . TETANUS/TDAP  02/09/2017  . DEXA SCAN  Completed  . PNA vac Low Risk Adult  Completed    Health Maintenance, Female Adopting a healthy lifestyle and getting preventive care can go a long way to promote health and wellness. Talk with your health care provider about what schedule of regular examinations is right for you. This is a good chance for you to check in with your provider about disease prevention and staying healthy. In between checkups, there are plenty of things you can do on your own. Experts have done a lot of research about which lifestyle changes and preventive measures are most likely to keep you healthy. Ask your health care provider for more information. Weight and diet Eat a healthy diet  Be sure to include plenty of vegetables, fruits, low-fat dairy products, and lean protein.  Do not eat a lot of foods high in solid fats, added  sugars, or salt.  Get regular exercise. This is one of the most important things you can do for your health.  Most adults should exercise for at least 150 minutes each week. The exercise should increase your heart rate and make you sweat (moderate-intensity exercise).  Most adults should also do strengthening exercises at least twice a week. This is in addition to the moderate-intensity exercise. Maintain a healthy weight  Body mass index (BMI) is a measurement that can be used to identify possible weight problems. It estimates body fat based on height and weight. Your health care provider can help determine your BMI and help you achieve or maintain a healthy weight.  For females 10 years of age and older:  A BMI below 18.5 is considered underweight.  A BMI of 18.5 to 24.9 is normal.  A BMI of 25 to 29.9 is considered overweight.  A BMI of 30 and above is considered obese. Watch levels of cholesterol and blood lipids  You should start having your blood tested for lipids and cholesterol at 76 years of age, then have this test every 5 years.  You may need to have your cholesterol levels checked more often if:  Your lipid or cholesterol levels are high.  You are older than 76 years of age.  You are at high risk for heart disease. Cancer screening Lung Cancer  Lung cancer screening is recommended for adults 55-50 years old who are at high risk for lung cancer  because of a history of smoking.  A yearly low-dose CT scan of the lungs is recommended for people who:  Currently smoke.  Have quit within the past 15 years.  Have at least a 30-pack-year history of smoking. A pack year is smoking an average of one pack of cigarettes a day for 1 year.  Yearly screening should continue until it has been 15 years since you quit.  Yearly screening should stop if you develop a health problem that would prevent you from having lung cancer treatment. Breast Cancer  Practice breast  self-awareness. This means understanding how your breasts normally appear and feel.  It also means doing regular breast self-exams. Let your health care provider know about any changes, no matter how small.  If you are in your 20s or 30s, you should have a clinical breast exam (CBE) by a health care provider every 1-3 years as part of a regular health exam.  If you are 38 or older, have a CBE every year. Also consider having a breast X-ray (mammogram) every year.  If you have a family history of breast cancer, talk to your health care provider about genetic screening.  If you are at high risk for breast cancer, talk to your health care provider about having an MRI and a mammogram every year.  Breast cancer gene (BRCA) assessment is recommended for women who have family members with BRCA-related cancers. BRCA-related cancers include:  Breast.  Ovarian.  Tubal.  Peritoneal cancers.  Results of the assessment will determine the need for genetic counseling and BRCA1 and BRCA2 testing. Cervical Cancer  Your health care provider may recommend that you be screened regularly for cancer of the pelvic organs (ovaries, uterus, and vagina). This screening involves a pelvic examination, including checking for microscopic changes to the surface of your cervix (Pap test). You may be encouraged to have this screening done every 3 years, beginning at age 25.  For women ages 88-65, health care providers may recommend pelvic exams and Pap testing every 3 years, or they may recommend the Pap and pelvic exam, combined with testing for human papilloma virus (HPV), every 5 years. Some types of HPV increase your risk of cervical cancer. Testing for HPV may also be done on women of any age with unclear Pap test results.  Other health care providers may not recommend any screening for nonpregnant women who are considered low risk for pelvic cancer and who do not have symptoms. Ask your health care provider if a  screening pelvic exam is right for you.  If you have had past treatment for cervical cancer or a condition that could lead to cancer, you need Pap tests and screening for cancer for at least 20 years after your treatment. If Pap tests have been discontinued, your risk factors (such as having a new sexual partner) need to be reassessed to determine if screening should resume. Some women have medical problems that increase the chance of getting cervical cancer. In these cases, your health care provider may recommend more frequent screening and Pap tests. Colorectal Cancer  This type of cancer can be detected and often prevented.  Routine colorectal cancer screening usually begins at 76 years of age and continues through 76 years of age.  Your health care provider may recommend screening at an earlier age if you have risk factors for colon cancer.  Your health care provider may also recommend using home test kits to check for hidden blood in the stool.  A  small camera at the end of a tube can be used to examine your colon directly (sigmoidoscopy or colonoscopy). This is done to check for the earliest forms of colorectal cancer.  Routine screening usually begins at age 5.  Direct examination of the colon should be repeated every 5-10 years through 76 years of age. However, you may need to be screened more often if early forms of precancerous polyps or small growths are found. Skin Cancer  Check your skin from head to toe regularly.  Tell your health care provider about any new moles or changes in moles, especially if there is a change in a mole's shape or color.  Also tell your health care provider if you have a mole that is larger than the size of a pencil eraser.  Always use sunscreen. Apply sunscreen liberally and repeatedly throughout the day.  Protect yourself by wearing long sleeves, pants, a wide-brimmed hat, and sunglasses whenever you are outside. Heart disease, diabetes, and high  blood pressure  High blood pressure causes heart disease and increases the risk of stroke. High blood pressure is more likely to develop in:  People who have blood pressure in the high end of the normal range (130-139/85-89 mm Hg).  People who are overweight or obese.  People who are African American.  If you are 81-2 years of age, have your blood pressure checked every 3-5 years. If you are 47 years of age or older, have your blood pressure checked every year. You should have your blood pressure measured twice-once when you are at a hospital or clinic, and once when you are not at a hospital or clinic. Record the average of the two measurements. To check your blood pressure when you are not at a hospital or clinic, you can use:  An automated blood pressure machine at a pharmacy.  A home blood pressure monitor.  If you are between 59 years and 58 years old, ask your health care provider if you should take aspirin to prevent strokes.  Have regular diabetes screenings. This involves taking a blood sample to check your fasting blood sugar level.  If you are at a normal weight and have a low risk for diabetes, have this test once every three years after 76 years of age.  If you are overweight and have a high risk for diabetes, consider being tested at a younger age or more often. Preventing infection Hepatitis B  If you have a higher risk for hepatitis B, you should be screened for this virus. You are considered at high risk for hepatitis B if:  You were born in a country where hepatitis B is common. Ask your health care provider which countries are considered high risk.  Your parents were born in a high-risk country, and you have not been immunized against hepatitis B (hepatitis B vaccine).  You have HIV or AIDS.  You use needles to inject street drugs.  You live with someone who has hepatitis B.  You have had sex with someone who has hepatitis B.  You get hemodialysis  treatment.  You take certain medicines for conditions, including cancer, organ transplantation, and autoimmune conditions. Hepatitis C  Blood testing is recommended for:  Everyone born from [birth year] through 1965.  Anyone with known risk factors for hepatitis C. Sexually transmitted infections (STIs)  You should be screened for sexually transmitted infections (STIs) including gonorrhea and chlamydia if:  You are sexually active and are younger than 76 years of age.  You  are older than 76 years of age and your health care provider tells you that you are at risk for this type of infection.  Your sexual activity has changed since you were last screened and you are at an increased risk for chlamydia or gonorrhea. Ask your health care provider if you are at risk.  If you do not have HIV, but are at risk, it may be recommended that you take a prescription medicine daily to prevent HIV infection. This is called pre-exposure prophylaxis (PrEP). You are considered at risk if:  You are sexually active and do not regularly use condoms or know the HIV status of your partner(s).  You take drugs by injection.  You are sexually active with a partner who has HIV. Talk with your health care provider about whether you are at high risk of being infected with HIV. If you choose to begin PrEP, you should first be tested for HIV. You should then be tested every 3 months for as long as you are taking PrEP. Pregnancy  If you are premenopausal and you may become pregnant, ask your health care provider about preconception counseling.  If you may become pregnant, take 400 to 800 micrograms (mcg) of folic acid every day.  If you want to prevent pregnancy, talk to your health care provider about birth control (contraception). Osteoporosis and menopause  Osteoporosis is a disease in which the bones lose minerals and strength with aging. This can result in serious bone fractures. Your risk for osteoporosis can be  identified using a bone density scan.  If you are 36 years of age or older, or if you are at risk for osteoporosis and fractures, ask your health care provider if you should be screened.  Ask your health care provider whether you should take a calcium or vitamin D supplement to lower your risk for osteoporosis.  Menopause may have certain physical symptoms and risks.  Hormone replacement therapy may reduce some of these symptoms and risks. Talk to your health care provider about whether hormone replacement therapy is right for you. Follow these instructions at home:  Schedule regular health, dental, and eye exams.  Stay current with your immunizations.  Do not use any tobacco products including cigarettes, chewing tobacco, or electronic cigarettes.  If you are pregnant, do not drink alcohol.  If you are breastfeeding, limit how much and how often you drink alcohol.  Limit alcohol intake to no more than 1 drink per day for nonpregnant women. One drink equals 12 ounces of beer, 5 ounces of wine, or 1 ounces of hard liquor.  Do not use street drugs.  Do not share needles.  Ask your health care provider for help if you need support or information about quitting drugs.  Tell your health care provider if you often feel depressed.  Tell your health care provider if you have ever been abused or do not feel safe at home. This information is not intended to replace advice given to you by your health care provider. Make sure you discuss any questions you have with your health care provider. Document Released: 10/29/2010 Document Revised: 09/21/2015 Document Reviewed: 01/17/2015 Elsevier Interactive Patient Education  2017 [employer].

## 2016-08-08 NOTE — Assessment & Plan Note (Signed)
Maintained on Actonel with no adverse side effects and most recent DEXA showing osteopenia. Continue calcium and Vitamin D. Continue current dosage of Actonel.

## 2016-08-08 NOTE — Assessment & Plan Note (Signed)
Reviewed and updated patient's medical, surgical, family and social history. Medications and allergies were also reviewed. Basic screenings for depression, activities of daily living, hearing, cognition and safety were performed. Provider list was updated and health plan was provided to the patient.  

## 2016-08-09 DIAGNOSIS — H40003 Preglaucoma, unspecified, bilateral: Secondary | ICD-10-CM | POA: Diagnosis not present

## 2016-08-09 DIAGNOSIS — H18411 Arcus senilis, right eye: Secondary | ICD-10-CM | POA: Diagnosis not present

## 2016-08-09 DIAGNOSIS — H02839 Dermatochalasis of unspecified eye, unspecified eyelid: Secondary | ICD-10-CM | POA: Diagnosis not present

## 2016-08-09 DIAGNOSIS — H18412 Arcus senilis, left eye: Secondary | ICD-10-CM | POA: Diagnosis not present

## 2016-08-13 ENCOUNTER — Telehealth: Payer: Self-pay | Admitting: Family

## 2016-08-13 ENCOUNTER — Other Ambulatory Visit: Payer: Self-pay | Admitting: Family

## 2016-08-13 DIAGNOSIS — F17210 Nicotine dependence, cigarettes, uncomplicated: Secondary | ICD-10-CM

## 2016-08-13 NOTE — Telephone Encounter (Signed)
Pt states the last visit she discussed going to the pulmonary doctor, she would like a referral put in for Dr. Arrie Eastern or Dr. Pennie Banter. No referral has been put in.

## 2016-08-13 NOTE — Telephone Encounter (Signed)
Referral placed for lung cancer screening. 

## 2016-08-13 NOTE — Telephone Encounter (Signed)
Notified pt referral has been placed../lmb 

## 2016-08-13 NOTE — Progress Notes (Signed)
Referral placed for lung cancer screening. 

## 2016-08-14 ENCOUNTER — Other Ambulatory Visit: Payer: Self-pay | Admitting: Acute Care

## 2016-08-14 DIAGNOSIS — F17210 Nicotine dependence, cigarettes, uncomplicated: Principal | ICD-10-CM

## 2016-08-28 ENCOUNTER — Ambulatory Visit (INDEPENDENT_AMBULATORY_CARE_PROVIDER_SITE_OTHER)
Admission: RE | Admit: 2016-08-28 | Discharge: 2016-08-28 | Disposition: A | Discharge status: Home / Self Care | Payer: Medicare Other | Source: Ambulatory Visit | Attending: Acute Care | Admitting: Acute Care

## 2016-08-28 ENCOUNTER — Encounter: Payer: Self-pay | Admitting: Acute Care

## 2016-08-28 ENCOUNTER — Ambulatory Visit (INDEPENDENT_AMBULATORY_CARE_PROVIDER_SITE_OTHER): Payer: Medicare Other | Admitting: Acute Care

## 2016-08-28 ENCOUNTER — Other Ambulatory Visit: Payer: Self-pay | Admitting: Family

## 2016-08-28 DIAGNOSIS — F17210 Nicotine dependence, cigarettes, uncomplicated: Secondary | ICD-10-CM | POA: Diagnosis not present

## 2016-08-28 DIAGNOSIS — Z87891 Personal history of nicotine dependence: Secondary | ICD-10-CM | POA: Diagnosis not present

## 2016-08-28 NOTE — Progress Notes (Signed)
Shared Decision Making Visit Lung Cancer Screening Program [phone])   Eligibility:  Age 76 y.o.  Pack Years Smoking History Calculation 43.5 pack year smoking history (# packs/per year x # years smoked)  Recent History of coughing up blood  no  Unexplained weight loss? no ( >Than 15 pounds within the last 6 months )  Prior History Lung / other cancer no (Diagnosis within the last 5 years already requiring surveillance chest CT Scans).  Smoking Status Current Smoker  Former Smokers: Years since quit: Not applicable  Quit Date: Not applicable  Visit Components:  Discussion included one or more decision making aids. yes  Discussion included risk/benefits of screening. yes  Discussion included potential follow up diagnostic testing for abnormal scans. yes  Discussion included meaning and risk of over diagnosis. yes  Discussion included meaning and risk of False Positives. yes  Discussion included meaning of total radiation exposure. yes  Counseling Included:  Importance of adherence to annual lung cancer LDCT screening. yes  Impact of comorbidities on ability to participate in the program. yes  Ability and willingness to under diagnostic treatment. yes  Smoking Cessation Counseling:  Current Smokers:   Discussed importance of smoking cessation. yes  Information about tobacco cessation classes and interventions provided to patient. yes  Patient provided with "ticket" for LDCT Scan. yes  Symptomatic Patient. no  Counseling not applicable  Diagnosis Code: Tobacco Use Z72.0  Asymptomatic Patient yes  Counseling (Intermediate counseling: > three minutes counseling) K5993  Former Smokers:   Discussed the importance of maintaining cigarette abstinence. yes  Diagnosis Code: Personal History of Nicotine Dependence. T70.177  Information about tobacco cessation classes and interventions provided to patient. Yes  Patient provided with "ticket" for LDCT Scan.  yes  Written Order for Lung Cancer Screening with LDCT placed in Epic. Yes (CT Chest Lung Cancer Screening Low Dose W/O CM) [id number] Z12.2-Screening of respiratory organs Z87.891-Personal history of nicotine dependence  I have spent 25 minutes of face to face time with Meagan Holmes discussing the risks and benefits of lung cancer screening. We viewed a power point together that explained in detail the above noted topics. We paused at intervals to allow for questions to be asked and answered to ensure understanding.We discussed that the single most powerful action that she can take to decrease her risk of developing lung cancer is to quit smoking. We discussed whether or not she is ready to commit to setting a quit date. She is currently not ready to set a quit date. We discussed options for tools to aid in quitting smoking including nicotine replacement therapy, non-nicotine medications, support groups, Quit Smart classes, and behavior modification. We discussed that often times setting smaller, more achievable goals, such as eliminating 1 cigarette a day for a week and then 2 cigarettes a day for a week can be helpful in slowly decreasing the number of cigarettes smoked. This allows for a sense of accomplishment as well as providing a clinical benefit. I gave her the " Be Stronger Than Your Excuses" card with contact information for community resources, classes, free nicotine replacement therapy, and access to mobile apps, text messaging, and on-line smoking cessation help. I have also given her my card and contact information in the event she needs to contact me. We discussed the time and location of the scan, and that either [name] RN or I will call with the results once we  receive them. I have offered her  a copy of the power point  we viewed  as a resource in the event they need reinforcement of the concepts we discussed today in the office. The patient verbalized understanding of all of  the above  and had no further questions upon leaving the office. They have my contact information in the event they have any further questions.  I spent 4 minutes counseling on smoking cessation and the health risks of continued tobacco abuse.  I explained to the patient that there has been a high incidence of coronary artery disease noted on these exams. I explained that this is a non-gated exam therefore degree or severity cannot be determined. This patient is not on statin therapy. I have asked the patient to follow-up with their PCP regarding any incidental finding of coronary artery disease and management with diet or medication as their PCP  feels is clinically indicated. The patient verbalized understanding of the above and had no further questions upon completion of the visit.     Magdalen Spatz, NP 08/28/2016

## 2016-09-02 ENCOUNTER — Other Ambulatory Visit: Payer: Self-pay | Admitting: Acute Care

## 2016-09-02 DIAGNOSIS — F17210 Nicotine dependence, cigarettes, uncomplicated: Principal | ICD-10-CM

## 2016-09-16 ENCOUNTER — Other Ambulatory Visit: Payer: Self-pay | Admitting: Family

## 2016-09-16 DIAGNOSIS — I6523 Occlusion and stenosis of bilateral carotid arteries: Secondary | ICD-10-CM

## 2016-09-20 IMAGING — CR DG CHEST 2V
2 series · 2 of 2 positions shown · non-contrast
Comparison: 08/29/2009.

CLINICAL DATA: COPD.

EXAM:
CHEST  2 VIEW

[w chest pa]
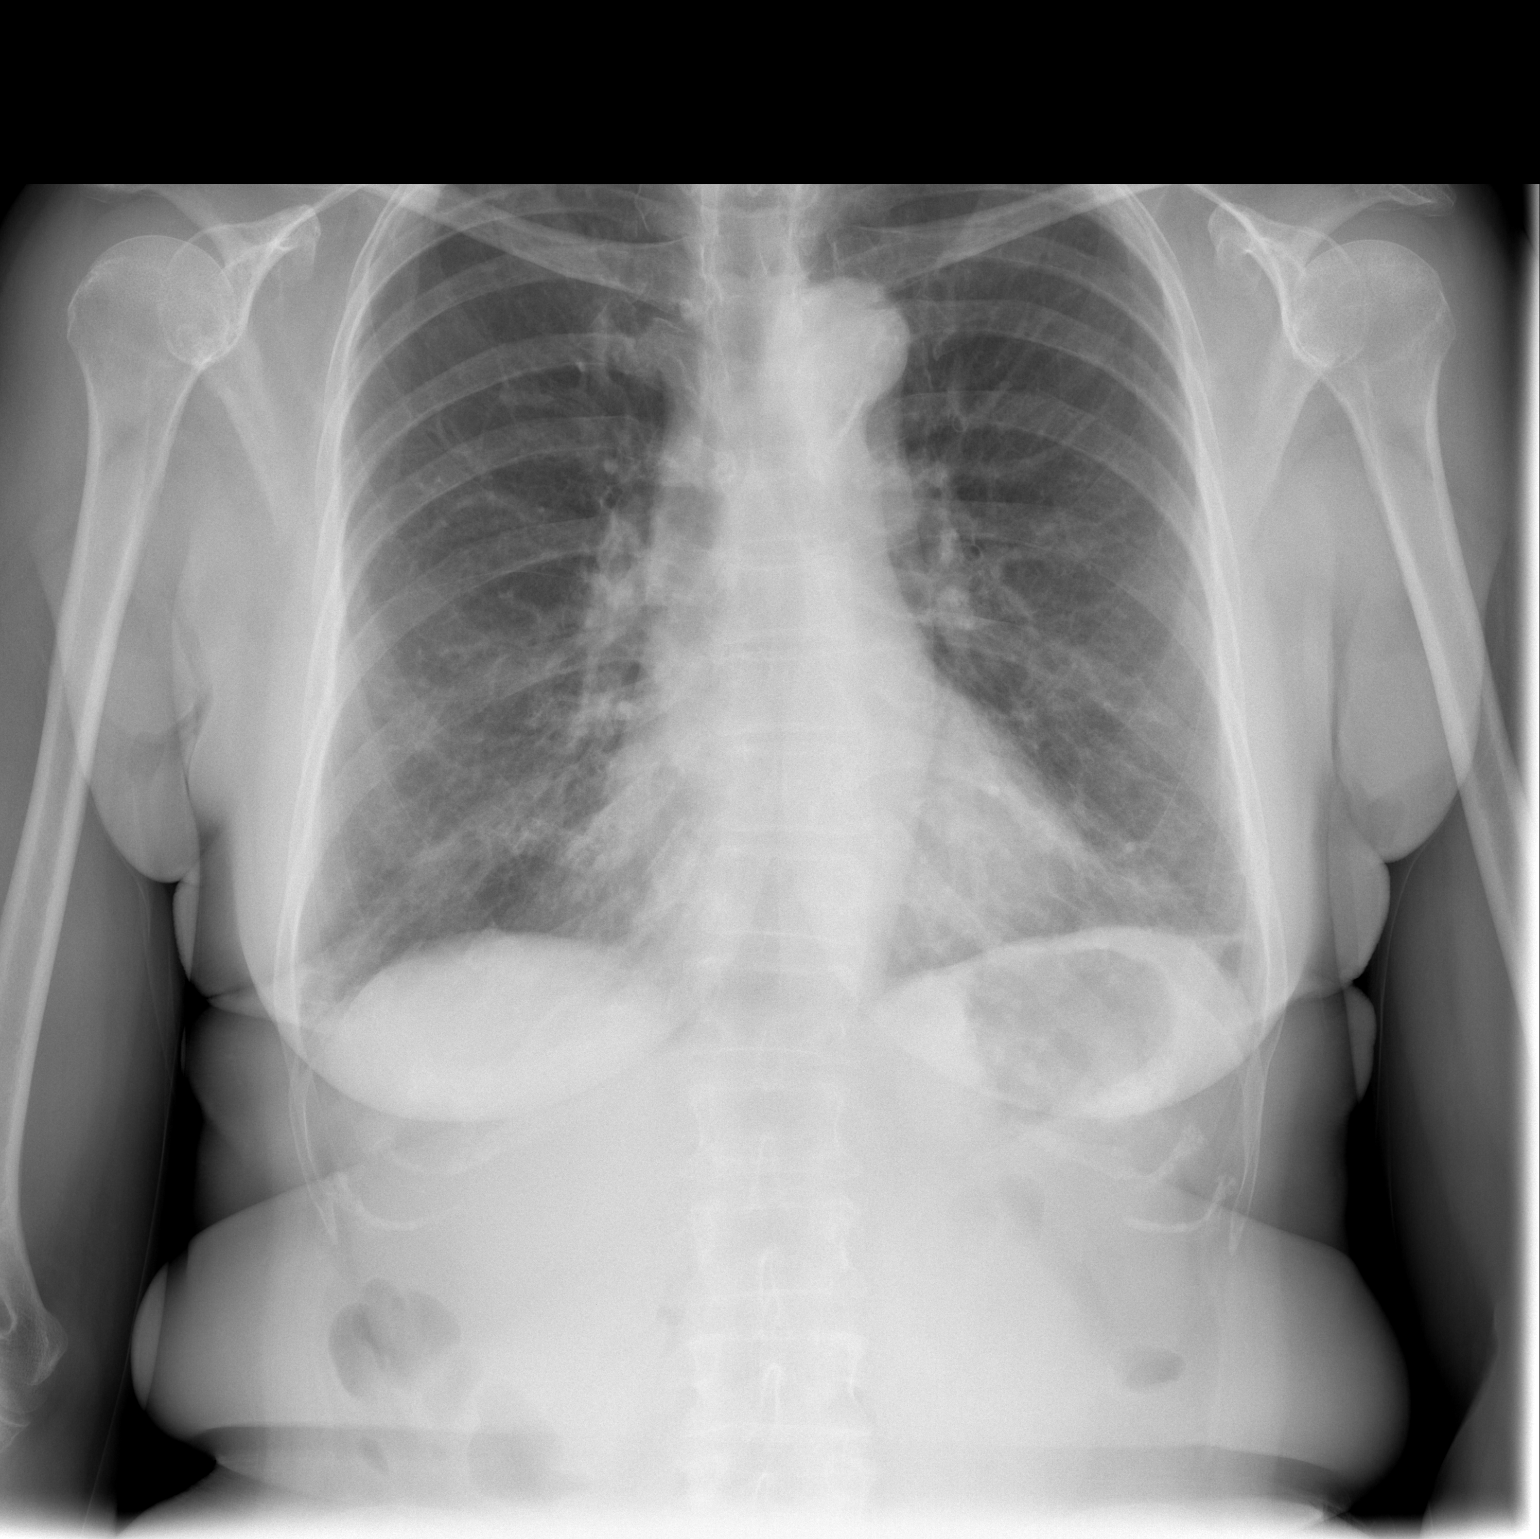

[w chest lat]
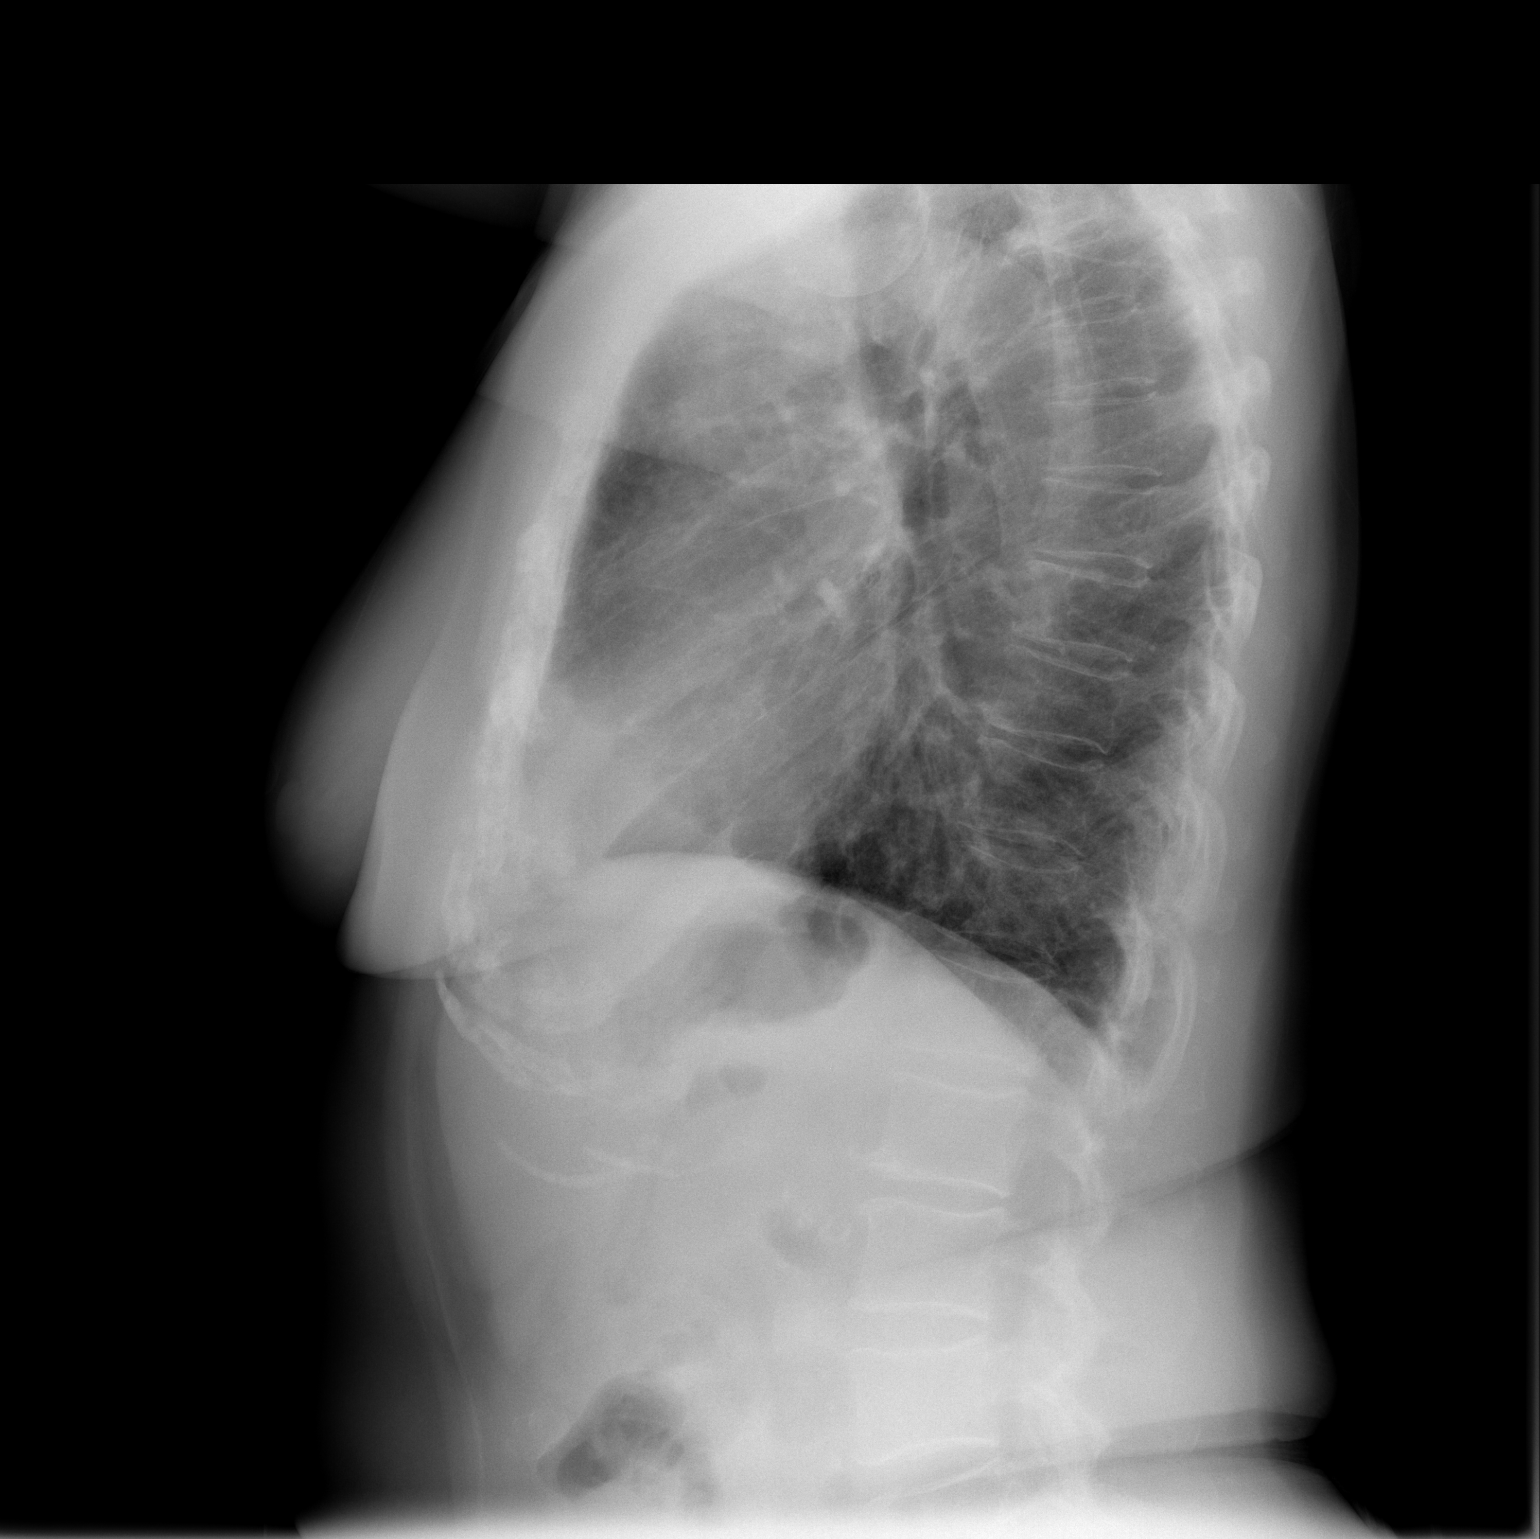

[2 of 2 positions shown; findings below may reference images not displayed]

FINDINGS: Mediastinum and hilar structures are normal. Basilar pleural
parenchymal thickening again noted consistent with scarring. No
acute infiltrate. No pleural effusion or pneumothorax. Heart size
stable. Pulmonary vascularity normal. Degenerative changes thoracic
spine.
IMPRESSION: No acute cardiopulmonary disease. Bilateral pleural parenchymal
scarring.

## 2016-09-26 ENCOUNTER — Ambulatory Visit (HOSPITAL_COMMUNITY)
Admission: RE | Admit: 2016-09-26 | Discharge: 2016-09-26 | Disposition: A | Discharge status: Home / Self Care | Payer: Medicare Other | Source: Ambulatory Visit | Attending: Cardiovascular Disease | Admitting: Cardiovascular Disease

## 2016-09-26 DIAGNOSIS — Z720 Tobacco use: Secondary | ICD-10-CM | POA: Insufficient documentation

## 2016-09-26 DIAGNOSIS — E785 Hyperlipidemia, unspecified: Secondary | ICD-10-CM | POA: Insufficient documentation

## 2016-09-26 DIAGNOSIS — I6523 Occlusion and stenosis of bilateral carotid arteries: Secondary | ICD-10-CM | POA: Diagnosis not present

## 2016-09-26 DIAGNOSIS — J449 Chronic obstructive pulmonary disease, unspecified: Secondary | ICD-10-CM | POA: Diagnosis not present

## 2016-09-30 ENCOUNTER — Other Ambulatory Visit: Payer: Self-pay | Admitting: Family

## 2016-10-04 ENCOUNTER — Other Ambulatory Visit: Payer: Self-pay | Admitting: Cardiology

## 2016-10-04 NOTE — Telephone Encounter (Signed)
atorvastatin (LIPITOR) 80 MG tablet  Medication  Date: 06/26/2016 Department: [facility] Office Ordering/Authorizing: Dorothy Spark, MD  Order Providers   Prescribing Provider Encounter Provider  Dorothy Spark, MD Dorothy Spark, MD  Medication Detail    Disp Refills Start End   atorvastatin (LIPITOR) 80 MG tablet 90 tablet 0 06/26/2016    Sig - Route: TAKE 0.5-1 TABLETS (40-80 MG TOTAL) BY MOUTH AS DIRECTED. - Oral   Notes to Pharmacy: Please call our office to schedule an yearly appointment for May before anymore refills. [phone]. Thank you 1st attempt   E-Prescribing Status: Receipt confirmed by pharmacy (06/26/2016 4:35 PM EST)   Pharmacy   [id number] - [place], [place]

## 2016-10-15 ENCOUNTER — Telehealth: Payer: Self-pay | Admitting: Family

## 2016-10-15 NOTE — Telephone Encounter (Signed)
I have not prescribed this medication for her and needs to follow up with Dr. [name] as she is overdue for an appointment. We can supplement an additional month to get her to that appointment if needed.

## 2016-10-15 NOTE — Telephone Encounter (Signed)
Pt called stating that when she was here to see Marya Amsler he told her to take 1/2 to 1 tablet of her Lipitor 80mg . She has been taking one every night. Is this okay? She will be needing [facility]ill [facility] [address]. She has 10 left.  I see where #15 was sent in by Ottie Glazier but I am not sure why and she did not know anything about it either. Please advise.

## 2016-10-16 NOTE — Telephone Encounter (Signed)
I tried to call the pt to let her know. She did not answer and her voicemail box was full.

## 2016-10-21 ENCOUNTER — Telehealth: Payer: Self-pay | Admitting: Cardiology

## 2016-10-21 MED ORDER — ATORVASTATIN CALCIUM 80 MG PO TABS: ORAL_TABLET | ORAL | 0 refills | Status: DC

## 2016-10-21 NOTE — Telephone Encounter (Signed)
New message      *STAT* If patient is at the pharmacy, call can be transferred to refill team.   1. Which medications need to be refilled? (please list name of each medication and dose if known)  atorvastatin (LIPITOR) 80 MG tablet TAKE 1/2 TO 1 TAB BY MOUTH AS DIRECTED. APPT NEEDED CALL [phone] TO SCHEDULE, 2ND ATTEMPT     2. Which pharmacy/location (including street and city if local pharmacy) is medication to be sent to? [facility] [address]. Do they need a 30 day or 90 day supply?  30  Has appt on July 17

## 2016-11-12 ENCOUNTER — Ambulatory Visit (INDEPENDENT_AMBULATORY_CARE_PROVIDER_SITE_OTHER): Payer: Medicare Other | Admitting: Physician Assistant

## 2016-11-12 ENCOUNTER — Encounter: Payer: Self-pay | Admitting: Physician Assistant

## 2016-11-12 ENCOUNTER — Encounter (INDEPENDENT_AMBULATORY_CARE_PROVIDER_SITE_OTHER): Payer: Self-pay

## 2016-11-12 VITALS — BP 110/60 | HR 72 | Ht 63.5 in | Wt 151.4 lb

## 2016-11-12 DIAGNOSIS — F17200 Nicotine dependence, unspecified, uncomplicated: Secondary | ICD-10-CM

## 2016-11-12 DIAGNOSIS — E782 Mixed hyperlipidemia: Secondary | ICD-10-CM | POA: Diagnosis not present

## 2016-11-12 DIAGNOSIS — Z8249 Family history of ischemic heart disease and other diseases of the circulatory system: Secondary | ICD-10-CM | POA: Diagnosis not present

## 2016-11-12 DIAGNOSIS — I779 Disorder of arteries and arterioles, unspecified: Secondary | ICD-10-CM

## 2016-11-12 DIAGNOSIS — R011 Cardiac murmur, unspecified: Secondary | ICD-10-CM

## 2016-11-12 DIAGNOSIS — I739 Peripheral vascular disease, unspecified: Secondary | ICD-10-CM

## 2016-11-12 MED ORDER — ATORVASTATIN CALCIUM 80 MG PO TABS: 80.0000 mg | ORAL_TABLET | Freq: Every day | ORAL | 3 refills | Status: DC

## 2016-11-12 NOTE — Progress Notes (Signed)
Cardiology Office Note    Date:  11/12/2016   ID:  Meagan Holmes, DOB 12/16/1940, MRN [id number]  PCP:  [place], FNP  Cardiologist: Dr. [name]  No chief complaint on file.   History of Present Illness:  Meagan Holmes is a 76 y.o. female with history of carotid stenosis 63-14% LICA, HLD, family history of CAD and tobacco abuse. Normal 2-D echo 06/2014 except for trivial MR.  Patient had repeat carotid Dopplers 09/26/16 and had regression of the LICA stenosis which is now 1-39%.  Patient presents today for yearly follow-up. I reviewed her carotid Dopplers with her. She also had lipid panel in April that looks good. She continues to smoke a pack of cigarettes daily. She says she can get rid of them because of stress. She had several deaths in her family over the past several years and adopted her 74-year-old great granddaughter and is raising her. She does exercises for about 10 minutes daily. She denies any chest pain, palpitations, dyspnea, dyspnea on exertion, dizziness or presyncope.    Past Medical History:  Diagnosis Date  . Allergic rhinitis    a lot better after septoplasty- remote  . Anxiety and depression 02/10/2007  . COPD (chronic obstructive pulmonary disease) ([place]) 10/02/2010  . Hematuria 2004   (-) CT abd-pelvis (-) cystoscopy  . Hypothyroidism   . Osteopenia     Past Surgical History:  Procedure Laterality Date  . APPENDECTOMY    . CARPAL TUNNEL RELEASE    . NASAL SEPTUM SURGERY    . THYROIDECTOMY      Current Medications: Current Meds  Medication Sig  . aspirin 81 MG tablet Take 81 mg by mouth daily.  . calcium gluconate 500 MG tablet Take 500 mg by mouth daily.    . celecoxib (CELEBREX) 100 MG capsule Take 1 capsule (100 mg total) by mouth 2 (two) times daily as needed.  . Cholecalciferol (VITAMIN D3 PO) Take 2,000 Units by mouth daily.   [name] escitalopram (LEXAPRO) 10 MG tablet TAKE 1 TABLET BY MOUTH EVERY DAY  . levothyroxine (SYNTHROID,  LEVOTHROID) 88 MCG tablet TAKE 1 TABLET (88 MCG TOTAL) BY MOUTH DAILY BEFORE BREAKFAST.  [name] risedronate (ACTONEL) 150 MG tablet TAKE 1 TABLET BY MOUTH MONTHLY WITH WATER ON EMPTY STOMACH, DO NOT LIE DOWN FOR 30 MIN  . [DISCONTINUED] atorvastatin (LIPITOR) 80 MG tablet TAKE 1/2 TO 1 TAB BY MOUTH AS DIRECTED. APPT NEEDED CALL [phone] TO SCHEDULE, 2ND ATTEMPT     Allergies:   Amoxil [amoxicillin]   Social History   Social History  . Marital status: Married    Spouse name: N/A  . Number of children: 3  . Years of education: 12   Occupational History  . retired    Social History Main Topics  . Smoking status: Current Every Day Smoker    Packs/day: 0.75    Years: 58.00    Types: Cigarettes    Start date: 04/29/2012  . Smokeless tobacco: Never Used     Comment: 1/2 ppd  . Alcohol use No  . Drug use: No  . Sexual activity: Not Asked   Other Topics Concern  . None   Social History Narrative   Lost son (to drugs) 2008     Husband ill, she takes care of him, raising her g-daughter    Born and raised in [place], [place]. Currently resides in a house with her husband and greatgrandaughter. Maltese dog. Fun: play games with family, gardening, gym  Denies religious beliefs that would effect health care.                  Family History:  The patient's family history includes Colon cancer in her other; Diabetes in her mother; Heart attack in her father; Hypertension in her mother.   ROS:   Please see the history of present illness.    Review of Systems  Constitution: Negative.  HENT: Negative.   Eyes: Negative.   Cardiovascular: Negative.   Respiratory: Negative.   Hematologic/Lymphatic: Negative.   Musculoskeletal: Negative.  Negative for joint pain.  Gastrointestinal: Negative.   Genitourinary: Negative.   Neurological: Negative.   Psychiatric/Behavioral: Positive for depression. The patient is nervous/anxious.    All other systems reviewed and are negative.   PHYSICAL  EXAM:   VS:  BP 110/60   Pulse 72   Ht 5' 3.5" (1.613 m)   Wt 151 lb 6.4 oz (68.7 kg)   SpO2 97%   BMI 26.40 kg/m   Physical Exam  GEN: Well nourished, well developed, in no acute distress  Neck: no JVD, carotid bruits, or masses Cardiac:RRR; no murmurs, rubs, or gallops  Respiratory:  clear to auscultation bilaterally, normal work of breathing GI: soft, nontender, nondistended, + BS Ext: without cyanosis, clubbing, or edema, Good distal pulses bilaterally Neuro:  Alert and Oriented x 3 Psych: euthymic mood, full affect  Wt Readings from Last 3 Encounters:  11/12/16 151 lb 6.4 oz (68.7 kg)  08/08/16 156 lb 6.4 oz (70.9 kg)  02/22/16 154 lb (69.9 kg)      Studies/Labs Reviewed:   EKG:  EKG is  ordered today.  The ekg ordered today demonstrates  Normal sinus rhythm, normal EKG  Recent Labs: 02/22/2016: TSH 0.95 08/08/2016: ALT 37; BUN 14; Creatinine, Ser 0.75; Hemoglobin 14.3; Platelets 234.0; Potassium 4.9; Sodium 140   Lipid Panel    Component Value Date/Time   CHOL 130 08/08/2016 1204   TRIG 79.0 08/08/2016 1204   TRIG 83 02/25/2006 1312   HDL 40.10 08/08/2016 1204   CHOLHDL 3 08/08/2016 1204   VLDL 15.8 08/08/2016 1204   LDLCALC 75 08/08/2016 1204   LDLDIRECT 171.2 03/19/2012 1129    Additional studies/ records that were reviewed today include:  Echo 3/2016Study Conclusions  - Left ventricle: The cavity size was normal. Systolic function was   normal. The estimated ejection fraction was in the range of 55%   to 60%. Wall motion was normal; there were no regional wall   motion abnormalities. - Atrial septum: No defect or patent foramen ovale was identified.  Carotid Dopplers 09/26/16 bilateral 1-39% ICA stenosis. Left ICA has regressed.   ASSESSMENT:    1. FH: CAD (coronary artery disease)   2. Carotid artery disease, unspecified laterality ([place])   3. Mixed hyperlipidemia   4. Heart murmur   5. TOBACCO ABUSE      PLAN:  In order of problems listed  above:  Family history of CAD EKG normal, patient without symptoms. Continue Lipitor 80 mg daily. Smoking cessation discussed. Increase exercise program to 150 minutes per week. F/U with Dr. [name] in 1 yr.  Carotid artery disease with Dopplers 09/26/16 shown regression of the left ICA now only 1-39% stenosis. Continue Lipitor.  Hyperlipidemia lipid panel from 07/2016 looks great. Continue Lipitor.  Heart murmur she had trivial MR on echo in 2016. Sounds more like an AAS murmur. Patient asymptomatic. Will not order echo this year. Consider next year.  Tobacco abuse smoking cessation  discussed. She also recently saw Eric Form, NP for smoking cessation but patient is not willing to quit at this time.   Medication Adjustments/Labs and Tests Ordered: Current medicines are reviewed at length with the patient today.  Concerns regarding medicines are outlined above.  Medication changes, Labs and Tests ordered today are listed in the Patient Instructions below. Patient Instructions  Medication Instructions:  1. A REFILL WAS SENT IN FOR THE LIPITOR   Labwork: NONE ORDERED TODAY  Testing/Procedures: NONE ORDERED TODAY  Follow-Up: Your physician wants you to follow-up in: [place] DR. Johann Capers will receive a reminder letter in the mail two months in advance. If you don't receive a letter, please call our office to schedule the follow-up appointment.   Any Other Special Instructions Will Be Listed Below (If Applicable). YOU HAVE BEEN ADVISED TO TRY AND QUIT SMOKING  YOU HAVE BEEN ADVISED TO TRY AND EXERCISE 150 MINUTES PER WEEK    If you need a refill on your cardiac medications before your next appointment, please call your pharmacy.      Signed, Ermalinda Barrios, PA-C  11/12/2016 2:15 PM    [facility] [address], [place], [place]  [address] Phone: [phone]; Fax: [phone]

## 2016-11-12 NOTE — Patient Instructions (Signed)
Medication Instructions:  1. A REFILL WAS SENT IN FOR THE LIPITOR   Labwork: NONE ORDERED TODAY  Testing/Procedures: NONE ORDERED TODAY  Follow-Up: Your physician wants you to follow-up in: [place] DR. Johann Capers will receive a reminder letter in the mail two months in advance. If you don't receive a letter, please call our office to schedule the follow-up appointment.   Any Other Special Instructions Will Be Listed Below (If Applicable). YOU HAVE BEEN ADVISED TO TRY AND QUIT SMOKING  YOU HAVE BEEN ADVISED TO TRY AND EXERCISE 150 MINUTES PER WEEK    If you need a refill on your cardiac medications before your next appointment, please call your pharmacy.

## 2016-11-28 ENCOUNTER — Telehealth: Payer: Self-pay | Admitting: Cardiology

## 2016-11-28 NOTE — Telephone Encounter (Signed)
Called pt to inform her that her pharmacy is getting her medication ready. Pt verbalized understanding.

## 2016-11-28 NOTE — Telephone Encounter (Signed)
Pt called to say she went to pick up Lipitor at [facility], and they tell her they do not have it-I explained it was e scribed on 7-17 for 90 day supply with 3 refills-she would like for [place] to call [facility] back and get this re-done-pt requested call back at [phone]

## 2016-12-01 ENCOUNTER — Other Ambulatory Visit: Payer: Self-pay | Admitting: Family

## 2016-12-17 NOTE — Telephone Encounter (Signed)
error 

## 2016-12-18 DIAGNOSIS — Z1231 Encounter for screening mammogram for malignant neoplasm of breast: Secondary | ICD-10-CM | POA: Diagnosis not present

## 2016-12-18 LAB — HM MAMMOGRAPHY

## 2016-12-24 ENCOUNTER — Encounter: Payer: Self-pay | Admitting: Family

## 2017-02-07 ENCOUNTER — Other Ambulatory Visit: Payer: Self-pay | Admitting: Family

## 2017-02-18 DIAGNOSIS — H02839 Dermatochalasis of unspecified eye, unspecified eyelid: Secondary | ICD-10-CM | POA: Diagnosis not present

## 2017-02-18 DIAGNOSIS — H2513 Age-related nuclear cataract, bilateral: Secondary | ICD-10-CM | POA: Diagnosis not present

## 2017-02-18 DIAGNOSIS — H40003 Preglaucoma, unspecified, bilateral: Secondary | ICD-10-CM | POA: Diagnosis not present

## 2017-02-18 DIAGNOSIS — H18413 Arcus senilis, bilateral: Secondary | ICD-10-CM | POA: Diagnosis not present

## 2017-03-06 ENCOUNTER — Other Ambulatory Visit: Payer: Self-pay | Admitting: *Deleted

## 2017-03-06 MED ORDER — LEVOTHYROXINE SODIUM 88 MCG PO TABS: ORAL_TABLET | ORAL | 0 refills | Status: DC

## 2017-04-03 ENCOUNTER — Other Ambulatory Visit: Payer: Self-pay | Admitting: Internal Medicine

## 2017-04-08 ENCOUNTER — Other Ambulatory Visit: Payer: Self-pay | Admitting: Internal Medicine

## 2017-04-09 NOTE — Telephone Encounter (Signed)
Pt does not have est care appt.

## 2017-04-12 ENCOUNTER — Other Ambulatory Visit: Payer: Self-pay | Admitting: Family

## 2017-04-14 ENCOUNTER — Telehealth: Payer: Self-pay | Admitting: Family

## 2017-04-14 NOTE — Telephone Encounter (Signed)
Pt requesting refill of Levothyroxine. Last OV with Donald Prose on 08/08/16. Next appt not until 06/19/17.

## 2017-04-14 NOTE — Telephone Encounter (Signed)
Copied from [place] [id number]. Topic: Quick Communication - See Telephone Encounter >> Apr 14, 2017 10:11 AM Boyd Kerbs wrote: CRM for notification. See Telephone encounter for:  Patient has appt to establish new patient Gayla Medicus) next appt 06/19/17,  she is needing a prescription for Levothyroxine 88mg  , is requesting refills until appt.   [id number] [address], [place] [address]  Phone: [phone] Fax: [phone]    04/14/17.

## 2017-04-15 MED ORDER — LEVOTHYROXINE SODIUM 88 MCG PO TABS: ORAL_TABLET | ORAL | 0 refills | Status: DC

## 2017-04-15 NOTE — Telephone Encounter (Signed)
LVM letting pt know she needs an appointment to get her thyroid checked. Has been over a year.

## 2017-04-15 NOTE — Telephone Encounter (Signed)
Pt has made acute appt for thyroid check on thurs 04/17/17. Would like to have enough called in to get her to the appt. [facility] [address] cb # is [phone]

## 2017-04-15 NOTE — Telephone Encounter (Signed)
Rx sent. Pt aware.  

## 2017-04-15 NOTE — Addendum Note (Signed)
Addended by: Delice Bison E on: 04/15/2017 04:39 PM   Modules accepted: Orders

## 2017-04-17 ENCOUNTER — Other Ambulatory Visit (INDEPENDENT_AMBULATORY_CARE_PROVIDER_SITE_OTHER): Payer: Medicare Other

## 2017-04-17 ENCOUNTER — Ambulatory Visit (INDEPENDENT_AMBULATORY_CARE_PROVIDER_SITE_OTHER): Payer: Medicare Other | Admitting: Family Medicine

## 2017-04-17 ENCOUNTER — Encounter: Payer: Self-pay | Admitting: Family Medicine

## 2017-04-17 VITALS — BP 132/66 | HR 62 | Temp 97.6°F | Ht 63.5 in | Wt 151.0 lb

## 2017-04-17 DIAGNOSIS — E039 Hypothyroidism, unspecified: Secondary | ICD-10-CM | POA: Diagnosis not present

## 2017-04-17 LAB — TSH: TSH: 0.88 u[IU]/mL (ref 0.35–4.50)

## 2017-04-17 NOTE — Progress Notes (Signed)
Meagan Holmes - 76 y.o. female MRN [id number]  Date of birth: Jul 06, 1940  SUBJECTIVE:  Including CC & ROS.  Chief Complaint  Patient presents with  . Thyroid check    Meagan Holmes is a 76 y.o. female that is here today for a Thyroid check. It has been more than a year since her last check. She is currently taking Levothyroxine daily. Patient reports no new changes. She is had surgery on her thyroid while in her 34's. 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 She was reported to have had decrease in her thyroid but she felt worse last year.  Review of her TSH from October 2017 shows it is 0.95   Review of Systems  Constitutional: Negative for fever.  Respiratory: Negative for cough and shortness of breath.   Cardiovascular: Negative for chest pain.  Musculoskeletal: Negative for gait problem.  Skin: Negative for color change.  Neurological: Negative for weakness.  Hematological: Negative for adenopathy.  Psychiatric/Behavioral: Negative for agitation.    HISTORY: Past Medical, Surgical, Social, and Family History Reviewed & Updated per EMR.   Pertinent Historical Findings include:  Past Medical History:  Diagnosis Date  . Allergic rhinitis    a lot better after septoplasty- remote  . Anxiety and depression 02/10/2007  . COPD (chronic obstructive pulmonary disease) ([place]) 10/02/2010  . Hematuria 2004   (-) CT abd-pelvis (-) cystoscopy  . Hypothyroidism   . Osteopenia     Past Surgical History:  Procedure Laterality Date  . APPENDECTOMY    . CARPAL TUNNEL RELEASE    . NASAL SEPTUM SURGERY    . THYROIDECTOMY      Allergies  Allergen Reactions  . Amoxil [Amoxicillin] Hives    Family History  Problem Relation Age of Onset  . Heart attack Father   . Diabetes Mother   . Hypertension Mother   . Colon cancer Other        uncle, age?  . Breast cancer Neg Hx   . Cancer Neg Hx   . Heart disease Neg Hx   . Stroke Neg Hx      Social History   Socioeconomic History  .  Marital status: Married    Spouse name: Not on file  . Number of children: 3  . Years of education: 25  . Highest education level: Not on file  Social Needs  . Financial resource strain: Not on file  . Food insecurity - worry: Not on file  . Food insecurity - inability: Not on file  . Transportation needs - medical: Not on file  . Transportation needs - non-medical: Not on file  Occupational History  . Occupation: retired  Tobacco Use  . Smoking status: Current Every Day Smoker    Packs/day: 0.75    Years: 58.00    Pack years: 43.50    Types: Cigarettes    Start date: 04/29/2012  . Smokeless tobacco: Never Used  . Tobacco comment: 1/2 ppd  Substance and Sexual Activity  . Alcohol use: No    Alcohol/week: 0.0 oz  . Drug use: No  . Sexual activity: Not on file  Other Topics Concern  . Not on file  Social History Narrative   Lost son (to drugs) 2008     Husband ill, she takes care of him, raising her g-daughter    Born and raised in [place], [place]. Currently resides in a house with her husband and greatgrandaughter. Maltese dog. Fun: play games with family, gardening, gym   Denies  religious beliefs that would effect health care.               PHYSICAL EXAM:  VS: BP 132/66 (BP Location: Left Arm, Patient Position: Sitting, Cuff Size: Normal)   Pulse 62   Temp 97.6 F (36.4 C) (Oral)   Ht 5' 3.5" (1.613 m)   Wt 151 lb (68.5 kg)   SpO2 97%   BMI 26.33 kg/m  Physical Exam Gen: NAD, alert, cooperative with exam, well-appearing ENT: normal lips, normal nasal mucosa,  Eye: normal EOM, normal conjunctiva and lids CV:  no edema, +2 pedal pulses, regular rate and rhythm   Resp: no accessory muscle use, non-labored, clear to auscultation bilaterally GI: no masses or tenderness, no hernia  Skin: no rashes, no areas of induration  Neuro: normal tone, normal sensation to touch Psych:  normal insight, alert and oriented MSK: Normal gait, normal strength     ASSESSMENT &  PLAN:   Hypothyroidism Needs refill of her medications and reports being asymptomatic - TSH today - TSH is unchanged for refilled Synthroid

## 2017-04-17 NOTE — Patient Instructions (Signed)
We will call you with the results from today.  Your cholesterol can be checked again in April.

## 2017-04-18 MED ORDER — LEVOTHYROXINE SODIUM 88 MCG PO TABS: ORAL_TABLET | ORAL | 1 refills | Status: DC

## 2017-04-18 NOTE — Assessment & Plan Note (Addendum)
Needs refill of her medications and reports being asymptomatic - TSH today - TSH is unchanged for refilled Synthroid

## 2017-05-03 ENCOUNTER — Encounter: Payer: Self-pay | Admitting: Family

## 2017-05-03 ENCOUNTER — Ambulatory Visit: Payer: Medicare Other | Admitting: Family

## 2017-05-03 VITALS — BP 122/66 | HR 72 | Temp 98.0°F | Ht 63.0 in | Wt 152.0 lb

## 2017-05-03 DIAGNOSIS — J209 Acute bronchitis, unspecified: Secondary | ICD-10-CM | POA: Diagnosis not present

## 2017-05-03 MED ORDER — AZITHROMYCIN 250 MG PO TABS: ORAL_TABLET | ORAL | 0 refills | Status: DC

## 2017-05-03 NOTE — Progress Notes (Signed)
Meagan Holmes is a 77 y.o. female with the following history as recorded in EpicCare:  Patient Active Problem List   Diagnosis Date Noted  . Medicare annual wellness visit, subsequent 08/08/2016  . Encounter for general adult medical examination with abnormal findings 08/08/2016  . Osteoarthritis 02/22/2016  . Triceps tendonitis 12/14/2015  . Systolic murmur 62/69/4854  . Smoking 1/2 pack a day or less 09/14/2015  . Acute upper respiratory infection 06/22/2015  . Wheezing 06/22/2015  . Right shoulder pain 04/06/2015  . Bilateral wrist pain 08/11/2014  . Heart murmur 07/06/2014  . Carotid artery disease ([place]) 05/21/2014  . Urinary frequency 05/13/2014  . Neck pain 12/19/2011  . COPD (chronic obstructive pulmonary disease) ([place]) 10/02/2010  . Osteoporosis 10/02/2010  . Annual physical exam 10/02/2010  . Hyperlipidemia 02/17/2009  . TOBACCO ABUSE 02/17/2009  . Hypothyroidism 02/10/2007  . Anxiety and depression 02/10/2007  . ALLERGIC RHINITIS 02/10/2007  . HEMATURIA, MICROSCOPIC, HX OF 02/10/2007    Current Outpatient Medications  Medication Sig Dispense Refill  . aspirin 81 MG tablet Take 81 mg by mouth daily.    [name] atorvastatin (LIPITOR) 80 MG tablet Take 1 tablet (80 mg total) by mouth daily. 90 tablet 3  . calcium gluconate 500 MG tablet Take 500 mg by mouth daily.      . Cholecalciferol (VITAMIN D3 PO) Take 2,000 Units by mouth daily.     [name] levothyroxine (SYNTHROID, LEVOTHROID) 88 MCG tablet TAKE 1 TABLET (88 MCG TOTAL) BY MOUTH DAILY BEFORE BREAKFAST. 90 tablet 1  . risedronate (ACTONEL) 150 MG tablet TAKE 1 TABLET BY MOUTH MONTHLY WITH WATER ON EMPTY STOMACH, DO NOT LIE DOWN FOR 30 MIN 3 tablet 1  . azithromycin (ZITHROMAX) 250 MG tablet 2 tabs po qd x 1 day; 1 tablet per day x 4 days; 6 tablet 0   No current facility-administered medications for this visit.     Allergies: Amoxil [amoxicillin]  Past Medical History:  Diagnosis Date  . Allergic rhinitis    a lot  better after septoplasty- remote  . Anxiety and depression 02/10/2007  . COPD (chronic obstructive pulmonary disease) ([place]) 10/02/2010  . Hematuria 2004   (-) CT abd-pelvis (-) cystoscopy  . Hypothyroidism   . Osteopenia     Past Surgical History:  Procedure Laterality Date  . APPENDECTOMY    . CARPAL TUNNEL RELEASE    . NASAL SEPTUM SURGERY    . THYROIDECTOMY      Family History  Problem Relation Age of Onset  . Heart attack Father   . Diabetes Mother   . Hypertension Mother   . Colon cancer Other        uncle, age?  . Breast cancer Neg Hx   . Cancer Neg Hx   . Heart disease Neg Hx   . Stroke Neg Hx     Social History   Tobacco Use  . Smoking status: Current Every Day Smoker    Packs/day: 0.75    Years: 58.00    Pack years: 43.50    Types: Cigarettes    Start date: 04/29/2012  . Smokeless tobacco: Never Used  . Tobacco comment: 1/2 ppd  Substance Use Topics  . Alcohol use: No    Alcohol/week: 0.0 oz    Subjective:  Patient complaining of cough/ cold symptoms; +productive cough; smokes 3/4 ppd- planning to quit; no fever; has used OTC Mucinex and Flonase; + wheezing; notes that cough is "deep."  Objective:  Vitals:   05/03/17 1042  BP: 122/66  Pulse: 72  Temp: 98 F (36.7 C)  TempSrc: Oral  SpO2: 94%  Weight: 152 lb (68.9 kg)  Height: 5\' 3"  (1.6 m)    General: Well developed, well nourished, in no acute distress  Skin : Warm and dry.  Head: Normocephalic and atraumatic  Eyes: Sclera and conjunctiva clear; pupils round and reactive to light; extraocular movements intact  Ears: External normal; canals clear; tympanic membranes normal  Oropharynx: Pink, supple. No suspicious lesions  Neck: Supple without thyromegaly, adenopathy  Lungs: Respirations unlabored; clear to auscultation bilaterally without wheeze, rales, rhonchi  CVS exam: normal rate and regular rhythm.  Neurologic: Alert and oriented; speech intact; face symmetrical; moves all extremities well;  CNII-XII intact without focal deficit   Assessment:  1. Acute bronchitis, unspecified organism     Plan:  Rx for Z-pak #1 take as directed; continue Flonase and Mucinex; encouraged to quit smoking; increase fluids, rest; follow up worse, no better.  Keep appointment with new PCP, [facility]nut [facility] for February 2019.   No Follow-up on file.  No orders of the defined types were placed in this encounter.   Requested Prescriptions   Signed Prescriptions Disp Refills  . azithromycin (ZITHROMAX) 250 MG tablet 6 tablet 0    Sig: 2 tabs po qd x 1 day; 1 tablet per day x 4 days;

## 2017-06-19 ENCOUNTER — Other Ambulatory Visit (INDEPENDENT_AMBULATORY_CARE_PROVIDER_SITE_OTHER): Payer: Medicare Other

## 2017-06-19 ENCOUNTER — Ambulatory Visit (INDEPENDENT_AMBULATORY_CARE_PROVIDER_SITE_OTHER): Payer: Medicare Other | Admitting: Nurse Practitioner

## 2017-06-19 ENCOUNTER — Encounter: Payer: Self-pay | Admitting: Nurse Practitioner

## 2017-06-19 VITALS — BP 110/62 | HR 69 | Temp 98.3°F | Resp 16 | Ht 63.0 in | Wt 155.4 lb

## 2017-06-19 DIAGNOSIS — E782 Mixed hyperlipidemia: Secondary | ICD-10-CM

## 2017-06-19 DIAGNOSIS — F17209 Nicotine dependence, unspecified, with unspecified nicotine-induced disorders: Secondary | ICD-10-CM

## 2017-06-19 DIAGNOSIS — J309 Allergic rhinitis, unspecified: Secondary | ICD-10-CM

## 2017-06-19 DIAGNOSIS — L989 Disorder of the skin and subcutaneous tissue, unspecified: Secondary | ICD-10-CM | POA: Diagnosis not present

## 2017-06-19 DIAGNOSIS — E039 Hypothyroidism, unspecified: Secondary | ICD-10-CM

## 2017-06-19 DIAGNOSIS — M810 Age-related osteoporosis without current pathological fracture: Secondary | ICD-10-CM

## 2017-06-19 HISTORY — DX: Nicotine dependence, unspecified, with unspecified nicotine-induced disorders: F17.209

## 2017-06-19 LAB — COMPREHENSIVE METABOLIC PANEL
ALK PHOS: 33 U/L — AB (ref 39–117)
ALT: 39 U/L — AB (ref 0–35)
AST: 35 U/L (ref 0–37)
Albumin: 4.2 g/dL (ref 3.5–5.2)
BILIRUBIN TOTAL: 0.4 mg/dL (ref 0.2–1.2)
BUN: 16 mg/dL (ref 6–23)
CO2: 32 mEq/L (ref 19–32)
Calcium: 10.1 mg/dL (ref 8.4–10.5)
Chloride: 103 mEq/L (ref 96–112)
Creatinine, Ser: 0.85 mg/dL (ref 0.40–1.20)
GFR: 68.93 mL/min (ref >60.00)
GLUCOSE: 119 mg/dL — AB (ref 70–99)
Potassium: 4.3 mEq/L (ref 3.5–5.1)
Sodium: 141 mEq/L (ref 135–145)
TOTAL PROTEIN: 7.7 g/dL (ref 6.0–8.3)

## 2017-06-19 MED ORDER — LEVOCETIRIZINE DIHYDROCHLORIDE 5 MG PO TABS: 5.0000 mg | ORAL_TABLET | Freq: Every evening | ORAL | 1 refills | Status: DC

## 2017-06-19 MED ORDER — MOMETASONE FUROATE 50 MCG/ACT NA SUSP: 2.0000 | Freq: Every day | NASAL | 12 refills | Status: DC

## 2017-06-19 NOTE — Assessment & Plan Note (Signed)
Stable, continue current medications.  

## 2017-06-19 NOTE — Assessment & Plan Note (Signed)
Stable, continue statin, aspirin and healthy diet Encouraged to stop smoking today - Comprehensive metabolic panel; Future

## 2017-06-19 NOTE — Assessment & Plan Note (Signed)
Nicotine dependence Asked about tobacco use-not ready to quit, but she is thinking about it She was advised to quit. Assess willingness to make an attempt: she does not want to quit today She has tried many things in the past- patch, vape- and always returns to smoking. She feels it helps her cope with daily stress Assist in quit attempt: We talked about additional options including chantix- she will think about it. Arrange follow up: RTC in 6 months for routine follow up, or sooner if needed. Encouraged her to reach out at any point that she decided to quit smoking. 3-10 minutes were spent counseling about smoking cessation

## 2017-06-19 NOTE — Patient Instructions (Addendum)
Please head downstairs for lab work.  Stop your actonel. We will recheck your bone density test next year. Please continue your calcium and vitamin D supplements.  I have placed a referral to dermatology for your skin. Our office will call you to schedule this appointment. You should hear from our office in 7-10 days.  I have sent a new prescription for nasonex 2 sprays in the nose daily and xyzal 5mg  every evening for your allergies. Xyzal may cause drowsiness. You may find that your allergies are relieved with just the nasal spray, but you can use both the nasal spray and the xyzal if needed. Please follow up if no improvement.  Please return in about 6 months for follow up, or sooner if you need.  If you have medicare related insurance (such as traditional Medicare, [employer], [employer], or similar), Please make an appointment at the scheduling desk with Sharee Pimple, the [employer], for your Wellness visit in this office, which is a benefit with your insurance.  It was nice to meet you. Thanks for letting me take care of you today :)  Allergic Rhinitis, Adult Allergic rhinitis is an allergic reaction that affects the mucous membrane inside the nose. It causes sneezing, a runny or stuffy nose, and the feeling of mucus going down the back of the throat (postnasal drip). Allergic rhinitis can be mild to severe. There are two types of allergic rhinitis:  Seasonal. This type is also called hay fever. It happens only during certain seasons.  Perennial. This type can happen at any time of the year.  What are the causes? This condition happens when the body's defense system (immune system) responds to certain harmless substances called allergens as though they were germs.  Seasonal allergic rhinitis is triggered by pollen, which can come from grasses, trees, and weeds. Perennial allergic rhinitis may be caused by:  House dust mites.  Pet dander.  Mold  spores.  What are the signs or symptoms? Symptoms of this condition include:  Sneezing.  Runny or stuffy nose (nasal congestion).  Postnasal drip.  Itchy nose.  Tearing of the eyes.  Trouble sleeping.  Daytime sleepiness.  How is this diagnosed? This condition may be diagnosed based on:  Your medical history.  A physical exam.  Tests to check for related conditions, such as: ? Asthma. ? Pink eye. ? Ear infection. ? Upper respiratory infection.  Tests to find out which allergens trigger your symptoms. These may include skin or blood tests.  How is this treated? There is no cure for this condition, but treatment can help control symptoms. Treatment may include:  Taking medicines that block allergy symptoms, such as antihistamines. Medicine may be given as a shot, nasal spray, or pill.  Avoiding the allergen.  Desensitization. This treatment involves getting ongoing shots until your body becomes less sensitive to the allergen. This treatment may be done if other treatments do not help.  If taking medicine and avoiding the allergen does not work, new, stronger medicines may be prescribed.  Follow these instructions at home:  Find out what you are allergic to. Common allergens include smoke, dust, and pollen.  Avoid the things you are allergic to. These are some things you can do to help avoid allergens: ? Replace carpet with wood, tile, or vinyl flooring. Carpet can trap dander and dust. ? Do not smoke. Do not allow smoking in your home. ? Change your heating and air conditioning filter at least once  a month. ? During allergy season:  Keep windows closed as much as possible.  Plan outdoor activities when pollen counts are lowest. This is usually during the evening hours.  When coming indoors, change clothing and shower before sitting on furniture or bedding.  Take over-the-counter and prescription medicines only as told by your health care provider.  Keep  all follow-up visits as told by your health care provider. This is important. Contact a health care provider if:  You have a fever.  You develop a persistent cough.  You make whistling sounds when you breathe (you wheeze).  Your symptoms interfere with your normal daily activities. Get help right away if:  You have shortness of breath. Summary  This condition can be managed by taking medicines as directed and avoiding allergens.  Contact your health care provider if you develop a persistent cough or fever.  During allergy season, keep windows closed as much as possible. This information is not intended to replace advice given to you by your health care provider. Make sure you discuss any questions you have with your health care provider. Document Released: 01/08/2001 Document Revised: 05/23/2016 Document Reviewed: 05/23/2016 Elsevier Interactive Patient Education  [employer].

## 2017-06-19 NOTE — Assessment & Plan Note (Signed)
Symptoms are consistent with allergic rhinitis No relief with claritin, flonase Will try switching medications Instructions to follow up if no improvement or worsening symptoms-See AVS for education provided to patient - mometasone (NASONEX) 50 MCG/ACT nasal spray; Place 2 sprays into the nose daily.  Dispense: 17 g; Refill: 12 - levocetirizine (XYZAL) 5 MG tablet; Take 1 tablet (5 mg total) by mouth every evening.  Dispense: 30 tablet; Refill: 1

## 2017-06-19 NOTE — Progress Notes (Signed)
Name: Meagan Holmes   MRN: [id number]    DOB: 1940/06/27   Date:06/19/2017       Progress Note  Subjective  Chief Complaint  Chief Complaint  Patient presents with  . Establish Care    x1 month has had sinus issues, states that her nose has been staying stopped up since she had bronchitis, place on back she would like checked    HPI Ms Meagan Holmes is establishing care with me today. She is maintained on daily medications for osteoporosis, hypothyroidism, hyperlipidemia.  She would like to discuss sinus congestion and a skin problem.  Sinus congestion- Diagnosed with bronchitis and Treated with zpak on 1/5, instructed to continue flonase and mucinex. She has also been taking claritin. She says her cough cleared after the bronchitis but she continues to have daily allergy symptoms- nasal congestion, clear nasal drainage, watery eyes, sneezing. She denies fevers, headaches, vision changes, ear pain, sore throat, chest pain or shortness of breath. H/o allergic rhinitis. She says that she had a surgery for Deviated setpum years ago, and all of her allergy symptoms seemed to stop after the surgery. However this year she has seemed to notice return of allergies.  Skin problem- She has noticed some raised spots on her back that she can not see, she can feel them with her back scratcher. She denies any pain or bleeding. Frequent exposure to sun without sunscreen in past  Hypothyroidism-maintained on synthroid She takes her synthroid daily without noted adverse effects. She denies fatigue, weight gain, decreased appetite, intolerance to hot or cold, palpitations, constipation, dry skin.  Lab Results  Component Value Date   TSH 0.88 04/17/2017   Osteoporosis- maintained on actonel, which she says she has been taking for many years- denies any adverse effects including GI upset Also takes calcium and vitamin d3 OTC daily Last bone density 2017.  Cholesterol- maintained on atorvastatin 80. Also  taking asa 81 daily She reports daily medication compliance without adverse effects including myalgias. She also tries to follow a healthy diet without fried foods She is a smoker, and she does not feel ready to quit.  Lab Results  Component Value Date   CHOL 130 08/08/2016   HDL 40.10 08/08/2016   LDLCALC 75 08/08/2016   LDLDIRECT 171.2 03/19/2012   TRIG 79.0 08/08/2016   CHOLHDL 3 08/08/2016    Patient Active Problem List   Diagnosis Date Noted  . Medicare annual wellness visit, subsequent 08/08/2016  . Encounter for general adult medical examination with abnormal findings 08/08/2016  . Osteoarthritis 02/22/2016  . Triceps tendonitis 12/14/2015  . Systolic murmur 42/35/3614  . Smoking 1/2 pack a day or less 09/14/2015  . Acute upper respiratory infection 06/22/2015  . Wheezing 06/22/2015  . Right shoulder pain 04/06/2015  . Bilateral wrist pain 08/11/2014  . Heart murmur 07/06/2014  . Carotid artery disease ([place]) 05/21/2014  . Urinary frequency 05/13/2014  . Neck pain 12/19/2011  . COPD (chronic obstructive pulmonary disease) ([place]) 10/02/2010  . Osteoporosis 10/02/2010  . Annual physical exam 10/02/2010  . Hyperlipidemia 02/17/2009  . TOBACCO ABUSE 02/17/2009  . Hypothyroidism 02/10/2007  . Anxiety and depression 02/10/2007  . ALLERGIC RHINITIS 02/10/2007  . HEMATURIA, MICROSCOPIC, HX OF 02/10/2007    Past Surgical History:  Procedure Laterality Date  . APPENDECTOMY    . CARPAL TUNNEL RELEASE    . NASAL SEPTUM SURGERY    . THYROIDECTOMY      Family History  Problem Relation Age of Onset  .  Heart attack Father   . Diabetes Mother   . Hypertension Mother   . Colon cancer Other        uncle, age?  . Breast cancer Neg Hx   . Cancer Neg Hx   . Heart disease Neg Hx   . Stroke Neg Hx     Social History   Socioeconomic History  . Marital status: Married    Spouse name: Not on file  . Number of children: 3  . Years of education: 29  . Highest education  level: Not on file  Social Needs  . Financial resource strain: Not on file  . Food insecurity - worry: Not on file  . Food insecurity - inability: Not on file  . Transportation needs - medical: Not on file  . Transportation needs - non-medical: Not on file  Occupational History  . Occupation: retired  Tobacco Use  . Smoking status: Current Every Day Smoker    Packs/day: 0.75    Years: 58.00    Pack years: 43.50    Types: Cigarettes    Start date: 04/29/2012  . Smokeless tobacco: Never Used  . Tobacco comment: 1/2 ppd  Substance and Sexual Activity  . Alcohol use: No    Alcohol/week: 0.0 oz  . Drug use: No  . Sexual activity: Not on file  Other Topics Concern  . Not on file  Social History Narrative   Lost son (to drugs) 2008     Husband ill, she takes care of him, raising her g-daughter    Born and raised in [place], [place]. Currently resides in a house with her husband and greatgrandaughter. Maltese dog. Fun: play games with family, gardening, gym   Denies religious beliefs that would effect health care.               Current Outpatient Medications:  .  aspirin 81 MG tablet, Take 81 mg by mouth daily., Disp: , Rfl:  .  atorvastatin (LIPITOR) 80 MG tablet, Take 1 tablet (80 mg total) by mouth daily., Disp: 90 tablet, Rfl: 3 .  calcium gluconate 500 MG tablet, Take 500 mg by mouth daily.  , Disp: , Rfl:  .  Cholecalciferol (VITAMIN D3 PO), Take 2,000 Units by mouth daily. , Disp: , Rfl:  .  levothyroxine (SYNTHROID, LEVOTHROID) 88 MCG tablet, TAKE 1 TABLET (88 MCG TOTAL) BY MOUTH DAILY BEFORE BREAKFAST., Disp: 90 tablet, Rfl: 1 .  risedronate (ACTONEL) 150 MG tablet, TAKE 1 TABLET BY MOUTH MONTHLY WITH WATER ON EMPTY STOMACH, DO NOT LIE DOWN FOR 30 MIN, Disp: 3 tablet, Rfl: 1  Allergies  Allergen Reactions  . Amoxil [Amoxicillin] Hives     ROS See HPI  Objective  Vitals:   06/19/17 1449  BP: 110/62  Pulse: 69  Resp: 16  Temp: 98.3 F (36.8 C)  TempSrc: Oral   SpO2: 97%  Weight: 155 lb 6.4 oz (70.5 kg)  Height: 5\' 3"  (1.6 m)    Body mass index is 27.53 kg/m.  Physical Exam Vital signs reviewed. Constitutional: Patient appears well-developed and well-nourished. No distress.  HENT: Head: Normocephalic and atraumatic. Ears: Bilateral TMs without erythema or effusion; scant cerumen. Nose: Nose normal. Mouth/Throat: Oropharynx is clear and moist. No oropharyngeal exudate.  Eyes: Conjunctivae and EOM are normal. Pupils are equal, round, and reactive to light. No scleral icterus.  Neck: Normal range of motion. Neck supple. No cervical adenopathy. Cardiovascular: Normal rate, regular rhythm. Distal pulses intact. Pulmonary/Chest: Effort normal and breath sounds  normal. No respiratory distress. Abdominal: Soft, no distension Musculoskeletal: Normal range of motion, no joint effusions. No gross deformities Neurological: She is alert and oriented to person, place, and time.  Coordination, balance, strength, speech and gait are normal.  Skin: Skin is warm and dry. No rash noted. No erythema. One raised light brown scaly lesion to central back without redness, smaller than a pencil eraser Psychiatric: Patient has a normal mood and affect. behavior is normal. Judgment and thought content normal.   Recent Results (from the past 2160 hour(s))  TSH     Status: None   Collection Time: 04/17/17  1:42 PM  Result Value Ref Range   TSH 0.88 0.35 - 4.50 uIU/mL     Assessment & Plan RTC in 6 months for routine follow up Recommended wellness with Sharee Pimple  Skin problem ?Seborrheic keratosis We discussed follow up with derm for annual skin check due to her concern for moles and history of sun exposure, she would like referral - Ambulatory referral to Dermatology

## 2017-06-19 NOTE — Assessment & Plan Note (Addendum)
She has been maintained on actonel for at least 5 years- 2014- maybe longer on chart review We discussed considering a drug holiday from actonel due to the length of time she has been on the medication. We discussed complete drug holiday vs starting prolia- she would like to stop the actonel and follow up with DEXA in 1 year. Will consider restarting actonel vs other pharm options if DEXA scan shows worsening osteoporosis in 1 year. She will continue vitamin D and calcium supplements

## 2017-06-20 ENCOUNTER — Other Ambulatory Visit: Payer: Self-pay | Admitting: Nurse Practitioner

## 2017-06-20 DIAGNOSIS — R899 Unspecified abnormal finding in specimens from other organs, systems and tissues: Secondary | ICD-10-CM

## 2017-06-25 ENCOUNTER — Other Ambulatory Visit (INDEPENDENT_AMBULATORY_CARE_PROVIDER_SITE_OTHER): Payer: Medicare Other

## 2017-06-25 DIAGNOSIS — R899 Unspecified abnormal finding in specimens from other organs, systems and tissues: Secondary | ICD-10-CM

## 2017-06-25 LAB — GLUCOSE, RANDOM: GLUCOSE: 103 mg/dL — AB (ref 70–99)

## 2017-06-25 LAB — HEMOGLOBIN A1C: HEMOGLOBIN A1C: 6.1 % (ref 4.6–6.5)

## 2017-07-03 DIAGNOSIS — L821 Other seborrheic keratosis: Secondary | ICD-10-CM | POA: Diagnosis not present

## 2017-07-03 DIAGNOSIS — D225 Melanocytic nevi of trunk: Secondary | ICD-10-CM | POA: Diagnosis not present

## 2017-08-17 ENCOUNTER — Other Ambulatory Visit: Payer: Self-pay | Admitting: Nurse Practitioner

## 2017-08-17 DIAGNOSIS — J309 Allergic rhinitis, unspecified: Secondary | ICD-10-CM

## 2017-08-20 ENCOUNTER — Other Ambulatory Visit: Payer: Self-pay | Admitting: Family

## 2017-08-27 ENCOUNTER — Telehealth: Payer: Self-pay | Admitting: Nurse Practitioner

## 2017-08-27 NOTE — Telephone Encounter (Signed)
Spoke with Meagan Holmes regarding AWV. Patient stated that she would like office to give her a call back in July 2019 to schedule a wellness appointment. SF

## 2017-08-29 ENCOUNTER — Ambulatory Visit (INDEPENDENT_AMBULATORY_CARE_PROVIDER_SITE_OTHER)
Admission: RE | Admit: 2017-08-29 | Discharge: 2017-08-29 | Disposition: A | Discharge status: Home / Self Care | Payer: Medicare Other | Source: Ambulatory Visit | Attending: Acute Care | Admitting: Acute Care

## 2017-08-29 DIAGNOSIS — F17210 Nicotine dependence, cigarettes, uncomplicated: Secondary | ICD-10-CM

## 2017-09-02 DIAGNOSIS — Z8249 Family history of ischemic heart disease and other diseases of the circulatory system: Secondary | ICD-10-CM | POA: Insufficient documentation

## 2017-09-02 HISTORY — DX: Family history of ischemic heart disease and other diseases of the circulatory system: Z82.49

## 2017-09-02 NOTE — Progress Notes (Signed)
Cardiology Office Note    Date:  09/03/2017   ID:  Meagan Holmes, DOB 02-01-1941, MRN [id number]  PCP:  Meagan Spark, MD  Cardiologist: Meagan Dawley, MD  Chief Complaint  Patient presents with  . Follow-up    History of Present Illness:  Meagan Holmes is a 77 y.o. female with family history of CAD,HLD, tobacco abuse and carotid stenosis with 78-93% LICA in the past that showed regression to 1-39% 08/2016 dopplers. Normal echo 2016.  I saw the patient 10/2016 at which time she was doing well but was still smoking and had multiple family stressors.She did have a heart murmur but was asymptomatic.  Patient comes in today for yearly f/u. Denies chest pain, palpitations, dyspnea, dyspnea on exertion or dizziness. Smoking 1/2-3/4 ppd. Has tried to quit but helps with stress.Hallucinations on nicotine patch. Does stretching exercises at times but no regular exercise.     Past Medical History:  Diagnosis Date  . Allergic rhinitis    a lot better after septoplasty- remote  . Anxiety and depression 02/10/2007  . COPD (chronic obstructive pulmonary disease) ([place]) 10/02/2010  . Hematuria 2004   (-) CT abd-pelvis (-) cystoscopy  . Hypothyroidism   . Osteopenia     Past Surgical History:  Procedure Laterality Date  . APPENDECTOMY    . CARPAL TUNNEL RELEASE    . NASAL SEPTUM SURGERY    . THYROIDECTOMY      Current Medications: Current Meds  Medication Sig  . aspirin 81 MG tablet Take 81 mg by mouth daily.  [name] atorvastatin (LIPITOR) 80 MG tablet Take 1 tablet (80 mg total) by mouth daily.  . calcium gluconate 500 MG tablet Take 500 mg by mouth daily.    . Cholecalciferol (VITAMIN D3 PO) Take 2,000 Units by mouth daily.   [name] levocetirizine (XYZAL) 5 MG tablet TAKE 1 TABLET BY MOUTH EVERY DAY IN THE EVENING  . levothyroxine (SYNTHROID, LEVOTHROID) 88 MCG tablet TAKE 1 TABLET (88 MCG TOTAL) BY MOUTH DAILY BEFORE BREAKFAST.  . mometasone (NASONEX) 50 MCG/ACT nasal spray Place  2 sprays into the nose daily.     Allergies:   Amoxil [amoxicillin]   Social History   Socioeconomic History  . Marital status: Married    Spouse name: Not on file  . Number of children: 3  . Years of education: 35  . Highest education level: Not on file  Occupational History  . Occupation: retired  Scientific laboratory technician  . Financial resource strain: Not on file  . Food insecurity:    Worry: Not on file    Inability: Not on file  . Transportation needs:    Medical: Not on file    Non-medical: Not on file  Tobacco Use  . Smoking status: Current Every Day Smoker    Packs/day: 0.75    Years: 58.00    Pack years: 43.50    Types: Cigarettes    Start date: 04/29/2012  . Smokeless tobacco: Never Used  . Tobacco comment: 1/2 ppd  Substance and Sexual Activity  . Alcohol use: No    Alcohol/week: 0.0 oz  . Drug use: No  . Sexual activity: Not on file  Lifestyle  . Physical activity:    Days per week: Not on file    Minutes per session: Not on file  . Stress: Not on file  Relationships  . Social connections:    Talks on phone: Not on file    Gets together: Not on file  Attends religious service: Not on file    Active member of club or organization: Not on file    Attends meetings of clubs or organizations: Not on file    Relationship status: Not on file  Other Topics Concern  . Not on file  Social History Narrative   Lost son (to drugs) 2008     Husband ill, she takes care of him, raising her g-daughter    Born and raised in [place], [place]. Currently resides in a house with her husband and greatgrandaughter. Maltese dog. Fun: play games with family, gardening, gym   Denies religious beliefs that would effect health care.               Family History:  The patient's family history includes Colon cancer in her other; Diabetes in her mother; Heart attack in her father; Hypertension in her mother.   ROS:   Please see the history of present illness.    Review of Systems    Constitution: Negative.  HENT: Negative.   Eyes: Negative.   Cardiovascular: Negative.   Respiratory: Negative.   Hematologic/Lymphatic: Negative.   Musculoskeletal: Negative.  Negative for joint pain.  Gastrointestinal: Negative.   Genitourinary: Negative.   Neurological: Negative.   Psychiatric/Behavioral: The patient is nervous/anxious.    All other systems reviewed and are negative.   PHYSICAL EXAM:   VS:  BP 124/76   Pulse 67   Ht 5\' 3"  (1.6 m)   Wt 156 lb (70.8 kg)   SpO2 95%   BMI 27.63 kg/m   Physical Exam  GEN: Well nourished, well developed, in no acute distress  Neck: Bilateral carotid bruits no JVD, or masses Cardiac:RRR; 2/6 harsh systolic murmur at the right sternal border Respiratory:  clear to auscultation bilaterally, normal work of breathing GI: soft, nontender, nondistended, + BS Ext: without cyanosis, clubbing, or edema, Good distal pulses bilaterally Neuro:  Alert and Oriented x 3 Psych: euthymic mood, full affect  Wt Readings from Last 3 Encounters:  09/03/17 156 lb (70.8 kg)  06/19/17 155 lb 6.4 oz (70.5 kg)  05/03/17 152 lb (68.9 kg)      Studies/Labs Reviewed:   EKG:  EKG is ordered today.  The ekg ordered today demonstrates normal sinus rhythm, normal EKG  Recent Labs: 04/17/2017: TSH 0.88 06/19/2017: ALT 39; BUN 16; Creatinine, Ser 0.85; Potassium 4.3; Sodium 141   Lipid Panel    Component Value Date/Time   CHOL 130 08/08/2016 1204   TRIG 79.0 08/08/2016 1204   TRIG 83 02/25/2006 1312   HDL 40.10 08/08/2016 1204   CHOLHDL 3 08/08/2016 1204   VLDL 15.8 08/08/2016 1204   LDLCALC 75 08/08/2016 1204   LDLDIRECT 171.2 03/19/2012 1129    Additional studies/ records that were reviewed today include:   Echo 3/2016Study Conclusions  - Left ventricle: The cavity size was normal. Systolic function was   normal. The estimated ejection fraction was in the range of 55%   to 60%. Wall motion was normal; there were no regional wall    motion abnormalities. - Atrial septum: No defect or patent foramen ovale was identified.   Carotid Dopplers 09/26/16 bilateral 1-39% ICA stenosis. Left ICA has regressed.        ASSESSMENT:    1. Stenosis of left carotid artery   2. Family history of early CAD   3. Nicotine dependence with nicotine-induced disorder, unspecified nicotine product type   4. Mixed hyperlipidemia   5. Heart murmur  PLAN:  In order of problems listed above:  LICA stenosis showed regression on dopplers 08/2016 for repeat Dopplers next month  Family hx of CAD-risk factor modification recommended-smoking cessation-patient says she is going to try the patches again and take them off at night to avoid hallucinations.  Nicotine dependence-has seen Eric Form NP for this  Hyperlipidemia on Lipitor.  Check fasting lipid panel when she comes in for her echo.  Heart murmur murmur is consistent with aortic stenosis-check echo.follow-up with Dr. [name] in 1 year.    Medication Adjustments/Labs and Tests Ordered: Current medicines are reviewed at length with the patient today.  Concerns regarding medicines are outlined above.  Medication changes, Labs and Tests ordered today are listed in the Patient Instructions below. Patient Instructions  Medication Instructions:  Your physician recommends that you continue on your current medications as directed. Please refer to the Current Medication list given to you today.  Labwork: Your physician recommends that you return for lab work for fasting lipid profile and LFTs.  You will need to be fasting for this blood work.   Testing/Procedures: Your physician has requested that you have an echocardiogram. Echocardiography is a painless test that uses sound waves to create images of your heart. It provides your doctor with information about the size and shape of your heart and how well your heart's chambers and valves are working. This procedure takes approximately one  hour. There are no restrictions for this procedure.  Follow-Up: Your physician wants you to follow-up in: 1 year with Dr. [name].  You will receive a reminder letter in the mail two months in advance. If you don't receive a letter, please call our office to schedule the follow-up appointment.  * If you need a refill on your cardiac medications before your next appointment, please call your pharmacy.   *Please note that any paperwork needing to be filled out by the provider will need to be addressed at the front desk prior to seeing the provider. Please note that any FMLA, disability or other documents regarding health condition is subject to a $25.00 charge that must be received prior to completion of paperwork in the form of a money order or check.  Thank you for choosing [facility]!!    Any Other Special Instructions Will Be Listed Below (If Applicable).  Stop smoking         Signed, Ermalinda Barrios, PA-C  09/03/2017 10:55 AM    [facility] [address], [place], [place]  [address] Phone: [phone]; Fax: [phone]

## 2017-09-03 ENCOUNTER — Encounter: Payer: Self-pay | Admitting: Physician Assistant

## 2017-09-03 ENCOUNTER — Ambulatory Visit: Payer: Medicare Other | Admitting: Physician Assistant

## 2017-09-03 VITALS — BP 124/76 | HR 67 | Ht 63.0 in | Wt 156.0 lb

## 2017-09-03 DIAGNOSIS — F17209 Nicotine dependence, unspecified, with unspecified nicotine-induced disorders: Secondary | ICD-10-CM | POA: Diagnosis not present

## 2017-09-03 DIAGNOSIS — Z8249 Family history of ischemic heart disease and other diseases of the circulatory system: Secondary | ICD-10-CM | POA: Diagnosis not present

## 2017-09-03 DIAGNOSIS — I6522 Occlusion and stenosis of left carotid artery: Secondary | ICD-10-CM

## 2017-09-03 DIAGNOSIS — R011 Cardiac murmur, unspecified: Secondary | ICD-10-CM

## 2017-09-03 DIAGNOSIS — E782 Mixed hyperlipidemia: Secondary | ICD-10-CM

## 2017-09-03 NOTE — Patient Instructions (Signed)
Medication Instructions:  Your physician recommends that you continue on your current medications as directed. Please refer to the Current Medication list given to you today.  Labwork: Your physician recommends that you return for lab work for fasting lipid profile and LFTs.  You will need to be fasting for this blood work.   Testing/Procedures: Your physician has requested that you have an echocardiogram. Echocardiography is a painless test that uses sound waves to create images of your heart. It provides your doctor with information about the size and shape of your heart and how well your heart's chambers and valves are working. This procedure takes approximately one hour. There are no restrictions for this procedure.  Follow-Up: Your physician wants you to follow-up in: 1 year with Dr. [name].  You will receive a reminder letter in the mail two months in advance. If you don't receive a letter, please call our office to schedule the follow-up appointment.  * If you need a refill on your cardiac medications before your next appointment, please call your pharmacy.   *Please note that any paperwork needing to be filled out by the provider will need to be addressed at the front desk prior to seeing the provider. Please note that any FMLA, disability or other documents regarding health condition is subject to a $25.00 charge that must be received prior to completion of paperwork in the form of a money order or check.  Thank you for choosing [facility]!!    Any Other Special Instructions Will Be Listed Below (If Applicable).  Stop smoking

## 2017-09-10 ENCOUNTER — Telehealth: Payer: Self-pay | Admitting: *Deleted

## 2017-09-10 ENCOUNTER — Ambulatory Visit (HOSPITAL_COMMUNITY): Payer: Medicare Other | Attending: Cardiovascular Disease

## 2017-09-10 ENCOUNTER — Other Ambulatory Visit: Payer: Self-pay

## 2017-09-10 ENCOUNTER — Other Ambulatory Visit: Payer: Medicare Other | Admitting: *Deleted

## 2017-09-10 DIAGNOSIS — I6522 Occlusion and stenosis of left carotid artery: Secondary | ICD-10-CM

## 2017-09-10 DIAGNOSIS — R011 Cardiac murmur, unspecified: Secondary | ICD-10-CM | POA: Diagnosis not present

## 2017-09-10 DIAGNOSIS — E782 Mixed hyperlipidemia: Secondary | ICD-10-CM

## 2017-09-10 DIAGNOSIS — Z8249 Family history of ischemic heart disease and other diseases of the circulatory system: Secondary | ICD-10-CM | POA: Diagnosis not present

## 2017-09-10 LAB — HEPATIC FUNCTION PANEL
ALT: 29 IU/L (ref 0–32)
AST: 24 IU/L (ref 0–40)
Albumin: 4.2 g/dL (ref 3.5–4.8)
Alkaline Phosphatase: 38 IU/L — ABNORMAL LOW (ref 39–117)
BILIRUBIN TOTAL: 0.3 mg/dL (ref 0.0–1.2)
BILIRUBIN, DIRECT: 0.13 mg/dL (ref 0.00–0.40)
TOTAL PROTEIN: 7 g/dL (ref 6.0–8.5)

## 2017-09-10 LAB — LIPID PANEL
Chol/HDL Ratio: 2.5 ratio (ref 0.0–4.4)
Cholesterol, Total: 110 mg/dL (ref 100–199)
HDL: 44 mg/dL (ref >39)
LDL CALC: 53 mg/dL (ref 0–99)
Triglycerides: 64 mg/dL (ref 0–149)
VLDL Cholesterol Cal: 13 mg/dL (ref 5–40)

## 2017-09-10 NOTE — Telephone Encounter (Signed)
Katrina in lab is calling to request entry of this pts lab orders, as advised by Estella Husk PA-C on 5/8 OV.  Sharyn Lull wanted the pt to come back in for fasting lipids and LFTs and lab appt made and no orders were placed.  Placed the lab orders in the system and pt will have them drawn now.

## 2017-09-11 ENCOUNTER — Telehealth: Payer: Self-pay | Admitting: Physician Assistant

## 2017-09-11 NOTE — Telephone Encounter (Signed)
Pt returning call to [facility]. Please call pt.

## 2017-09-11 NOTE — Telephone Encounter (Signed)
Informed pt of results. Pt verbalized understanding. 

## 2017-09-11 NOTE — Telephone Encounter (Signed)
Left message to call back  

## 2017-09-11 NOTE — Telephone Encounter (Signed)
New Message   Patient is returning call in reference to lab results. Please call to discuss.  

## 2017-09-16 DIAGNOSIS — H18413 Arcus senilis, bilateral: Secondary | ICD-10-CM | POA: Diagnosis not present

## 2017-09-16 DIAGNOSIS — H02839 Dermatochalasis of unspecified eye, unspecified eyelid: Secondary | ICD-10-CM | POA: Diagnosis not present

## 2017-09-16 DIAGNOSIS — H40003 Preglaucoma, unspecified, bilateral: Secondary | ICD-10-CM | POA: Diagnosis not present

## 2017-09-16 DIAGNOSIS — H2513 Age-related nuclear cataract, bilateral: Secondary | ICD-10-CM | POA: Diagnosis not present

## 2017-09-24 ENCOUNTER — Other Ambulatory Visit: Payer: Self-pay | Admitting: Family

## 2017-09-29 ENCOUNTER — Other Ambulatory Visit: Payer: Self-pay | Admitting: Family

## 2017-09-29 DIAGNOSIS — I6523 Occlusion and stenosis of bilateral carotid arteries: Secondary | ICD-10-CM

## 2017-10-01 ENCOUNTER — Ambulatory Visit (HOSPITAL_COMMUNITY)
Admission: RE | Admit: 2017-10-01 | Discharge: 2017-10-01 | Disposition: A | Discharge status: Home / Self Care | Payer: Medicare Other | Source: Ambulatory Visit | Attending: Cardiology | Admitting: Cardiology

## 2017-10-01 DIAGNOSIS — E785 Hyperlipidemia, unspecified: Secondary | ICD-10-CM | POA: Insufficient documentation

## 2017-10-01 DIAGNOSIS — I6523 Occlusion and stenosis of bilateral carotid arteries: Secondary | ICD-10-CM | POA: Insufficient documentation

## 2017-10-01 DIAGNOSIS — J449 Chronic obstructive pulmonary disease, unspecified: Secondary | ICD-10-CM | POA: Insufficient documentation

## 2017-10-01 DIAGNOSIS — F17200 Nicotine dependence, unspecified, uncomplicated: Secondary | ICD-10-CM | POA: Diagnosis not present

## 2017-10-09 ENCOUNTER — Telehealth: Payer: Self-pay | Admitting: Nurse Practitioner

## 2017-10-09 NOTE — Telephone Encounter (Signed)
Vit D refill. Pt states she takes it once a month but not on current medication list  Last OV: 06/19/17 PCP: Pricilla Holm [facility]   [place]

## 2017-10-09 NOTE — Telephone Encounter (Signed)
Copied from [place] [id number]. Topic: Quick Communication - Rx Refill/Question >> Oct 09, 2017  2:25 PM Boyd Kerbs wrote: Medication:  Vitamin D - she takes once a month   [facility]  Has the patient contacted their pharmacy? Yes.   (Agent: If no, request that the patient contact the pharmacy for the refill.) (Agent: If yes, when and what did the pharmacy advise?)  Preferred Pharmacy (with phone number or street name): [id number] [address] [place] [address] [place] [address] Phone: [phone] Fax: [phone]    Agent: Please be advised that RX refills may take up to 3 business days. We ask that you follow-up with your pharmacy.

## 2017-10-12 ENCOUNTER — Other Ambulatory Visit: Payer: Self-pay | Admitting: Nurse Practitioner

## 2017-10-12 DIAGNOSIS — J309 Allergic rhinitis, unspecified: Secondary | ICD-10-CM

## 2017-10-14 NOTE — Telephone Encounter (Signed)
LVM for pt to call back in regards.  

## 2017-10-14 NOTE — Telephone Encounter (Signed)
According to medication list, she is taking OTC Vitamin D3 2000 IU daily; she would need to get an updated vitamin D lab drawn to see if she needs prescriptive vitamin D; can be done at her wellness visit later this summer.

## 2017-10-28 ENCOUNTER — Other Ambulatory Visit: Payer: Self-pay | Admitting: Nurse Practitioner

## 2017-10-28 DIAGNOSIS — E039 Hypothyroidism, unspecified: Secondary | ICD-10-CM

## 2017-12-11 ENCOUNTER — Other Ambulatory Visit: Payer: Self-pay | Admitting: Physician Assistant

## 2017-12-17 ENCOUNTER — Encounter: Payer: Self-pay | Admitting: Nurse Practitioner

## 2017-12-17 ENCOUNTER — Ambulatory Visit (INDEPENDENT_AMBULATORY_CARE_PROVIDER_SITE_OTHER): Payer: Medicare Other | Admitting: Nurse Practitioner

## 2017-12-17 ENCOUNTER — Other Ambulatory Visit (INDEPENDENT_AMBULATORY_CARE_PROVIDER_SITE_OTHER): Payer: Medicare Other

## 2017-12-17 VITALS — BP 130/86 | HR 68 | Ht 63.0 in | Wt 152.0 lb

## 2017-12-17 DIAGNOSIS — M545 Low back pain, unspecified: Secondary | ICD-10-CM

## 2017-12-17 DIAGNOSIS — E039 Hypothyroidism, unspecified: Secondary | ICD-10-CM

## 2017-12-17 DIAGNOSIS — F329 Major depressive disorder, single episode, unspecified: Secondary | ICD-10-CM

## 2017-12-17 DIAGNOSIS — R7303 Prediabetes: Secondary | ICD-10-CM

## 2017-12-17 DIAGNOSIS — F419 Anxiety disorder, unspecified: Secondary | ICD-10-CM | POA: Diagnosis not present

## 2017-12-17 DIAGNOSIS — F32A Depression, unspecified: Secondary | ICD-10-CM

## 2017-12-17 HISTORY — DX: Prediabetes: R73.03

## 2017-12-17 LAB — TSH: TSH: 0.9 u[IU]/mL (ref 0.35–4.50)

## 2017-12-17 LAB — HEMOGLOBIN A1C: HEMOGLOBIN A1C: 6.1 % (ref 4.6–6.5)

## 2017-12-17 MED ORDER — SERTRALINE HCL 50 MG PO TABS: 50.0000 mg | ORAL_TABLET | Freq: Every day | ORAL | 1 refills | Status: DC

## 2017-12-17 MED ORDER — TIZANIDINE HCL 2 MG PO CAPS: 2.0000 mg | ORAL_CAPSULE | Freq: Three times a day (TID) | ORAL | 0 refills | Status: DC

## 2017-12-17 NOTE — Patient Instructions (Addendum)
I have sent a prescription for zoloft 50mg  tablets to your pharmacy. Please start 1/2 tablet once daily for 1 week and then increase to a full tablet once daily on week two as tolerated.  Some side effects such as nausea, drowsiness and weight gain can occur.  Also rarely people have experienced suicidal thoughts when taking this medication.  Please discontinue the medication and go directly to ED if this occurs.  Id like to see you back in about 1 month to evaluate progress.    You may try Heat, ice, muscle relaxer as prescribed and back exercises for your back pain. Muscle relaxers can cause drowsiness. Please do not drink alcohol or operate machinery when you take this medication. Please schedule a follow up appointment for further evaluation here with Dr Tamala Julian or Dr Raeford Razor, our sports medicine providers if your pain is not better in about 2 weeks.  Please return in about 1 month for follow up.  Back Exercises If you have pain in your back, do these exercises 2-3 times each day or as told by your doctor. When the pain goes away, do the exercises once each day, but repeat the steps more times for each exercise (do more repetitions). If you do not have pain in your back, do these exercises once each day or as told by your doctor. Exercises Single Knee to Chest  Do these steps 3-5 times in a row for each leg: 1. Lie on your back on a firm bed or the floor with your legs stretched out. 2. Bring one knee to your chest. 3. Hold your knee to your chest by grabbing your knee or thigh. 4. Pull on your knee until you feel a gentle stretch in your lower back. 5. Keep doing the stretch for 10-30 seconds. 6. Slowly let go of your leg and straighten it.  Pelvic Tilt  Do these steps 5-10 times in a row: 1. Lie on your back on a firm bed or the floor with your legs stretched out. 2. Bend your knees so they point up to the ceiling. Your feet should be flat on the floor. 3. Tighten your lower belly  (abdomen) muscles to press your lower back against the floor. This will make your tailbone point up to the ceiling instead of pointing down to your feet or the floor. 4. Stay in this position for 5-10 seconds while you gently tighten your muscles and breathe evenly.  Cat-Cow  Do these steps until your lower back bends more easily: 1. Get on your hands and knees on a firm surface. Keep your hands under your shoulders, and keep your knees under your hips. You may put padding under your knees. 2. Let your head hang down, and make your tailbone point down to the floor so your lower back is round like the back of a cat. 3. Stay in this position for 5 seconds. 4. Slowly lift your head and make your tailbone point up to the ceiling so your back hangs low (sags) like the back of a cow. 5. Stay in this position for 5 seconds.  Press-Ups  Do these steps 5-10 times in a row: 1. Lie on your belly (face-down) on the floor. 2. Place your hands near your head, about shoulder-width apart. 3. While you keep your back relaxed and keep your hips on the floor, slowly straighten your arms to raise the top half of your body and lift your shoulders. Do not use your back muscles. To make  yourself more comfortable, you may change where you place your hands. 4. Stay in this position for 5 seconds. 5. Slowly return to lying flat on the floor.  Bridges  Do these steps 10 times in a row: 1. Lie on your back on a firm surface. 2. Bend your knees so they point up to the ceiling. Your feet should be flat on the floor. 3. Tighten your butt muscles and lift your butt off of the floor until your waist is almost as high as your knees. If you do not feel the muscles working in your butt and the back of your thighs, slide your feet 1-2 inches farther away from your butt. 4. Stay in this position for 3-5 seconds. 5. Slowly lower your butt to the floor, and let your butt muscles relax.  If this exercise is too easy, try doing  it with your arms crossed over your chest. Belly Crunches  Do these steps 5-10 times in a row: 1. Lie on your back on a firm bed or the floor with your legs stretched out. 2. Bend your knees so they point up to the ceiling. Your feet should be flat on the floor. 3. Cross your arms over your chest. 4. Tip your chin a little bit toward your chest but do not bend your neck. 5. Tighten your belly muscles and slowly raise your chest just enough to lift your shoulder blades a tiny bit off of the floor. 6. Slowly lower your chest and your head to the floor.  Back Lifts Do these steps 5-10 times in a row: 1. Lie on your belly (face-down) with your arms at your sides, and rest your forehead on the floor. 2. Tighten the muscles in your legs and your butt. 3. Slowly lift your chest off of the floor while you keep your hips on the floor. Keep the back of your head in line with the curve in your back. Look at the floor while you do this. 4. Stay in this position for 3-5 seconds. 5. Slowly lower your chest and your face to the floor.  Contact a doctor if:  Your back pain gets a lot worse when you do an exercise.  Your back pain does not lessen 2 hours after you exercise. If you have any of these problems, stop doing the exercises. Do not do them again unless your doctor says it is okay. Get help right away if:  You have sudden, very bad back pain. If this happens, stop doing the exercises. Do not do them again unless your doctor says it is okay. This information is not intended to replace advice given to you by your health care provider. Make sure you discuss any questions you have with your health care provider. Document Released: 05/18/2010 Document Revised: 09/21/2015 Document Reviewed: 06/09/2014 Elsevier Interactive Patient Education  [employer].

## 2017-12-17 NOTE — Assessment & Plan Note (Signed)
Update labs F/U with further recommendations pending lab results - Hemoglobin A1c; Future  

## 2017-12-17 NOTE — Assessment & Plan Note (Signed)
Discussed options to treat anxiety and depression and she would like to try another medication, will start zoloft trial-dosing and side effects discussed Home management and return precautions discussed and printed on AVS She declines counseling RTC in 1 month for F/U - sertraline (ZOLOFT) 50 MG tablet; Take 1 tablet (50 mg total) by mouth daily.  Dispense: 30 tablet; Refill: 1

## 2017-12-17 NOTE — Progress Notes (Signed)
Name: Meagan Holmes   MRN: [id number]    DOB: 10-17-1940   Date:12/17/2017       Progress Note  Subjective  Chief Complaint Follow up  HPI  Meagan Holmes is here today for routine follow up of thyroid and pre-diabetes as instructed. She would like to discuss back pain and depression during todays visit as well.   Pre-diabetes- noted on 05/2017 labs, says she has not really changed her diet or exercise habits.  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  Lab Results  Component Value Date   TSH 0.88 04/17/2017   Lower back pain- This is a new complaint, began about 1 week ago Describes as tightness, sore pain in left lower back/left side, which seemed to begin after using a manual pump repeatedly to pump up a float.  She has tried heat but seemed to make the pain worse She denies fevers, chills, nausea, vomiting, abdominal pain, dysuria, hematuria, urinary frequency, constipation, diarrhea.  Anxiety and depression- This is not a new complaint, she reports shes felt anxious and depressed for years, especially after multiple family deaths over the past years She often worries and feels restless and down, thinking about loved ones that have passed away She says the only time she does not feel down is when she is busy with her family, but as soon as she is alone her mind races again She has tried to go to counseling but did not seem to help. She has also been on lexapro in past but did not really notice much difference on the lexapro so she stopped it She denies SI, HI  Patient Active Problem List   Diagnosis Date Noted  . Family history of early CAD 09/02/2017  . 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  . Medicare annual wellness visit, subsequent 08/08/2016  . Encounter for general adult medical examination with abnormal findings  08/08/2016  . Osteoarthritis 02/22/2016  . Triceps tendonitis 12/14/2015  . Heart murmur 07/06/2014  . Carotid artery disease ([place]) 05/21/2014  . COPD (chronic obstructive pulmonary disease) ([place]) 10/02/2010  . Osteoporosis 10/02/2010  . Annual physical exam 10/02/2010  . Hyperlipidemia 02/17/2009  . Hypothyroidism 02/10/2007  . Anxiety and depression 02/10/2007  . Allergic rhinitis 02/10/2007  . HEMATURIA, MICROSCOPIC, HX OF 02/10/2007    Past Surgical History:  Procedure Laterality Date  . APPENDECTOMY    . CARPAL TUNNEL RELEASE    . NASAL SEPTUM SURGERY    . THYROIDECTOMY      Family History  Problem Relation Age of Onset  . Heart attack Father   . Diabetes Mother   . Hypertension Mother   . Colon cancer Other        uncle, age?  . Breast cancer Neg Hx   . Cancer Neg Hx   . Heart disease Neg Hx   . Stroke Neg Hx     Social History   Socioeconomic History  . Marital status: Married    Spouse name: Not on file  . Number of children: 3  . Years of education: 30  . Highest education level: Not on file  Occupational History  . Occupation: retired  Scientific laboratory technician  . Financial resource strain: Not on file  . Food insecurity:    Worry: Not on file    Inability: Not on file  . Transportation needs:    Medical: Not  on file    Non-medical: Not on file  Tobacco Use  . Smoking status: Current Every Day Smoker    Packs/day: 0.75    Years: 58.00    Pack years: 43.50    Types: Cigarettes    Start date: 04/29/2012  . Smokeless tobacco: Never Used  . Tobacco comment: 1/2 ppd  Substance and Sexual Activity  . Alcohol use: No    Alcohol/week: 0.0 standard drinks  . Drug use: No  . Sexual activity: Not on file  Lifestyle  . Physical activity:    Days per week: Not on file    Minutes per session: Not on file  . Stress: Not on file  Relationships  . Social connections:    Talks on phone: Not on file    Gets together: Not on file    Attends religious service: Not  on file    Active member of club or organization: Not on file    Attends meetings of clubs or organizations: Not on file    Relationship status: Not on file  . Intimate partner violence:    Fear of current or ex partner: Not on file    Emotionally abused: Not on file    Physically abused: Not on file    Forced sexual activity: Not on file  Other Topics Concern  . Not on file  Social History Narrative   Lost son (to drugs) 2008     Husband ill, she takes care of him, raising her g-daughter    Born and raised in [place], [place]. Currently resides in a house with her husband and greatgrandaughter. Maltese dog. Fun: play games with family, gardening, gym   Denies religious beliefs that would effect health care.               Current Outpatient Medications:  .  aspirin 81 MG tablet, Take 81 mg by mouth daily., Disp: , Rfl:  .  atorvastatin (LIPITOR) 80 MG tablet, Take 1 tablet (80 mg total) by mouth daily., Disp: 90 tablet, Rfl: 2 .  calcium gluconate 500 MG tablet, Take 500 mg by mouth daily.  , Disp: , Rfl:  .  Cholecalciferol (VITAMIN D3 PO), Take 2,000 Units by mouth daily. , Disp: , Rfl:  .  levocetirizine (XYZAL) 5 MG tablet, TAKE 1 TABLET BY MOUTH EVERY DAY IN THE EVENING, Disp: 30 tablet, Rfl: 1 .  levothyroxine (SYNTHROID, LEVOTHROID) 88 MCG tablet, TAKE 1 TABLET (88 MCG TOTAL) BY MOUTH DAILY BEFORE BREAKFAST., Disp: 90 tablet, Rfl: 0 .  mometasone (NASONEX) 50 MCG/ACT nasal spray, Place 2 sprays into the nose daily., Disp: 17 g, Rfl: 12  Allergies  Allergen Reactions  . Amoxil [Amoxicillin] Hives     ROS See HPI  Objective  Vitals:   12/17/17 1043  BP: 130/86  Pulse: 68  SpO2: 95%  Weight: 152 lb (68.9 kg)  Height: 5\' 3"  (1.6 m)    Body mass index is 26.93 kg/m.  Physical Exam Vital signs reviewed. Constitutional: Patient appears well-developed and well-nourished. No distress.  HENT: Head: Normocephalic and atraumatic.  Nose: Nose normal. Mouth/Throat:  Oropharynx is clear and moist. No oropharyngeal exudate.  Eyes: Conjunctivae and EOM are normal. Pupils are equal, round, and reactive to light. No scleral icterus.  Neck: Normal range of motion. Neck supple. No thyromegaly present. No cervical adenopathy. Cardiovascular: Normal rate, regular rhythm and normal heart sounds.   No BLE edema. Distal pulses intact. Pulmonary/Chest: Effort normal and breath sounds normal. No  respiratory distress. Musculoskeletal: Normal range of motion,  No gross deformities. Thoracic and Lumbar back and bliateral hips without tenderness, swelling. Neurological: She is alert and oriented to person, place, and time. No cranial nerve deficit. Coordination, balance, strength, speech and gait are normal.  Skin: Skin is warm and dry. No rash noted. No erythema.  Psychiatric: Patient has a normal mood and affect. behavior is normal. Judgment and thought content normal.   Assessment & Plan RTC in 1 month for F/U: Anxiety and depression-starting zoloft  Acute left-sided low back pain without sciatica zanaflex sent- dosing and side effects discussed  Discussed home management and return precautions and provided additional information on AVS She was instructed to follow up with our sports med providers for further evaluation if back pain has not improved in 2 weeks - tizanidine (ZANAFLEX) 2 MG capsule; Take 1 capsule (2 mg total) by mouth 3 (three) times daily.  Dispense: 30 capsule; Refill: 0

## 2017-12-17 NOTE — Assessment & Plan Note (Signed)
Update labs F/U with further recommendations pending lab results Will send brand Rx refill of synthroid is TSH is stable - TSH; Future

## 2017-12-23 ENCOUNTER — Telehealth: Payer: Self-pay | Admitting: *Deleted

## 2017-12-23 ENCOUNTER — Other Ambulatory Visit: Payer: Self-pay | Admitting: Nurse Practitioner

## 2017-12-23 DIAGNOSIS — M545 Low back pain, unspecified: Secondary | ICD-10-CM

## 2017-12-23 DIAGNOSIS — E039 Hypothyroidism, unspecified: Secondary | ICD-10-CM

## 2017-12-23 DIAGNOSIS — Z1231 Encounter for screening mammogram for malignant neoplasm of breast: Secondary | ICD-10-CM | POA: Diagnosis not present

## 2017-12-23 LAB — HM MAMMOGRAPHY

## 2017-12-23 MED ORDER — TIZANIDINE HCL 2 MG PO TABS: 2.0000 mg | ORAL_TABLET | Freq: Three times a day (TID) | ORAL | 0 refills | Status: DC | PRN

## 2017-12-23 MED ORDER — SYNTHROID 88 MCG PO TABS: 88.0000 ug | ORAL_TABLET | Freq: Every day | ORAL | 2 refills | Status: DC

## 2017-12-23 NOTE — Telephone Encounter (Signed)
Tizanidine CAPS is not covered. Plan prefers Tizanidine TABS. Can pt take tablets instead?   If not, we need to to formulary exception.Ph # [phone].

## 2017-12-23 NOTE — Telephone Encounter (Signed)
New rx for Tizanidine 2 mg tablets sent in. See meds.

## 2017-12-23 NOTE — Telephone Encounter (Signed)
Yes tablets are okay

## 2017-12-26 ENCOUNTER — Encounter: Payer: Self-pay | Admitting: Nurse Practitioner

## 2018-01-08 ENCOUNTER — Other Ambulatory Visit: Payer: Self-pay | Admitting: Nurse Practitioner

## 2018-01-08 DIAGNOSIS — J309 Allergic rhinitis, unspecified: Secondary | ICD-10-CM

## 2018-01-15 ENCOUNTER — Ambulatory Visit: Payer: Medicare Other | Admitting: Nurse Practitioner

## 2018-01-21 ENCOUNTER — Other Ambulatory Visit: Payer: Self-pay | Admitting: Nurse Practitioner

## 2018-01-21 DIAGNOSIS — F419 Anxiety disorder, unspecified: Principal | ICD-10-CM

## 2018-01-21 DIAGNOSIS — F329 Major depressive disorder, single episode, unspecified: Secondary | ICD-10-CM

## 2018-01-28 ENCOUNTER — Telehealth: Payer: Self-pay | Admitting: Nurse Practitioner

## 2018-01-28 NOTE — Telephone Encounter (Signed)
Copied from [place] [id number]. Topic: General - Other >> Jan 28, 2018 11:35 AM Lennox Solders wrote: Reason for CRM: pt is calling and would like shambley to know she will not take sertraline due to reading all the side effects. Pt will let us know if she changes her mind

## 2018-01-28 NOTE — Telephone Encounter (Signed)
noted 

## 2018-03-24 DIAGNOSIS — H18413 Arcus senilis, bilateral: Secondary | ICD-10-CM | POA: Diagnosis not present

## 2018-03-24 DIAGNOSIS — H2513 Age-related nuclear cataract, bilateral: Secondary | ICD-10-CM | POA: Diagnosis not present

## 2018-03-24 DIAGNOSIS — H02831 Dermatochalasis of right upper eyelid: Secondary | ICD-10-CM | POA: Diagnosis not present

## 2018-03-24 DIAGNOSIS — H40003 Preglaucoma, unspecified, bilateral: Secondary | ICD-10-CM | POA: Diagnosis not present

## 2018-04-18 ENCOUNTER — Other Ambulatory Visit: Payer: Self-pay | Admitting: Nurse Practitioner

## 2018-04-18 DIAGNOSIS — F419 Anxiety disorder, unspecified: Principal | ICD-10-CM

## 2018-04-18 DIAGNOSIS — F329 Major depressive disorder, single episode, unspecified: Secondary | ICD-10-CM

## 2018-05-25 ENCOUNTER — Other Ambulatory Visit: Payer: Self-pay | Admitting: Nurse Practitioner

## 2018-05-25 DIAGNOSIS — E039 Hypothyroidism, unspecified: Secondary | ICD-10-CM

## 2018-07-09 ENCOUNTER — Other Ambulatory Visit: Payer: Self-pay

## 2018-07-09 ENCOUNTER — Other Ambulatory Visit (INDEPENDENT_AMBULATORY_CARE_PROVIDER_SITE_OTHER): Payer: Medicare Other

## 2018-07-09 ENCOUNTER — Ambulatory Visit (INDEPENDENT_AMBULATORY_CARE_PROVIDER_SITE_OTHER): Payer: Medicare Other | Admitting: Nurse Practitioner

## 2018-07-09 ENCOUNTER — Encounter: Payer: Self-pay | Admitting: Nurse Practitioner

## 2018-07-09 VITALS — BP 120/80 | HR 70 | Ht 63.0 in | Wt 153.0 lb

## 2018-07-09 DIAGNOSIS — F32A Depression, unspecified: Secondary | ICD-10-CM

## 2018-07-09 DIAGNOSIS — R7303 Prediabetes: Secondary | ICD-10-CM

## 2018-07-09 DIAGNOSIS — F329 Major depressive disorder, single episode, unspecified: Secondary | ICD-10-CM | POA: Diagnosis not present

## 2018-07-09 DIAGNOSIS — E039 Hypothyroidism, unspecified: Secondary | ICD-10-CM | POA: Diagnosis not present

## 2018-07-09 DIAGNOSIS — F419 Anxiety disorder, unspecified: Secondary | ICD-10-CM

## 2018-07-09 DIAGNOSIS — Z23 Encounter for immunization: Secondary | ICD-10-CM | POA: Diagnosis not present

## 2018-07-09 LAB — CBC
HCT: 42 % (ref 36.0–46.0)
Hemoglobin: 14.3 g/dL (ref 12.0–15.0)
MCHC: 33.9 g/dL (ref 30.0–36.0)
MCV: 91.2 fl (ref 78.0–100.0)
PLATELETS: 243 10*3/uL (ref 150.0–400.0)
RBC: 4.61 Mil/uL (ref 3.87–5.11)
RDW: 14.1 % (ref 11.5–15.5)
WBC: 7.2 10*3/uL (ref 4.0–10.5)

## 2018-07-09 LAB — HEMOGLOBIN A1C: Hgb A1c MFr Bld: 6 % (ref 4.6–6.5)

## 2018-07-09 LAB — TSH: TSH: 0.39 u[IU]/mL (ref 0.35–4.50)

## 2018-07-09 MED ORDER — LORAZEPAM 0.5 MG PO TABS: ORAL_TABLET | ORAL | 0 refills | Status: DC

## 2018-07-09 NOTE — Assessment & Plan Note (Signed)
Continue synthroid Update labs - TSH; Future

## 2018-07-09 NOTE — Assessment & Plan Note (Signed)
Discussed the role of healthy diet and exercise in the management of pre-diabetes Update labs F/U with further recommendations pending lab results - Hemoglobin A1c; Future - CBC; Future

## 2018-07-09 NOTE — Patient Instructions (Signed)
Head downstairs for labs  Return in about 3-4 months for regular follow up   Living With Anxiety  After being diagnosed with an anxiety disorder, you may be relieved to know why you have felt or behaved a certain way. It is natural to also feel overwhelmed about the treatment ahead and what it will mean for your life. With care and support, you can manage this condition and recover from it. How to cope with anxiety Dealing with stress Stress is your body's reaction to life changes and events, both good and bad. Stress can last just a few hours or it can be ongoing. Stress can play a major role in anxiety, so it is important to learn both how to cope with stress and how to think about it differently. Talk with your health care provider or a counselor to learn more about stress reduction. He or she may suggest some stress reduction techniques, such as:  Music therapy. This can include creating or listening to music that you enjoy and that inspires you.  Mindfulness-based meditation. This involves being aware of your normal breaths, rather than trying to control your breathing. It can be done while sitting or walking.  Centering prayer. This is a kind of meditation that involves focusing on a word, phrase, or sacred image that is meaningful to you and that brings you peace.  Deep breathing. To do this, expand your stomach and inhale slowly through your nose. Hold your breath for 3-5 seconds. Then exhale slowly, allowing your stomach muscles to relax.  Self-talk. This is a skill where you identify thought patterns that lead to anxiety reactions and correct those thoughts.  Muscle relaxation. This involves tensing muscles then relaxing them. Choose a stress reduction technique that fits your lifestyle and personality. Stress reduction techniques take time and practice. Set aside 5-15 minutes a day to do them. Therapists can offer training in these techniques. The training may be covered by some  insurance plans. Other things you can do to manage stress include:  Keeping a stress diary. This can help you learn what triggers your stress and ways to control your response.  Thinking about how you respond to certain situations. You may not be able to control everything, but you can control your reaction.  Making time for activities that help you relax, and not feeling guilty about spending your time in this way. Therapy combined with coping and stress-reduction skills provides the best chance for successful treatment. Medicines Medicines can help ease symptoms. Medicines for anxiety include:  Anti-anxiety drugs.  Antidepressants.  Beta-blockers. Medicines may be used as the main treatment for anxiety disorder, along with therapy, or if other treatments are not working. Medicines should be prescribed by a health care provider. Relationships Relationships can play a big part in helping you recover. Try to spend more time connecting with trusted friends and family members. Consider going to couples counseling, taking family education classes, or going to family therapy. Therapy can help you and others better understand the condition. How to recognize changes in your condition Everyone has a different response to treatment for anxiety. Recovery from anxiety happens when symptoms decrease and stop interfering with your daily activities at home or work. This may mean that you will start to:  Have better concentration and focus.  Sleep better.  Be less irritable.  Have more energy.  Have improved memory. It is important to recognize when your condition is getting worse. Contact your health care provider if your symptoms  interfere with home or work and you do not feel like your condition is improving. Where to find help and support: You can get help and support from these sources:  Self-help groups.  Online and [employer].  A trusted spiritual leader.  Couples  counseling.  Family education classes.  Family therapy. Follow these instructions at home:  Eat a healthy diet that includes plenty of vegetables, fruits, whole grains, low-fat dairy products, and lean protein. Do not eat a lot of foods that are high in solid fats, added sugars, or salt.  Exercise. Most adults should do the following: ? Exercise for at least 150 minutes each week. The exercise should increase your heart rate and make you sweat (moderate-intensity exercise). ? Strengthening exercises at least twice a week.  Cut down on caffeine, tobacco, alcohol, and other potentially harmful substances.  Get the right amount and quality of sleep. Most adults need 7-9 hours of sleep each night.  Make choices that simplify your life.  Take over-the-counter and prescription medicines only as told by your health care provider.  Avoid caffeine, alcohol, and certain over-the-counter cold medicines. These may make you feel worse. Ask your pharmacist which medicines to avoid.  Keep all follow-up visits as told by your health care provider. This is important. Questions to ask your health care provider  Would I benefit from therapy?  How often should I follow up with a health care provider?  How long do I need to take medicine?  Are there any long-term side effects of my medicine?  Are there any alternatives to taking medicine? Contact a health care provider if:  You have a hard time staying focused or finishing daily tasks.  You spend many hours a day feeling worried about everyday life.  You become exhausted by worry.  You start to have headaches, feel tense, or have nausea.  You urinate more than normal.  You have diarrhea. Get help right away if:  You have a racing heart and shortness of breath.  You have thoughts of hurting yourself or others. If you ever feel like you may hurt yourself or others, or have thoughts about taking your own life, get help right away. You  can go to your nearest emergency department or call:  Your local emergency services (911 in the [place]  A suicide crisis helpline, such as the [facility] at 7625063012. This is open 24-hours a day. Summary  Taking steps to deal with stress can help calm you.  Medicines cannot cure anxiety disorders, but they can help ease symptoms.  Family, friends, and partners can play a big part in helping you recover from an anxiety disorder. This information is not intended to replace advice given to you by your health care provider. Make sure you discuss any questions you have with your health care provider. Document Released: 04/09/2016 Document Revised: 04/09/2016 Document Reviewed: 04/09/2016 Elsevier Interactive Patient Education  2019 [employer].

## 2018-07-09 NOTE — Assessment & Plan Note (Signed)
We discussed the risks of adverse effects associated with benzodiazepines including falls, memory loss, addiction She would like to proceed Small ativan refill sent, with instructions to take PRN only, no more than 1-2 times per week-med dosing and side effects discussed Home management, red flags and return precautions including when to seek immediate care discussed and printed on AVS - TSH; Future - CBC; Future - LORazepam (ATIVAN) 0.5 MG tablet; TAKE 1 TABLET BY MOUTH TWICE A DAY AS NEEDED FOR ANXIETY  Dispense: 20 tablet; Refill: 0

## 2018-07-09 NOTE — Progress Notes (Signed)
Meagan Holmes is a 78 y.o. female with the following history as recorded in EpicCare:  Patient Active Problem List   Diagnosis Date Noted  . Pre-diabetes 12/17/2017  . Family history of early CAD 09/02/2017  . 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  . Medicare annual wellness visit, subsequent 08/08/2016  . Encounter for general adult medical examination with abnormal findings 08/08/2016  . Osteoarthritis 02/22/2016  . Triceps tendonitis 12/14/2015  . Heart murmur 07/06/2014  . Carotid artery disease ([place]) 05/21/2014  . COPD (chronic obstructive pulmonary disease) ([place]) 10/02/2010  . Osteoporosis 10/02/2010  . Annual physical exam 10/02/2010  . Hyperlipidemia 02/17/2009  . Hypothyroidism 02/10/2007  . Anxiety and depression 02/10/2007  . Allergic rhinitis 02/10/2007  . HEMATURIA, MICROSCOPIC, HX OF 02/10/2007    Current Outpatient Medications  Medication Sig Dispense Refill  . aspirin 81 MG tablet Take 81 mg by mouth daily.    [name] atorvastatin (LIPITOR) 80 MG tablet Take 1 tablet (80 mg total) by mouth daily. 90 tablet 2  . calcium gluconate 500 MG tablet Take 500 mg by mouth daily.      . Cholecalciferol (VITAMIN D3 PO) Take 2,000 Units by mouth daily.     [name] levocetirizine (XYZAL) 5 MG tablet TAKE 1 TABLET BY MOUTH EVERY DAY IN THE EVENING 30 tablet 1  . mometasone (NASONEX) 50 MCG/ACT nasal spray Place 2 sprays into the nose daily. 17 g 12  . SYNTHROID 88 MCG tablet TAKE 1 TABLET (88 MCG TOTAL) BY MOUTH DAILY BEFORE BREAKFAST. 90 tablet 2  . tiZANidine (ZANAFLEX) 2 MG tablet Take 1 tablet (2 mg total) by mouth 3 (three) times daily as needed for muscle spasms. Please cancel 12/17/17 rx for the CAPSULES. 30 tablet 0  . LORazepam (ATIVAN) 0.5 MG tablet TAKE 1 TABLET BY MOUTH TWICE A DAY AS NEEDED FOR ANXIETY 20 tablet 0   No current facility-administered medications for this visit.     Allergies: Amoxil [amoxicillin]  Past Medical History:  Diagnosis Date   . Allergic rhinitis    a lot better after septoplasty- remote  . Anxiety and depression 02/10/2007  . COPD (chronic obstructive pulmonary disease) ([place]) 10/02/2010  . Hematuria 2004   (-) CT abd-pelvis (-) cystoscopy  . Hypothyroidism   . Osteopenia     Past Surgical History:  Procedure Laterality Date  . APPENDECTOMY    . CARPAL TUNNEL RELEASE    . NASAL SEPTUM SURGERY    . THYROIDECTOMY      Family History  Problem Relation Age of Onset  . Heart attack Father   . Diabetes Mother   . Hypertension Mother   . Colon cancer Other        uncle, age?  . Breast cancer Neg Hx   . Cancer Neg Hx   . Heart disease Neg Hx   . Stroke Neg Hx     Social History   Tobacco Use  . Smoking status: Current Every Day Smoker    Packs/day: 0.75    Years: 58.00    Pack years: 43.50    Types: Cigarettes    Start date: 04/29/2012  . Smokeless tobacco: Never Used  . Tobacco comment: 1/2 ppd  Substance Use Topics  . Alcohol use: No    Alcohol/week: 0.0 standard drinks     Subjective:  Meagan Holmes Is here today for follow up of anxiety and depression, which we dicussed at her 12/17/17 OV, she was given prescription for zoloft but says she  actually never started it after reading about negative side effects. She says she is more anxious than depressed, was on ativan PRN in the past and would like to have prescription for that, only took about 1-2 times per week when she felt very anxious or irritated which helped her feel calm, she did not notice any adverse effects, and does not feel like she needs a daily medication. We will also update labs today for hypothyroidism and prediabetes Maintained on synthroid 88 daily for some time now, without noted adverse effects, tolerating well. Has been trying to exercise regularly, 15 minutes a day. No fevers, chills, cp, sob, fatigue, nausea, vomiting, si, hi.  ROS- See HPI  Objective:  Vitals:   07/09/18 1036  BP: 120/80  Pulse: 70  SpO2: 96%  Weight:  153 lb (69.4 kg)  Height: 5\' 3"  (1.6 m)    General: Well developed, well nourished, in no acute distress  Skin : Warm and dry.  Head: Normocephalic and atraumatic  Eyes: Sclera and conjunctiva clear; pupils round and reactive to light; extraocular movements intact  Oropharynx: Pink, supple. No suspicious lesions  Neck: Supple without thyromegaly Lungs: Respirations unlabored; clear to auscultation bilaterally without wheeze, rales, rhonchi  CVS exam: normal rate and regular rhythm, S1 and S2 normal.  Extremities: No edema, cyanosis, clubbing  Vessels: Symmetric bilaterally  Neurologic: Alert and oriented; speech intact; face symmetrical; moves all extremities well; CNII-XII intact without focal deficit  Psychiatric: Normal mood and affect.  Assessment:  1. Anxiety and depression   2. Acquired hypothyroidism   3. Pre-diabetes   4. Need for Tdap vaccination     Plan:   Need for Tdap vaccination - Tdap vaccine greater than or equal to 7yo IM  Return in about 3 months (around 10/09/2018). Orders Placed This Encounter  Procedures  . Tdap vaccine greater than or equal to 7yo IM  . Hemoglobin A1c    Standing Status:   Future    Standing Expiration Date:   07/09/2019  . TSH    Standing Status:   Future    Standing Expiration Date:   07/09/2019  . CBC    Standing Status:   Future    Standing Expiration Date:   07/09/2019    Requested Prescriptions   Signed Prescriptions Disp Refills  . LORazepam (ATIVAN) 0.5 MG tablet 20 tablet 0    Sig: TAKE 1 TABLET BY MOUTH TWICE A DAY AS NEEDED FOR ANXIETY

## 2018-07-10 ENCOUNTER — Telehealth: Payer: Self-pay | Admitting: Nurse Practitioner

## 2018-07-10 NOTE — Telephone Encounter (Signed)
Copied from [id number]. Topic: Quick Communication - Lab Results (Clinic Use ONLY) >> Jul 10, 2018  9:11 AM Cresenciano Lick, CMA wrote: Left message for pt to call back Re: recent labs.  [facility] may inform patient of results.   Pt called back requesting labs

## 2018-07-10 NOTE — Telephone Encounter (Signed)
Attempted to call patient to give lab results. No answer, left message for patient to call office back.

## 2018-08-31 ENCOUNTER — Telehealth: Payer: Self-pay | Admitting: Physician Assistant

## 2018-08-31 NOTE — Telephone Encounter (Signed)
New message   Added to 5/6 schedule please call to setup for virtual appointment

## 2018-09-01 ENCOUNTER — Other Ambulatory Visit: Payer: Self-pay | Admitting: *Deleted

## 2018-09-01 ENCOUNTER — Telehealth: Payer: Self-pay

## 2018-09-01 DIAGNOSIS — F329 Major depressive disorder, single episode, unspecified: Secondary | ICD-10-CM

## 2018-09-01 DIAGNOSIS — F419 Anxiety disorder, unspecified: Principal | ICD-10-CM

## 2018-09-01 DIAGNOSIS — F32A Depression, unspecified: Secondary | ICD-10-CM

## 2018-09-01 MED ORDER — LORAZEPAM 0.5 MG PO TABS: ORAL_TABLET | ORAL | 0 refills | Status: DC

## 2018-09-01 NOTE — Telephone Encounter (Signed)

## 2018-09-01 NOTE — Telephone Encounter (Signed)
[place] Controlled Database Checked Last filled: 07/09/18 # 20 LOV: 07/09/18 Next appt: 10/14/18 w/ Baldo Ash  ?

## 2018-09-01 NOTE — Telephone Encounter (Signed)
Patient received instructions.

## 2018-09-01 NOTE — Progress Notes (Signed)
Virtual Visit via Video Note   This visit type was conducted due to national recommendations for restrictions regarding the COVID-19 Pandemic (e.g. social distancing) in an effort to limit this patient's exposure and mitigate transmission in our community.  Due to her co-morbid illnesses, this patient is at least at moderate risk for complications without adequate follow up.  This format is felt to be most appropriate for this patient at this time.  All issues noted in this document were discussed and addressed.  A limited physical exam was performed with this format.  Please refer to the patient's chart for her consent to telehealth for [facility].   Date:  09/02/2018   ID:  [name], DOB 1941-01-24, MRN [id number]  Patient Location: Home Provider Location: Home  PCP:  Lance Sell, NP  Cardiologist:  Ena Dawley, MD  Electrophysiologist:  None   Evaluation Performed:  Follow-Up Visit  Chief Complaint:  Yearly f/u  History of Present Illness:    Meagan Holmes is a 78 y.o. female with with family history of CAD,HLD, tobacco abuse and carotid stenosis with 71-06% LICA in the past that showed regression to 1-39% 08/2016 dopplers. Normal echo 2016.   I saw the patient 08/2017 at which time she was doing well but was still smoking and had multiple family stressors.She did have a heart murmur but was asymptomatic. Echo Normal LV function with grade 2 DD. No valvular problems and carotids 1-39%     The patient does not have symptoms concerning for COVID-19 infection (fever, chills, cough, or new shortness of breath).    Past Medical History:  Diagnosis Date  . Allergic rhinitis    a lot better after septoplasty- remote  . Anxiety and depression 02/10/2007  . COPD (chronic obstructive pulmonary disease) ([place]) 10/02/2010  . Hematuria 2004   (-) CT abd-pelvis (-) cystoscopy  . Hypothyroidism   . Osteopenia    Past Surgical History:  Procedure Laterality Date  .  APPENDECTOMY    . CARPAL TUNNEL RELEASE    . NASAL SEPTUM SURGERY    . THYROIDECTOMY       Current Meds  Medication Sig  . aspirin 81 MG tablet Take 81 mg by mouth daily.  [name] atorvastatin (LIPITOR) 80 MG tablet Take 1 tablet (80 mg total) by mouth daily.  . calcium gluconate 500 MG tablet Take 500 mg by mouth daily.    . Cholecalciferol (VITAMIN D3 PO) Take 2,000 Units by mouth daily.   [name] levocetirizine (XYZAL) 5 MG tablet TAKE 1 TABLET BY MOUTH EVERY DAY IN THE EVENING  . LORazepam (ATIVAN) 0.5 MG tablet TAKE 1 TABLET BY MOUTH TWICE A DAY AS NEEDED FOR ANXIETY  . mometasone (NASONEX) 50 MCG/ACT nasal spray Place 2 sprays into the nose daily.  [name] SYNTHROID 88 MCG tablet TAKE 1 TABLET (88 MCG TOTAL) BY MOUTH DAILY BEFORE BREAKFAST.  [name] tiZANidine (ZANAFLEX) 2 MG tablet Take 1 tablet (2 mg total) by mouth 3 (three) times daily as needed for muscle spasms. Please cancel 12/17/17 rx for the CAPSULES.     Allergies:   Amoxil [amoxicillin]   Social History   Tobacco Use  . Smoking status: Current Every Day Smoker    Packs/day: 0.75    Years: 58.00    Pack years: 43.50    Types: Cigarettes    Start date: 04/29/2012  . Smokeless tobacco: Never Used  . Tobacco comment: 1/2 ppd  Substance Use Topics  . Alcohol use:  No    Alcohol/week: 0.0 standard drinks  . Drug use: No     Family Hx: The patient's family history includes Colon cancer in an other family member; Diabetes in her mother; Heart attack in her father; Hypertension in her mother. There is no history of Breast cancer, Cancer, Heart disease, or Stroke.  ROS:   Please see the history of present illness.    Review of Systems  Constitution: Negative.  HENT: Negative.   Eyes: Negative.   Cardiovascular: Negative.   Respiratory: Negative.   Hematologic/Lymphatic: Negative.   Musculoskeletal: Negative.  Negative for joint pain.  Gastrointestinal: Negative.   Genitourinary: Negative.   Neurological: Negative.     All other  systems reviewed and are negative.   Prior CV studies:   The following studies were reviewed today:  Carotid dopplers 09/2017 Right Carotid: Velocities in the right ICA are consistent with a 1-39% stenosis.                The RICA velocities remain within normal range and stable                compared to the prior exam.   Left Carotid: Velocities in the left ICA are consistent with a 1-39% stenosis.               The LICA velocities remain within normal range and stable compared               to the prior exam.   Vertebrals:  Bilateral vertebral arteries demonstrate antegrade flow. Subclavians: Normal flow hemodynamics were seen in bilateral subclavian              arteries  Echo 5/2019Study Conclusions   - Left ventricle: The cavity size was normal. Wall thickness was   normal. Systolic function was normal. The estimated ejection   fraction was in the range of 60% to 65%. Wall motion was normal;   there were no regional wall motion abnormalities. Features are   consistent with a pseudonormal left ventricular filling pattern,   with concomitant abnormal relaxation and increased filling   pressure (grade 2 diastolic dysfunction).      Echo 3/2016Study Conclusions  - Left ventricle: The cavity size was normal. Systolic function was   normal. The estimated ejection fraction was in the range of 55%   to 60%. Wall motion was normal; there were no regional wall   motion abnormalities. - Atrial septum: No defect or patent foramen ovale was identified.   Carotid Dopplers 09/26/16 bilateral 1-39% ICA stenosis. Left ICA has regressed.     Labs/Other Tests and Data Reviewed:    EKG:  An ECG dated 09/03/17 was personally reviewed today and demonstrated:  NSR no acute change  Recent Labs: 09/10/2017: ALT 29 07/09/2018: Hemoglobin 14.3; Platelets 243.0; TSH 0.39   Recent Lipid Panel Lab Results  Component Value Date/Time   CHOL 110 09/10/2017 11:42 AM   TRIG 64 09/10/2017 11:42  AM   TRIG 83 02/25/2006 01:12 PM   HDL 44 09/10/2017 11:42 AM   CHOLHDL 2.5 09/10/2017 11:42 AM   CHOLHDL 3 08/08/2016 12:04 PM   LDLCALC 53 09/10/2017 11:42 AM   LDLDIRECT 171.2 03/19/2012 11:29 AM    Wt Readings from Last 3 Encounters:  07/09/18 153 lb (69.4 kg)  12/17/17 152 lb (68.9 kg)  09/03/17 156 lb (70.8 kg)     Objective:    Vital Signs:  Ht 5\' 3"  (1.6 m)   BMI  27.10 kg/m      ASSESSMENT & PLAN:    1. Carotid stenosis 1-39% bilateral stenosis 09/2017 2. Family hx of CAD aggressive risk factor modification 3. Nicotine dependence 4. Hyperlipidemia-LDL 53 08/2017  COVID-19 Education: The signs and symptoms of COVID-19 were discussed with the patient and how to seek care for testing (follow up with PCP or arrange E-visit).   The importance of social distancing was discussed today.  Time:   Today, I have spent  minutes with the patient with telehealth technology discussing the above problems.     Medication Adjustments/Labs and Tests Ordered: Current medicines are reviewed at length with the patient today.  Concerns regarding medicines are outlined above.   Tests Ordered: No orders of the defined types were placed in this encounter.   Medication Changes: No orders of the defined types were placed in this encounter.   Disposition:  Follow up   Signed, Ermalinda Barrios, PA-C  09/02/2018 10:35 AM    [place] This encounter was created in error - please disregard.

## 2018-09-02 ENCOUNTER — Encounter

## 2018-09-02 ENCOUNTER — Other Ambulatory Visit: Payer: Self-pay

## 2018-09-02 ENCOUNTER — Encounter: Payer: Medicare Other | Admitting: Physician Assistant

## 2018-09-02 VITALS — Ht 63.0 in

## 2018-09-02 DIAGNOSIS — I6523 Occlusion and stenosis of bilateral carotid arteries: Principal | ICD-10-CM

## 2018-09-02 DIAGNOSIS — Z8249 Family history of ischemic heart disease and other diseases of the circulatory system: Secondary | ICD-10-CM

## 2018-09-02 DIAGNOSIS — F17209 Nicotine dependence, unspecified, with unspecified nicotine-induced disorders: Secondary | ICD-10-CM

## 2018-09-02 DIAGNOSIS — E782 Mixed hyperlipidemia: Secondary | ICD-10-CM

## 2018-09-04 ENCOUNTER — Telehealth: Payer: Self-pay | Admitting: Internal Medicine

## 2018-09-04 NOTE — Telephone Encounter (Signed)
LVM for patient to call back to make [facility] appt with Dr. Jenny Reichmann and a CPE appt, she is due anytime for a CPE

## 2018-09-04 NOTE — Telephone Encounter (Signed)
-----   Message from Biagio Borg, MD sent at 08/29/2018  3:34 PM EDT ----- Regarding: transfer of care Ms Meagan Holmes or other [place],  Pt is due for CPX and f/u PreDM; ok to cancel June 17 appt with [place]  Please contact this patient, and ask them to schedule with me as new PCP asap (even same day if possible) when you are able to address this, in order for the patient to continue have updated ongoing care without being lost in the system, and to keep up a high standard of care at [facility].  This visit would be for "office visit" to f/u general medical conditions (such as Diabetics who are due for f/u A1c or other chronic medical problem) or could be CPX if the patient does not necessarily need specific chronic condition follow up and wants this.     These visits can be In Person (if no fever symptoms), or Virtual.  I would prefer in person during day hours at the office, but either is ok.  Exceptions can be made to scheduling of currently ill patients in person who happen to have a current issue such as feeling feverish and UTI symptoms or other infection (just not anything related to possible COVID19 symptoms)  Please be aware I would like 4 [place] (virtual) visits if possible each Mon/Tues/Wed/Thur evenings that I can do from home.  The template has been changed to reflect the ability to do this, and I will just need to be aware to do this by watching my schedule.

## 2018-09-14 ENCOUNTER — Other Ambulatory Visit: Payer: Self-pay | Admitting: Physician Assistant

## 2018-09-29 ENCOUNTER — Telehealth: Payer: Medicare Other | Admitting: Cardiology

## 2018-10-14 ENCOUNTER — Ambulatory Visit: Payer: Medicare Other | Admitting: Nurse Practitioner

## 2018-10-16 ENCOUNTER — Other Ambulatory Visit: Payer: Self-pay | Admitting: Physician Assistant

## 2018-10-27 ENCOUNTER — Ambulatory Visit (INDEPENDENT_AMBULATORY_CARE_PROVIDER_SITE_OTHER): Payer: Medicare Other | Admitting: Internal Medicine

## 2018-10-27 ENCOUNTER — Encounter: Payer: Self-pay | Admitting: Internal Medicine

## 2018-10-27 ENCOUNTER — Other Ambulatory Visit (INDEPENDENT_AMBULATORY_CARE_PROVIDER_SITE_OTHER): Payer: Medicare Other

## 2018-10-27 ENCOUNTER — Other Ambulatory Visit: Payer: Self-pay

## 2018-10-27 VITALS — BP 130/76 | HR 60 | Temp 98.5°F | Ht 63.0 in | Wt 151.0 lb

## 2018-10-27 DIAGNOSIS — E611 Iron deficiency: Secondary | ICD-10-CM | POA: Diagnosis not present

## 2018-10-27 DIAGNOSIS — R7303 Prediabetes: Secondary | ICD-10-CM | POA: Diagnosis not present

## 2018-10-27 DIAGNOSIS — F419 Anxiety disorder, unspecified: Secondary | ICD-10-CM

## 2018-10-27 DIAGNOSIS — E538 Deficiency of other specified B group vitamins: Secondary | ICD-10-CM

## 2018-10-27 DIAGNOSIS — Z0000 Encounter for general adult medical examination without abnormal findings: Secondary | ICD-10-CM

## 2018-10-27 DIAGNOSIS — E559 Vitamin D deficiency, unspecified: Secondary | ICD-10-CM | POA: Diagnosis not present

## 2018-10-27 DIAGNOSIS — F329 Major depressive disorder, single episode, unspecified: Secondary | ICD-10-CM

## 2018-10-27 LAB — URINALYSIS, ROUTINE W REFLEX MICROSCOPIC
Bilirubin Urine: NEGATIVE
Ketones, ur: NEGATIVE
Nitrite: NEGATIVE
Specific Gravity, Urine: 1.02 (ref 1.000–1.030)
Total Protein, Urine: NEGATIVE
Urine Glucose: NEGATIVE
Urobilinogen, UA: 0.2 (ref 0.0–1.0)
pH: 6 (ref 5.0–8.0)

## 2018-10-27 LAB — BASIC METABOLIC PANEL
BUN: 13 mg/dL (ref 6–23)
CO2: 28 mEq/L (ref 19–32)
Calcium: 9.5 mg/dL (ref 8.4–10.5)
Chloride: 104 mEq/L (ref 96–112)
Creatinine, Ser: 0.76 mg/dL (ref 0.40–1.20)
GFR: 73.54 mL/min (ref >60.00)
Glucose, Bld: 90 mg/dL (ref 70–99)
Potassium: 4.4 mEq/L (ref 3.5–5.1)
Sodium: 139 mEq/L (ref 135–145)

## 2018-10-27 LAB — TSH: TSH: 0.71 u[IU]/mL (ref 0.35–4.50)

## 2018-10-27 LAB — IBC PANEL
Iron: 81 ug/dL (ref 42–145)
Saturation Ratios: 26.8 % (ref 20.0–50.0)
Transferrin: 216 mg/dL (ref 212.0–360.0)

## 2018-10-27 LAB — VITAMIN B12: Vitamin B-12: 617 pg/mL (ref 211–911)

## 2018-10-27 LAB — CBC WITH DIFFERENTIAL/PLATELET
Basophils Absolute: 0.1 10*3/uL (ref 0.0–0.1)
Basophils Relative: 1.1 % (ref 0.0–3.0)
Eosinophils Absolute: 0.3 10*3/uL (ref 0.0–0.7)
Eosinophils Relative: 3.6 % (ref 0.0–5.0)
HCT: 41.8 % (ref 36.0–46.0)
Hemoglobin: 14.2 g/dL (ref 12.0–15.0)
Lymphocytes Relative: 26 % (ref 12.0–46.0)
Lymphs Abs: 2.2 10*3/uL (ref 0.7–4.0)
MCHC: 34 g/dL (ref 30.0–36.0)
MCV: 91.6 fl (ref 78.0–100.0)
Monocytes Absolute: 0.6 10*3/uL (ref 0.1–1.0)
Monocytes Relative: 7.6 % (ref 3.0–12.0)
Neutro Abs: 5.1 10*3/uL (ref 1.4–7.7)
Neutrophils Relative %: 61.7 % (ref 43.0–77.0)
Platelets: 222 10*3/uL (ref 150.0–400.0)
RBC: 4.56 Mil/uL (ref 3.87–5.11)
RDW: 14 % (ref 11.5–15.5)
WBC: 8.3 10*3/uL (ref 4.0–10.5)

## 2018-10-27 LAB — HEPATIC FUNCTION PANEL
ALT: 21 U/L (ref 0–35)
AST: 23 U/L (ref 0–37)
Albumin: 4.3 g/dL (ref 3.5–5.2)
Alkaline Phosphatase: 43 U/L (ref 39–117)
Bilirubin, Direct: 0.1 mg/dL (ref 0.0–0.3)
Total Bilirubin: 0.5 mg/dL (ref 0.2–1.2)
Total Protein: 7.6 g/dL (ref 6.0–8.3)

## 2018-10-27 LAB — LIPID PANEL
Cholesterol: 115 mg/dL (ref 0–200)
HDL: 40.2 mg/dL (ref >39.00)
LDL Cholesterol: 53 mg/dL (ref 0–99)
NonHDL: 74.59
Total CHOL/HDL Ratio: 3
Triglycerides: 106 mg/dL (ref 0.0–149.0)
VLDL: 21.2 mg/dL (ref 0.0–40.0)

## 2018-10-27 LAB — HEMOGLOBIN A1C: Hgb A1c MFr Bld: 6.1 % (ref 4.6–6.5)

## 2018-10-27 LAB — VITAMIN D 25 HYDROXY (VIT D DEFICIENCY, FRACTURES): VITD: 94.47 ng/mL (ref 30.00–100.00)

## 2018-10-27 MED ORDER — LORAZEPAM 0.5 MG PO TABS: ORAL_TABLET | ORAL | 1 refills | Status: DC

## 2018-10-27 NOTE — Progress Notes (Signed)
Subjective:    Patient ID: Meagan Holmes, female    DOB: 07-17-1940, 78 y.o.   MRN: [id number]  HPI  Here for wellness and f/u;  Overall doing ok;  Pt denies Chest pain, worsening SOB, DOE, wheezing, orthopnea, PND, worsening LE edema, palpitations, dizziness or syncope.  Pt denies neurological change such as new headache, facial or extremity weakness.  Pt denies polydipsia, polyuria, or low sugar symptoms. Pt states overall good compliance with treatment and medications, good tolerability, and has been trying to follow appropriate diet.  Pt denies worsening depressive symptoms, suicidal ideation or panic. No fever, night sweats, wt loss, loss of appetite, or other constitutional symptoms.  Pt states good ability with ADL's, has low fall risk, home safety reviewed and adequate, no other significant changes in hearing or vision, and only occasionally active with exercise.  Never took a an antidepressant given pre last provider.  Several deaths in the family, occas grieving some days an ativan helps. No new complaints Past Medical History:  Diagnosis Date  . Allergic rhinitis    a lot better after septoplasty- remote  . Anxiety and depression 02/10/2007  . COPD (chronic obstructive pulmonary disease) ([place]) 10/02/2010  . Hematuria 2004   (-) CT abd-pelvis (-) cystoscopy  . Hypothyroidism   . Osteopenia    Past Surgical History:  Procedure Laterality Date  . APPENDECTOMY    . CARPAL TUNNEL RELEASE    . NASAL SEPTUM SURGERY    . THYROIDECTOMY      reports that she has been smoking cigarettes. She started smoking about 6 years ago. She has a 43.50 pack-year smoking history. She has never used smokeless tobacco. She reports that she does not drink alcohol or use drugs. family history includes Colon cancer in an other family member; Diabetes in her mother; Heart attack in her father; Hypertension in her mother. Allergies  Allergen Reactions  . Amoxil [Amoxicillin] Hives   Current Outpatient  Medications on File Prior to Visit  Medication Sig Dispense Refill  . aspirin 81 MG tablet Take 81 mg by mouth daily.    [name] atorvastatin (LIPITOR) 80 MG tablet Take 1 tablet (80 mg total) by mouth daily at 6 PM. Pt must keep upcoming appt in Aug to get further refills 90 tablet 0  . calcium gluconate 500 MG tablet Take 500 mg by mouth daily.      . Cholecalciferol (VITAMIN D3 PO) Take 2,000 Units by mouth daily.     [name] levocetirizine (XYZAL) 5 MG tablet TAKE 1 TABLET BY MOUTH EVERY DAY IN THE EVENING 30 tablet 1  . mometasone (NASONEX) 50 MCG/ACT nasal spray Place 2 sprays into the nose daily. 17 g 12  . SYNTHROID 88 MCG tablet TAKE 1 TABLET (88 MCG TOTAL) BY MOUTH DAILY BEFORE BREAKFAST. 90 tablet 2  . tiZANidine (ZANAFLEX) 2 MG tablet Take 1 tablet (2 mg total) by mouth 3 (three) times daily as needed for muscle spasms. Please cancel 12/17/17 rx for the CAPSULES. (Patient not taking: Reported on 10/27/2018) 30 tablet 0   No current facility-administered medications on file prior to visit.    Review of Systems Constitutional: Negative for other unusual diaphoresis, sweats, appetite or weight changes HENT: Negative for other worsening hearing loss, ear pain, facial swelling, mouth sores or neck stiffness.   Eyes: Negative for other worsening pain, redness or other visual disturbance.  Respiratory: Negative for other stridor or swelling Cardiovascular: Negative for other palpitations or other chest pain  Gastrointestinal:  Negative for worsening diarrhea or loose stools, blood in stool, distention or other pain Genitourinary: Negative for hematuria, flank pain or other change in urine volume.  Musculoskeletal: Negative for myalgias or other joint swelling.  Skin: Negative for other color change, or other wound or worsening drainage.  Neurological: Negative for other syncope or numbness. Hematological: Negative for other adenopathy or swelling Psychiatric/Behavioral: Negative for hallucinations,  other worsening agitation, SI, self-injury, or new decreased concentration All other system neg per pt    Objective:   Physical Exam BP 130/76 (BP Location: Left Arm, Patient Position: Sitting, Cuff Size: Normal)   Pulse 60   Temp 98.5 F (36.9 C) (Oral)   Ht 5\' 3"  (1.6 m)   Wt 151 lb (68.5 kg)   SpO2 96%   BMI 26.75 kg/m  VS noted,  Constitutional: Pt is oriented to person, place, and time. Appears well-developed and well-nourished, in no significant distress and comfortable Head: Normocephalic and atraumatic  Eyes: Conjunctivae and EOM are normal. Pupils are equal, round, and reactive to light Right Ear: External ear normal without discharge Left Ear: External ear normal without discharge Nose: Nose without discharge or deformity Mouth/Throat: Oropharynx is without other ulcerations and moist  Neck: Normal range of motion. Neck supple. No JVD present. No tracheal deviation present or significant neck LA or mass Cardiovascular: Normal rate, regular rhythm, normal heart sounds and intact distal pulses.   Pulmonary/Chest: WOB normal and breath sounds without rales or wheezing  Abdominal: Soft. Bowel sounds are normal. NT. No HSM  Musculoskeletal: Normal range of motion. Exhibits no edema Lymphadenopathy: Has no other cervical adenopathy.  Neurological: Pt is alert and oriented to person, place, and time. Pt has normal reflexes. No cranial nerve deficit. Motor grossly intact, Gait intact Skin: Skin is warm and dry. No rash noted or new ulcerations Psychiatric:  Has normal mood and affect. Behavior is normal without agitation No other exam findings Lab Results  Component Value Date   WBC 8.3 10/27/2018   HGB 14.2 10/27/2018   HCT 41.8 10/27/2018   PLT 222.0 10/27/2018   GLUCOSE 90 10/27/2018   CHOL 115 10/27/2018   TRIG 106.0 10/27/2018   HDL 40.20 10/27/2018   LDLDIRECT 171.2 03/19/2012   LDLCALC 53 10/27/2018   ALT 21 10/27/2018   AST 23 10/27/2018   NA 139 10/27/2018    K 4.4 10/27/2018   CL 104 10/27/2018   CREATININE 0.76 10/27/2018   BUN 13 10/27/2018   CO2 28 10/27/2018   TSH 0.71 10/27/2018   HGBA1C 6.1 10/27/2018        Assessment & Plan:

## 2018-10-27 NOTE — Patient Instructions (Signed)
Please continue all other medications as before, and refills have been done if requested - the ativan  Please have the pharmacy call with any other refills you may need.  Please continue your efforts at being more active, low cholesterol diet, and weight control.  You are otherwise up to date with prevention measures today.  Please keep your appointments with your specialists as you may have planned  Please go to the LAB in the Basement (turn left off the elevator) for the tests to be done today  You will be contacted by phone if any changes need to be made immediately.  Otherwise, you will receive a letter about your results with an explanation, but please check with MyChart first.  Please remember to sign up for MyChart if you have not done so, as this will be important to you in the future with finding out test results, communicating by private email, and scheduling acute appointments online when needed.  Please return in 1 year for your yearly visit, or sooner if needed, with Lab testing done 3-5 days before

## 2018-10-27 NOTE — Assessment & Plan Note (Signed)

## 2018-10-27 NOTE — Assessment & Plan Note (Signed)
stable overall by history and exam, recent data reviewed with pt, and pt to continue medical treatment as before,  to f/u any worsening symptoms or concerns  

## 2018-10-27 NOTE — Assessment & Plan Note (Signed)
Ok to continue ativan prn

## 2018-10-28 ENCOUNTER — Encounter: Payer: Self-pay | Admitting: Internal Medicine

## 2018-12-01 NOTE — Progress Notes (Signed)
Cardiology Office Note:    Date:  12/02/2018   ID:  Meagan Holmes, DOB [birth year], MRN [id number]  PCP:  Biagio Borg, MD  Cardiologist:  Ena Dawley, MD  Electrophysiologist:  None   Referring MD: Lance Sell, NP   Chief Complaint  Patient presents with  . Follow-up    Carotid artery Dz; HLP    History of Present Illness:    Meagan Holmes is a 78 y.o. female with :  Carotid artery Dz  Hx of LICA 29-52 >> regressed to 1-39 (08/2016)  Coronary Calcification by Chest CT 5/19  Aortic atherosclerosis noted on CT  Hyperlipidemia   Tobacco use  COPD  FHx CAD  Post surgical Hypothryoidism  Hx of hyperthyroidism    Meagan Holmes returns for follow up.  She is here alone. She remains quite active. She does all of her housework.  She also exercises every night.  She has not had chest pain, shortness of breath, syncope, paroxysmal nocturnal dyspnea, leg swelling.  She has not had claudications symptoms.  She has started smoking again.   Prior CV studies:   The following studies were reviewed today:  Carotid US 10/01/17 Bilat ICA 1-39  Echocardiogram 09/10/17 EF 60-65, no RWMA, Gr 2 DD  Chest CT 08/29/17 IMPRESSION: 1. Lung-RADS 1, negative. Continue annual screening with low-dose chest CT without contrast in 12 months. 2. Aortic Atherosclerosis (ICD10-I70.0) and Emphysema (ICD10-J43.9). 3. Lad and left circumflex coronary artery calcifications. 4. Gallstone.   Past Medical History:  Diagnosis Date  . Allergic rhinitis    a lot better after septoplasty- remote  . Anxiety and depression 02/10/2007  . COPD (chronic obstructive pulmonary disease) ([place]) 10/02/2010  . Hematuria 2004   (-) CT abd-pelvis (-) cystoscopy  . Hypothyroidism   . Osteopenia    Surgical Hx: The patient  has a past surgical history that includes Appendectomy; Thyroidectomy; Carpal tunnel release; and Nasal septum surgery.   Current Medications: Current Meds  Medication Sig   . aspirin 81 MG tablet Take 81 mg by mouth daily.  [name] atorvastatin (LIPITOR) 80 MG tablet Take 1 tablet (80 mg total) by mouth daily at 6 PM. Pt must keep upcoming appt in Aug to get further refills  . calcium gluconate 500 MG tablet Take 500 mg by mouth daily.    . Cholecalciferol (VITAMIN D3 PO) Take 2,000 Units by mouth daily.   [name] levocetirizine (XYZAL) 5 MG tablet TAKE 1 TABLET BY MOUTH EVERY DAY IN THE EVENING  . LORazepam (ATIVAN) 0.5 MG tablet TAKE 1 TABLET BY MOUTH TWICE A DAY AS NEEDED FOR ANXIETY  . SYNTHROID 88 MCG tablet TAKE 1 TABLET (88 MCG TOTAL) BY MOUTH DAILY BEFORE BREAKFAST.     Allergies:   Amoxil [amoxicillin]   Social History   Tobacco Use  . Smoking status: Current Every Day Smoker    Packs/day: 0.75    Years: 58.00    Pack years: 43.50    Types: Cigarettes    Start date: 04/29/2012  . Smokeless tobacco: Never Used  . Tobacco comment: 1/2 ppd  Substance Use Topics  . Alcohol use: No    Alcohol/week: 0.0 standard drinks  . Drug use: No     Family Hx: The patient's family history includes Colon cancer in an other family member; Diabetes in her mother; Heart attack in her father; Hypertension in her mother. There is no history of Breast cancer, Cancer, Heart disease, or Stroke.  ROS:   Please  see the history of present illness.    ROS All other systems reviewed and are negative.   EKGs/Labs/Other Test Reviewed:    EKG:  EKG is  ordered today.  The ekg ordered today demonstrates normal sinus rhythm, HR 67, normal axis, PACs, QTc 441, no changes   Recent Labs: 10/27/2018: ALT 21; BUN 13; Creatinine, Ser 0.76; Hemoglobin 14.2; Platelets 222.0; Potassium 4.4; Sodium 139; TSH 0.71   Recent Lipid Panel Lab Results  Component Value Date/Time   CHOL 115 10/27/2018 02:55 PM   CHOL 110 09/10/2017 11:42 AM   TRIG 106.0 10/27/2018 02:55 PM   TRIG 83 02/25/2006 01:12 PM   HDL 40.20 10/27/2018 02:55 PM   HDL 44 09/10/2017 11:42 AM   CHOLHDL 3 10/27/2018 02:55 PM    LDLCALC 53 10/27/2018 02:55 PM   LDLCALC 53 09/10/2017 11:42 AM   LDLDIRECT 171.2 03/19/2012 11:29 AM    Physical Exam:    VS:  BP 122/60   Pulse 67   Ht 5\' 3"  (1.6 m)   Wt 150 lb (68 kg)   SpO2 95%   BMI 26.57 kg/m     Wt Readings from Last 3 Encounters:  12/02/18 150 lb (68 kg)  10/27/18 151 lb (68.5 kg)  07/09/18 153 lb (69.4 kg)     Physical Exam  Constitutional: She is oriented to person, place, and time. She appears well-developed and well-nourished. No distress.  HENT:  Head: Normocephalic and atraumatic.  Eyes: No scleral icterus.  Neck: Neck supple. No JVD present. No thyromegaly present.  Cardiovascular: Normal rate, regular rhythm, S1 normal, S2 normal and normal heart sounds.  No murmur heard. Pulses:      Dorsalis pedis pulses are 2+ on the right side and 2+ on the left side.       Posterior tibial pulses are 2+ on the right side and 2+ on the left side.  Pulmonary/Chest: Effort normal. She has no rales.  Abdominal: Soft. There is no hepatomegaly.  Musculoskeletal:        General: No edema.  Lymphadenopathy:    She has no cervical adenopathy.  Neurological: She is alert and oriented to person, place, and time.  Skin: Skin is warm and dry.  Psychiatric: She has a normal mood and affect.    ASSESSMENT & PLAN:    1. Calcification of coronary artery No anginal symptoms.  Continue ASA, statin.   2. Aortic atherosclerosis (HCC) Continue ASA, statin.   3. Bilateral carotid artery disease, unspecified type ([place]) She has had just mild plaque the last 2 years.  Arrange repeat Carotid US.  If there is no change, she will just need prn follow up going forward.    4. Mixed hyperlipidemia LDL optimal on most recent lab work.  Continue current Rx.    5. Tobacco abuse She has started smoking again.  I have recommended that she quit.      Dispo:  Return in about 1 year (around 12/02/2019) for Routine Follow Up w/ Dr. [name], or PA/NP.   Medication  Adjustments/Labs and Tests Ordered: Current medicines are reviewed at length with the patient today.  Concerns regarding medicines are outlined above.  Tests Ordered: Orders Placed This Encounter  Procedures  . EKG 12-Lead   Medication Changes: No orders of the defined types were placed in this encounter.   Signed, Richardson Dopp, PA-C  12/02/2018 12:34 PM    [facility] [place], [place], [place]  [address] Phone: [phone])  [phone]; Fax: [phone]    

## 2018-12-02 ENCOUNTER — Encounter (INDEPENDENT_AMBULATORY_CARE_PROVIDER_SITE_OTHER): Payer: Self-pay

## 2018-12-02 ENCOUNTER — Other Ambulatory Visit: Payer: Self-pay

## 2018-12-02 ENCOUNTER — Other Ambulatory Visit: Payer: Self-pay | Admitting: Physician Assistant

## 2018-12-02 ENCOUNTER — Ambulatory Visit (INDEPENDENT_AMBULATORY_CARE_PROVIDER_SITE_OTHER): Payer: Medicare Other | Admitting: Physician Assistant

## 2018-12-02 ENCOUNTER — Encounter: Payer: Self-pay | Admitting: Physician Assistant

## 2018-12-02 VITALS — BP 122/60 | HR 67 | Ht 63.0 in | Wt 150.0 lb

## 2018-12-02 DIAGNOSIS — I739 Peripheral vascular disease, unspecified: Secondary | ICD-10-CM

## 2018-12-02 DIAGNOSIS — Z720 Tobacco use: Secondary | ICD-10-CM

## 2018-12-02 DIAGNOSIS — I2510 Atherosclerotic heart disease of native coronary artery without angina pectoris: Secondary | ICD-10-CM | POA: Diagnosis not present

## 2018-12-02 DIAGNOSIS — I700 Atherosclerosis of aorta: Secondary | ICD-10-CM

## 2018-12-02 DIAGNOSIS — E782 Mixed hyperlipidemia: Secondary | ICD-10-CM | POA: Diagnosis not present

## 2018-12-02 DIAGNOSIS — I779 Disorder of arteries and arterioles, unspecified: Secondary | ICD-10-CM

## 2018-12-02 DIAGNOSIS — I2584 Coronary atherosclerosis due to calcified coronary lesion: Secondary | ICD-10-CM

## 2018-12-02 NOTE — Patient Instructions (Signed)
Medication Instructions:  Your physician recommends that you continue on your current medications as directed. Please refer to the Current Medication list given to you today.  If you need a refill on your cardiac medications before your next appointment, please call your pharmacy.   Lab work: -None If you have labs (blood work) drawn today and your tests are completely normal, you will receive your results only by: [name] MyChart Message (if you have MyChart) OR . A paper copy in the mail If you have any lab test that is abnormal or we need to change your treatment, we will call you to review the results.  Testing/Procedures: Your physician has requested that you have a carotid duplex. This test is an ultrasound of the carotid arteries in your neck. It looks at blood flow through these arteries that supply the brain with blood. Allow one hour for this exam. There are no restrictions or special instructions.    Follow-Up: At [facility], you and your health needs are our priority.  As part of our continuing mission to provide you with exceptional heart care, we have created designated Provider Care Teams.  These Care Teams include your primary Cardiologist (physician) and Advanced Practice Providers (APPs -  Physician Assistants and Nurse Practitioners) who all work together to provide you with the care you need, when you need it. You will need a follow up appointment in 1 years.  Please call our office 2 months in advance to schedule this appointment.  You may see Ena Dawley, MD or one of the following Advanced Practice Providers on your designated Care Team:   [place], PA-C Melina Copa, PA-C . Ermalinda Barrios, PA-C  Any Other Special Instructions Will Be Listed Below (If Applicable).

## 2018-12-09 ENCOUNTER — Ambulatory Visit (HOSPITAL_COMMUNITY)
Admission: RE | Admit: 2018-12-09 | Discharge: 2018-12-09 | Disposition: A | Discharge status: Home / Self Care | Payer: Medicare Other | Source: Ambulatory Visit | Attending: Cardiology | Admitting: Cardiology

## 2018-12-09 ENCOUNTER — Other Ambulatory Visit: Payer: Self-pay

## 2018-12-09 DIAGNOSIS — I739 Peripheral vascular disease, unspecified: Secondary | ICD-10-CM | POA: Diagnosis not present

## 2018-12-09 DIAGNOSIS — I779 Disorder of arteries and arterioles, unspecified: Secondary | ICD-10-CM

## 2018-12-11 ENCOUNTER — Encounter: Payer: Self-pay | Admitting: Physician Assistant

## 2019-01-13 ENCOUNTER — Other Ambulatory Visit: Payer: Self-pay | Admitting: Physician Assistant

## 2019-02-05 ENCOUNTER — Encounter

## 2019-02-11 ENCOUNTER — Ambulatory Visit: Payer: Self-pay

## 2019-02-11 NOTE — Telephone Encounter (Signed)
Pt had shingle shot on Tuesday and on Wednesday she felt fatigue and just laid around that is not normal for her. Pt also has loss of appetite and headache on Wednesday. Pt still does not have a fever nor appetite    Pt called back and stated she had had the shingles shot on Tuesday and started feeling fatigue on Wednesday.  She now has loss of appetite, headache, has had chills and muscular pain. She fees better today. When advised that she should be tested for covid-19. She stated her daughter goes to [employer] and one of the teachers there had a family member that was sick last week. Now in quarantine. Not everyone is wearing a mask at the school. Information given to her regarding where to get tested. She voiced understanding. She is also advised to take her daughter and they should both quarantine until at least her result comes back negative. She voiced understanding Advised of s/s of covid and to call 911 for respiratory distress.  Routing to [facility] at [place] for review..  Reason for Disposition . [1] COVID-19 infection suspected by caller or triager AND [2] mild symptoms (cough, fever, or others) AND [id number] no complications or SOB  Answer Assessment - Initial Assessment Questions 1. COVID-19 DIAGNOSIS: "Who made your Coronavirus (COVID-19) diagnosis?" "Was it confirmed by a positive lab test?" If not diagnosed by a HCP, ask "Are there lots of cases (community spread) where you live?" (See public health department website, if unsure)     In the community 2. ONSET: "When did the COVID-19 symptoms start?"      Wednesday 3. WORST SYMPTOM: "What is your worst symptom?" (e.g., cough, fever, shortness of breath, muscle aches)     No appetite 4. COUGH: "Do you have a cough?" If so, ask: "How bad is the cough?"       no 5. FEVER: "Do you have a fever?" If so, ask: "What is your temperature, how was it measured, and when did it start?"     Not sure 6. RESPIRATORY STATUS: "Describe  your breathing?" (e.g., shortness of breath, wheezing, unable to speak)      normal 7. BETTER-SAME-WORSE: "Are you getting better, staying the same or getting worse compared to yesterday?"  If getting worse, ask, "In what way?"     About the same 8. HIGH RISK DISEASE: "Do you have any chronic medical problems?" (e.g., asthma, heart or lung disease, weak immune system, etc.)     no 9. PREGNANCY: "Is there any chance you are pregnant?" "When was your last menstrual period?"     n/a 10. OTHER SYMPTOMS: "Do you have any other symptoms?"  (e.g., chills, fatigue, headache, loss of smell or taste, muscle pain, sore throat)       Headache, chills after the shot for shingles, and also muscular pains after shot  Protocols used: CORONAVIRUS (COVID-19) DIAGNOSED OR SUSPECTED-A-AH

## 2019-02-11 NOTE — Telephone Encounter (Signed)
OK to continue to monitor her non specific symptoms for any worsening specific problems, thanks

## 2019-02-12 NOTE — Telephone Encounter (Signed)
Called pt, LVM.   

## 2019-04-19 ENCOUNTER — Other Ambulatory Visit: Payer: Self-pay | Admitting: *Deleted

## 2019-04-19 DIAGNOSIS — E039 Hypothyroidism, unspecified: Secondary | ICD-10-CM

## 2019-04-19 DIAGNOSIS — J309 Allergic rhinitis, unspecified: Secondary | ICD-10-CM

## 2019-04-19 MED ORDER — LEVOTHYROXINE SODIUM 88 MCG PO TABS: 88.0000 ug | ORAL_TABLET | Freq: Every day | ORAL | 0 refills | Status: DC

## 2019-04-19 MED ORDER — LEVOCETIRIZINE DIHYDROCHLORIDE 5 MG PO TABS: ORAL_TABLET | ORAL | 1 refills | Status: DC

## 2019-06-01 DIAGNOSIS — H40013 Open angle with borderline findings, low risk, bilateral: Secondary | ICD-10-CM | POA: Diagnosis not present

## 2019-06-01 DIAGNOSIS — H25043 Posterior subcapsular polar age-related cataract, bilateral: Secondary | ICD-10-CM | POA: Diagnosis not present

## 2019-06-01 DIAGNOSIS — H25013 Cortical age-related cataract, bilateral: Secondary | ICD-10-CM | POA: Diagnosis not present

## 2019-06-01 DIAGNOSIS — H2513 Age-related nuclear cataract, bilateral: Secondary | ICD-10-CM | POA: Diagnosis not present

## 2019-07-21 ENCOUNTER — Encounter: Payer: Self-pay | Admitting: Internal Medicine

## 2019-07-21 ENCOUNTER — Ambulatory Visit (INDEPENDENT_AMBULATORY_CARE_PROVIDER_SITE_OTHER): Payer: Medicare Other | Admitting: Internal Medicine

## 2019-07-21 ENCOUNTER — Other Ambulatory Visit: Payer: Self-pay

## 2019-07-21 DIAGNOSIS — J410 Simple chronic bronchitis: Secondary | ICD-10-CM | POA: Diagnosis not present

## 2019-07-21 DIAGNOSIS — E559 Vitamin D deficiency, unspecified: Secondary | ICD-10-CM

## 2019-07-21 DIAGNOSIS — R059 Cough, unspecified: Secondary | ICD-10-CM

## 2019-07-21 DIAGNOSIS — E611 Iron deficiency: Secondary | ICD-10-CM

## 2019-07-21 DIAGNOSIS — Z0001 Encounter for general adult medical examination with abnormal findings: Secondary | ICD-10-CM | POA: Diagnosis not present

## 2019-07-21 DIAGNOSIS — E538 Deficiency of other specified B group vitamins: Secondary | ICD-10-CM | POA: Diagnosis not present

## 2019-07-21 DIAGNOSIS — R05 Cough: Secondary | ICD-10-CM

## 2019-07-21 DIAGNOSIS — R7303 Prediabetes: Secondary | ICD-10-CM | POA: Diagnosis not present

## 2019-07-21 DIAGNOSIS — Z0000 Encounter for general adult medical examination without abnormal findings: Secondary | ICD-10-CM

## 2019-07-21 DIAGNOSIS — E039 Hypothyroidism, unspecified: Secondary | ICD-10-CM

## 2019-07-21 MED ORDER — HYDROCODONE-HOMATROPINE 5-1.5 MG/5ML PO SYRP: 5.0000 mL | ORAL_SOLUTION | Freq: Four times a day (QID) | ORAL | 0 refills | Status: AC | PRN

## 2019-07-21 MED ORDER — AZITHROMYCIN 250 MG PO TABS: ORAL_TABLET | ORAL | 1 refills | Status: DC

## 2019-07-21 NOTE — Assessment & Plan Note (Signed)
For a1c, stable overall by history and exam, recent data reviewed with pt, and pt to continue medical treatment as before,  to f/u any worsening symptoms or concerns

## 2019-07-21 NOTE — Patient Instructions (Signed)
Please take all new medication as prescribed - the antibiotic and cough medicine  Please continue all other medications as before, and refills have been done if requested.  Please have the pharmacy call with any other refills you may need.  Please continue your efforts at being more active, low cholesterol diet, and weight control.  You are otherwise up to date with prevention measures today.  Please keep your appointments with your specialists as you may have planned  Please go to the LAB at the blood drawing area for the tests to be done  You will be contacted by phone if any changes need to be made immediately.  Otherwise, you will receive a letter about your results with an explanation, but please check with MyChart first.  Please remember to sign up for MyChart if you have not done so, as this will be important to you in the future with finding out test results, communicating by private email, and scheduling acute appointments online when needed.  Please make an Appointment to return in 6 months, or sooner if needed

## 2019-07-21 NOTE — Progress Notes (Signed)
Patient ID: Meagan Holmes, female   DOB: 1941/01/02, 79 y.o.   MRN: [id number]  Virtual Visit via Video Note  I connected with Meagan Holmes on 07/21/19 at  3:40 PM EDT by a video enabled telemedicine application and verified that I am speaking with the correct person using two identifiers.  Location: Patient: at home Provider: at office   I discussed the limitations of evaluation and management by telemedicine and the availability of in person appointments. The patient expressed understanding and agreed to proceed.  History of Present Illness: Here for wellness and f/u;  Overall doing ok;  Pt denies Chest pain, worsening SOB, DOE, wheezing, orthopnea, PND, worsening LE edema, palpitations, dizziness or syncope, except for Here with acute onset mild to mod 2-3 days ST, HA, general weakness and malaise, with prod cough greenish sputum, but Pt denies chest pain, increased sob or doe, wheezing, orthopnea, PND, increased LE swelling, palpitations, dizziness or syncope..  Pt denies neurological change such as new headache, facial or extremity weakness.  Pt denies polydipsia, polyuria, or low sugar symptoms. Pt states overall good compliance with treatment and medications, good tolerability, and has been trying to follow appropriate diet.  Pt denies worsening depressive symptoms, suicidal ideation or panic. No fever, night sweats, wt loss, loss of appetite, or other constitutional symptoms.  Pt states good ability with ADL's, has low fall risk, home safety reviewed and adequate, no other significant changes in hearing or vision, and only occasionally active with exercise. S/p covid vaccine x 2.   Past Medical History:  Diagnosis Date  . Allergic rhinitis    a lot better after septoplasty- remote  . Anxiety and depression 02/10/2007  . Carotid artery disease ([place]) 05/21/2014   Carotid US 11/2018: Bilateral ICA 1-39  . COPD (chronic obstructive pulmonary disease) ([place]) 10/02/2010  . Hematuria 2004   (-)  CT abd-pelvis (-) cystoscopy  . Hypothyroidism   . Osteopenia    Past Surgical History:  Procedure Laterality Date  . APPENDECTOMY    . CARPAL TUNNEL RELEASE    . NASAL SEPTUM SURGERY    . THYROIDECTOMY      reports that she has been smoking cigarettes. She started smoking about 7 years ago. She has a 43.50 pack-year smoking history. She has never used smokeless tobacco. She reports that she does not drink alcohol or use drugs. family history includes Colon cancer in an other family member; Diabetes in her mother; Heart attack in her father; Hypertension in her mother. Allergies  Allergen Reactions  . Amoxil [Amoxicillin] Hives   Current Outpatient Medications on File Prior to Visit  Medication Sig Dispense Refill  . aspirin 81 MG tablet Take 81 mg by mouth daily.    [name] atorvastatin (LIPITOR) 80 MG tablet TAKE 1 TABLET (80 MG TOTAL) BY MOUTH DAILY AT 6 PM. PT MUST KEEP UPCOMING APPT IN AUG TO GET FURTHER REFILLS 90 tablet 3  . calcium gluconate 500 MG tablet Take 500 mg by mouth daily.      . Cholecalciferol (VITAMIN D3 PO) Take 2,000 Units by mouth daily.     [name] levocetirizine (XYZAL) 5 MG tablet TAKE 1 TABLET BY MOUTH EVERY DAY IN THE EVENING 30 tablet 1  . levothyroxine (SYNTHROID) 88 MCG tablet Take 1 tablet (88 mcg total) by mouth daily before breakfast. 90 tablet 0  . LORazepam (ATIVAN) 0.5 MG tablet TAKE 1 TABLET BY MOUTH TWICE A DAY AS NEEDED FOR ANXIETY 60 tablet 1   No current  facility-administered medications on file prior to visit.    Observations/Objective: Alert, NAD, appropriate mood and affect, resps normal, cn 2-12 intact, moves all 4s, no visible rash or swelling Lab Results  Component Value Date   WBC 8.3 10/27/2018   HGB 14.2 10/27/2018   HCT 41.8 10/27/2018   PLT 222.0 10/27/2018   GLUCOSE 90 10/27/2018   CHOL 115 10/27/2018   TRIG 106.0 10/27/2018   HDL 40.20 10/27/2018   LDLDIRECT 171.2 03/19/2012   LDLCALC 53 10/27/2018   ALT 21 10/27/2018   AST 23  10/27/2018   NA 139 10/27/2018   K 4.4 10/27/2018   CL 104 10/27/2018   CREATININE 0.76 10/27/2018   BUN 13 10/27/2018   CO2 28 10/27/2018   TSH 0.71 10/27/2018   HGBA1C 6.1 10/27/2018   Assessment and Plan: See notes  Follow Up Instructions: See ntoes   I discussed the assessment and treatment plan with the patient. The patient was provided an opportunity to ask questions and all were answered. The patient agreed with the plan and demonstrated an understanding of the instructions.   The patient was advised to call back or seek an in-person evaluation if the symptoms worsen or if the condition fails to improve as anticipated.   Cathlean Cower, MD

## 2019-07-21 NOTE — Assessment & Plan Note (Addendum)
Mild to mod, c/w bronchitis vs pna, for antibx course,  Declines cxr, to f/u any worsening symptoms or concerns  I spent 31 minutes in preparing to see the patient by review of recent labs, imaging and procedures, obtaining and reviewing separately obtained history, communicating with the patient and family or caregiver, ordering medications, tests or procedures, and documenting clinical information in the EHR including the differential Dx, treatment, and any further evaluation and other management of cough, hypothyrodism, preDM, copd

## 2019-07-21 NOTE — Assessment & Plan Note (Signed)
stable overall by history and exam, recent data reviewed with pt, and pt to continue medical treatment as before,  to f/u any worsening symptoms or concerns  

## 2019-07-21 NOTE — Assessment & Plan Note (Signed)

## 2019-07-27 ENCOUNTER — Other Ambulatory Visit: Payer: Self-pay | Admitting: Internal Medicine

## 2019-07-27 DIAGNOSIS — E039 Hypothyroidism, unspecified: Secondary | ICD-10-CM

## 2019-07-30 DIAGNOSIS — S99922A Unspecified injury of left foot, initial encounter: Secondary | ICD-10-CM | POA: Diagnosis not present

## 2019-07-30 DIAGNOSIS — S91312A Laceration without foreign body, left foot, initial encounter: Secondary | ICD-10-CM | POA: Diagnosis not present

## 2019-08-20 ENCOUNTER — Encounter: Payer: Self-pay | Admitting: Internal Medicine

## 2019-08-20 ENCOUNTER — Other Ambulatory Visit (INDEPENDENT_AMBULATORY_CARE_PROVIDER_SITE_OTHER): Payer: Medicare Other

## 2019-08-20 DIAGNOSIS — E538 Deficiency of other specified B group vitamins: Secondary | ICD-10-CM | POA: Diagnosis not present

## 2019-08-20 DIAGNOSIS — E559 Vitamin D deficiency, unspecified: Secondary | ICD-10-CM | POA: Diagnosis not present

## 2019-08-20 DIAGNOSIS — R7303 Prediabetes: Secondary | ICD-10-CM | POA: Diagnosis not present

## 2019-08-20 DIAGNOSIS — Z0000 Encounter for general adult medical examination without abnormal findings: Secondary | ICD-10-CM | POA: Diagnosis not present

## 2019-08-20 DIAGNOSIS — E611 Iron deficiency: Secondary | ICD-10-CM | POA: Diagnosis not present

## 2019-08-20 LAB — LIPID PANEL
Cholesterol: 116 mg/dL (ref 0–200)
HDL: 43.2 mg/dL (ref >39.00)
LDL Cholesterol: 62 mg/dL (ref 0–99)
NonHDL: 72.97
Total CHOL/HDL Ratio: 3
Triglycerides: 57 mg/dL (ref 0.0–149.0)
VLDL: 11.4 mg/dL (ref 0.0–40.0)

## 2019-08-20 LAB — URINALYSIS, ROUTINE W REFLEX MICROSCOPIC
Bilirubin Urine: NEGATIVE
Ketones, ur: NEGATIVE
Leukocytes,Ua: NEGATIVE
Nitrite: NEGATIVE
Specific Gravity, Urine: 1.015 (ref 1.000–1.030)
Total Protein, Urine: NEGATIVE
Urine Glucose: NEGATIVE
Urobilinogen, UA: 0.2 (ref 0.0–1.0)
pH: 6 (ref 5.0–8.0)

## 2019-08-20 LAB — IBC PANEL
Iron: 67 ug/dL (ref 42–145)
Saturation Ratios: 20.4 % (ref 20.0–50.0)
Transferrin: 235 mg/dL (ref 212.0–360.0)

## 2019-08-20 LAB — CBC WITH DIFFERENTIAL/PLATELET
Basophils Absolute: 0.1 10*3/uL (ref 0.0–0.1)
Basophils Relative: 0.9 % (ref 0.0–3.0)
Eosinophils Absolute: 0.3 10*3/uL (ref 0.0–0.7)
Eosinophils Relative: 3.5 % (ref 0.0–5.0)
HCT: 39.7 % (ref 36.0–46.0)
Hemoglobin: 13.5 g/dL (ref 12.0–15.0)
Lymphocytes Relative: 27.8 % (ref 12.0–46.0)
Lymphs Abs: 2.2 10*3/uL (ref 0.7–4.0)
MCHC: 33.9 g/dL (ref 30.0–36.0)
MCV: 91.7 fl (ref 78.0–100.0)
Monocytes Absolute: 0.6 10*3/uL (ref 0.1–1.0)
Monocytes Relative: 8 % (ref 3.0–12.0)
Neutro Abs: 4.7 10*3/uL (ref 1.4–7.7)
Neutrophils Relative %: 59.8 % (ref 43.0–77.0)
Platelets: 232 10*3/uL (ref 150.0–400.0)
RBC: 4.33 Mil/uL (ref 3.87–5.11)
RDW: 14.3 % (ref 11.5–15.5)
WBC: 7.9 10*3/uL (ref 4.0–10.5)

## 2019-08-20 LAB — HEPATIC FUNCTION PANEL
ALT: 19 U/L (ref 0–35)
AST: 19 U/L (ref 0–37)
Albumin: 4.2 g/dL (ref 3.5–5.2)
Alkaline Phosphatase: 41 U/L (ref 39–117)
Bilirubin, Direct: 0.1 mg/dL (ref 0.0–0.3)
Total Bilirubin: 0.3 mg/dL (ref 0.2–1.2)
Total Protein: 7.2 g/dL (ref 6.0–8.3)

## 2019-08-20 LAB — BASIC METABOLIC PANEL
BUN: 11 mg/dL (ref 6–23)
CO2: 31 mEq/L (ref 19–32)
Calcium: 9.5 mg/dL (ref 8.4–10.5)
Chloride: 105 mEq/L (ref 96–112)
Creatinine, Ser: 0.73 mg/dL (ref 0.40–1.20)
GFR: 76.87 mL/min (ref >60.00)
Glucose, Bld: 108 mg/dL — ABNORMAL HIGH (ref 70–99)
Potassium: 4 mEq/L (ref 3.5–5.1)
Sodium: 142 mEq/L (ref 135–145)

## 2019-08-20 LAB — HEMOGLOBIN A1C: Hgb A1c MFr Bld: 6.1 % (ref 4.6–6.5)

## 2019-08-20 LAB — VITAMIN B12: Vitamin B-12: 1482 pg/mL — ABNORMAL HIGH (ref 211–911)

## 2019-08-20 LAB — TSH: TSH: 0.82 u[IU]/mL (ref 0.35–4.50)

## 2019-08-20 LAB — VITAMIN D 25 HYDROXY (VIT D DEFICIENCY, FRACTURES): VITD: 83.54 ng/mL (ref 30.00–100.00)

## 2019-08-25 DIAGNOSIS — S91312S Laceration without foreign body, left foot, sequela: Secondary | ICD-10-CM | POA: Diagnosis not present

## 2019-10-29 DIAGNOSIS — H6121 Impacted cerumen, right ear: Secondary | ICD-10-CM | POA: Diagnosis not present

## 2019-10-29 DIAGNOSIS — J069 Acute upper respiratory infection, unspecified: Secondary | ICD-10-CM | POA: Diagnosis not present

## 2019-11-08 DIAGNOSIS — L821 Other seborrheic keratosis: Secondary | ICD-10-CM | POA: Diagnosis not present

## 2019-11-08 DIAGNOSIS — L84 Corns and callosities: Secondary | ICD-10-CM | POA: Diagnosis not present

## 2019-11-08 DIAGNOSIS — L812 Freckles: Secondary | ICD-10-CM | POA: Diagnosis not present

## 2019-11-09 ENCOUNTER — Telehealth: Payer: Self-pay | Admitting: Internal Medicine

## 2019-11-09 NOTE — Telephone Encounter (Signed)
New message:   Pt is calling and states she never received a call about her most recent lab results. She states she never received a letter either. Pt states she would mostly like to know about her thyroid results. Please advise.

## 2019-11-10 NOTE — Telephone Encounter (Signed)
Called pt, who states she found the letter she was looking for.

## 2019-12-08 NOTE — Progress Notes (Signed)
Cardiology Office Note    Date:  12/14/2019   ID:  Meagan Holmes, DOB August 16, 1940, MRN [id number]  PCP:  Biagio Borg, MD  Cardiologist: Ena Dawley, MD EPS: None  Chief Complaint  Patient presents with  . Follow-up    History of Present Illness:  Meagan Holmes is a 79 y.o. female with history of coronary calcification on CT 08/2017, carotid artery disease with LICA 60 to 66% regressed down to 1 to 39% 2018 and stable 2020, hyperlipidemia, tobacco abuse.  Richardson Dopp, PA-C 11/2018 and had started smoking again.  Patient comes in for yearly f/u. Denies chest pain, dyspnea, dizziness, edema. Does stretching and arm exercises for 20 min daily. No regular walking. Smoking 1/2 ppd. She's tried everything to stop. Raising her 37 yr old great granddaughter who she adopted. Helping to raise her other great granddaughter as well. Under a lot of stress.   Past Medical History:  Diagnosis Date  . Allergic rhinitis    a lot better after septoplasty- remote  . Allergic rhinitis 02/10/2007   Qualifier: Diagnosis of  By: Larose Kells MD, [place] Anxiety and depression 02/10/2007  . Carotid artery disease ([place]) 05/21/2014   Carotid US 11/2018: Bilateral ICA 1-39  . COPD (chronic obstructive pulmonary disease) ([place]) 10/02/2010  . Family history of early CAD 09/02/2017  . Heart murmur 07/06/2014  . Hematuria 2004   (-) CT abd-pelvis (-) cystoscopy  . Hyperlipidemia 02/17/2009   Qualifier: Diagnosis of  By: Larose Kells MD, [place] Hypothyroidism   . 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  . Osteopenia   . Osteoporosis 10/02/2010    DEXA  2008 T score -2.1 DEXA 02-2009 Tscore - 3.3 Started fosamax 2009, switched to later to actonel to increase compliance   . Pre-diabetes 12/17/2017  . Triceps tendonitis 12/14/2015    Past Surgical History:  Procedure Laterality Date  . APPENDECTOMY    . CARPAL TUNNEL RELEASE    . NASAL SEPTUM SURGERY    . THYROIDECTOMY      Current  Medications: Current Meds  Medication Sig  . aspirin 81 MG tablet Take 81 mg by mouth daily.  [name] atorvastatin (LIPITOR) 80 MG tablet TAKE 1 TABLET (80 MG TOTAL) BY MOUTH DAILY AT 6 PM. PT MUST KEEP UPCOMING APPT IN AUG TO GET FURTHER REFILLS  . azithromycin (ZITHROMAX Z-PAK) 250 MG tablet 2 tab by mouth day 1, then 1 per day  . calcium gluconate 500 MG tablet Take 500 mg by mouth daily.    . Cholecalciferol (VITAMIN D3 PO) Take 2,000 Units by mouth daily.   [name] levocetirizine (XYZAL) 5 MG tablet TAKE 1 TABLET BY MOUTH EVERY DAY IN THE EVENING  . LORazepam (ATIVAN) 0.5 MG tablet TAKE 1 TABLET BY MOUTH TWICE A DAY AS NEEDED FOR ANXIETY  . SYNTHROID 88 MCG tablet TAKE 1 TABLET (88 MCG TOTAL) BY MOUTH DAILY BEFORE BREAKFAST.     Allergies:   Amoxil [amoxicillin]   Social History   Socioeconomic History  . Marital status: Married    Spouse name: Not on file  . Number of children: 3  . Years of education: 84  . Highest education level: Not on file  Occupational History  . Occupation: retired  Tobacco Use  . Smoking status: Current Every Day Smoker    Packs/day: 0.75    Years: 58.00    Pack years: 43.50    Types: Cigarettes    Start date:  04/29/2012  . Smokeless tobacco: Never Used  . Tobacco comment: 1/2 ppd  Substance and Sexual Activity  . Alcohol use: No    Alcohol/week: 0.0 standard drinks  . Drug use: No  . Sexual activity: Not on file  Other Topics Concern  . Not on file  Social History Narrative   Lost son (to drugs) 2008     Husband ill, she takes care of him, raising her g-daughter    Born and raised in [place], [place]. Currently resides in a house with her husband and greatgrandaughter. Maltese dog. Fun: play games with family, gardening, gym   Denies religious beliefs that would effect health care.             Social Determinants of Health   Financial Resource Strain:   . Difficulty of Paying Living Expenses:   Food Insecurity:   . Worried About Charity fundraiser  in the Last Year:   . Arboriculturist in the Last Year:   Transportation Needs:   . Film/video editor (Medical):   [name] Lack of Transportation (Non-Medical):   Physical Activity:   . Days of Exercise per Week:   . Minutes of Exercise per Session:   Stress:   . Feeling of Stress :   Social Connections:   . Frequency of Communication with Friends and Family:   . Frequency of Social Gatherings with Friends and Family:   . Attends Religious Services:   . Active Member of Clubs or Organizations:   . Attends Archivist Meetings:   [name] Marital Status:      Family History:  The patient's   family history includes Colon cancer in an other family member; Diabetes in her mother; Heart attack in her father; Hypertension in her mother.   ROS:   Please see the history of present illness.    ROS All other systems reviewed and are negative.   PHYSICAL EXAM:   VS:  BP 126/62   Pulse 60   Ht 5\' 3"  (1.6 m)   Wt 152 lb 9.6 oz (69.2 kg)   SpO2 96%   BMI 27.03 kg/m   Physical Exam  GEN: Well nourished, well developed, in no acute distress  Neck: no JVD, carotid bruits, or masses Cardiac:RRR; no murmurs, rubs, or gallops  Respiratory:  clear to auscultation bilaterally, normal work of breathing GI: soft, nontender, nondistended, + BS Ext: without cyanosis, clubbing, or edema, Good distal pulses bilaterally Neuro:  Alert and Oriented x 3, Psych: euthymic mood, full affect  Wt Readings from Last 3 Encounters:  12/14/19 152 lb 9.6 oz (69.2 kg)  12/02/18 150 lb (68 kg)  10/27/18 151 lb (68.5 kg)      Studies/Labs Reviewed:   EKG:  EKG is  ordered today.  The ekg ordered today demonstrates  NSR no acute change  Recent Labs: 08/20/2019: ALT 19; BUN 11; Creatinine, Ser 0.73; Hemoglobin 13.5; Platelets 232.0; Potassium 4.0; Sodium 142; TSH 0.82   Lipid Panel    Component Value Date/Time   CHOL 116 08/20/2019 0948   CHOL 110 09/10/2017 1142   TRIG 57.0 08/20/2019 0948   TRIG  83 02/25/2006 1312   HDL 43.20 08/20/2019 0948   HDL 44 09/10/2017 1142   CHOLHDL 3 08/20/2019 0948   VLDL 11.4 08/20/2019 0948   LDLCALC 62 08/20/2019 0948   LDLCALC 53 09/10/2017 1142   LDLDIRECT 171.2 03/19/2012 1129    Additional studies/ records that were reviewed today include:  Carotid Dopplers 12/09/2018 Summary:  Right Carotid: Velocities in the right ICA are consistent with a 1-39%  stenosis.   Left Carotid: Velocities in the left ICA are consistent with a 1-39%  stenosis.   Vertebrals: Bilateral vertebral arteries demonstrate antegrade flow.  Subclavians: Bilateral subclavian artery flow was disturbed.   *See table(s) above for measurements and observations.      Electronically signed by Kathlyn Sacramento MD on 12/10/2018 at 1:05:12 PM.        ASSESSMENT:    1. Calcification of coronary artery   2. Aortic atherosclerosis ([place])   3. Bilateral carotid artery disease, unspecified type ([place])   4. Hyperlipidemia, unspecified hyperlipidemia type   5. Tobacco abuse      PLAN:  In order of problems listed above:  1. Calcification of coronary artery No anginal symptoms.  Continue ASA, statin. 150 min exercise weekly   2. Aortic atherosclerosis ([facility]) Continue ASA, statin.    3. Bilateral carotid artery disease, unspecified type ([place]) 1-39% 11/2018 -recheck   4. Mixed hyperlipidemia LDL 62, trig 57 07/2019  .     5. Tobacco abuse She is smoking 1/2 ppd.  I have recommended that she quit. Needs CT screening.         Medication Adjustments/Labs and Tests Ordered: Current medicines are reviewed at length with the patient today.  Concerns regarding medicines are outlined above.  Medication changes, Labs and Tests ordered today are listed in the Patient Instructions below. Patient Instructions  Medication Instructions:  Your physician recommends that you continue on your current medications as directed. Please refer to the Current Medication list given to you  today.  *If you need a refill on your cardiac medications before your next appointment, please call your pharmacy*   Lab Work: BMET prior to CT Scan  If you have labs (blood work) drawn today and your tests are completely normal, you will receive your results only by: [name] MyChart Message (if you have MyChart) OR . A paper copy in the mail If you have any lab test that is abnormal or we need to change your treatment, we will call you to review the results.   Testing/Procedures: Your physician has requested that you have a carotid duplex. This test is an ultrasound of the carotid arteries in your neck. It looks at blood flow through these arteries that supply the brain with blood. Allow one hour for this exam. There are no restrictions or special instructions.  Non-Cardiac CT scanning, (CAT scanning), is a noninvasive, special x-ray that produces cross-sectional images of the body using x-rays and a computer. CT scans help physicians diagnose and treat medical conditions. For some CT exams, a contrast material is used to enhance visibility in the area of the body being studied. CT scans provide greater clarity and reveal more details than regular x-ray exams.     Follow-Up: At [facility], you and your health needs are our priority.  As part of our continuing mission to provide you with exceptional heart care, we have created designated Provider Care Teams.  These Care Teams include your primary Cardiologist (physician) and Advanced Practice Providers (APPs -  Physician Assistants and Nurse Practitioners) who all work together to provide you with the care you need, when you need it.  We recommend signing up for the patient portal called "MyChart".  Sign up information is provided on this After Visit Summary.  MyChart is used to connect with patients for Virtual Visits (Telemedicine).  Patients are able  to view lab/test results, encounter notes, upcoming appointments, etc.  Non-urgent messages  can be sent to your provider as well.   To learn more about what you can do with MyChart, go to [email or web address].    Your next appointment:   1 year(s)  The format for your next appointment:   In Person  Provider:   You may see Ena Dawley, MD or one of the following Advanced Practice Providers on your designated Care Team:    Melina Copa, PA-C  Ermalinda Barrios, PA-C    Other Instructions Your provider recommends that you maintain 150 minutes per week of moderate aerobic activity.      Sumner Boast, PA-C  12/14/2019 10:39 AM    [facility] [address], [place], [place]  [address] Phone: [phone]; Fax: [phone]

## 2019-12-14 ENCOUNTER — Ambulatory Visit: Payer: Medicare Other | Admitting: Physician Assistant

## 2019-12-14 ENCOUNTER — Encounter: Payer: Self-pay | Admitting: Physician Assistant

## 2019-12-14 ENCOUNTER — Other Ambulatory Visit: Payer: Self-pay

## 2019-12-14 VITALS — BP 126/62 | HR 60 | Ht 63.0 in | Wt 152.6 lb

## 2019-12-14 DIAGNOSIS — Z720 Tobacco use: Secondary | ICD-10-CM

## 2019-12-14 DIAGNOSIS — E785 Hyperlipidemia, unspecified: Secondary | ICD-10-CM

## 2019-12-14 DIAGNOSIS — I2510 Atherosclerotic heart disease of native coronary artery without angina pectoris: Secondary | ICD-10-CM | POA: Diagnosis not present

## 2019-12-14 DIAGNOSIS — I779 Disorder of arteries and arterioles, unspecified: Secondary | ICD-10-CM | POA: Diagnosis not present

## 2019-12-14 DIAGNOSIS — I2584 Coronary atherosclerosis due to calcified coronary lesion: Secondary | ICD-10-CM

## 2019-12-14 DIAGNOSIS — I700 Atherosclerosis of aorta: Secondary | ICD-10-CM | POA: Diagnosis not present

## 2019-12-14 NOTE — Patient Instructions (Signed)
Medication Instructions:  Your physician recommends that you continue on your current medications as directed. Please refer to the Current Medication list given to you today.  *If you need a refill on your cardiac medications before your next appointment, please call your pharmacy*   Lab Work: BMET prior to CT Scan  If you have labs (blood work) drawn today and your tests are completely normal, you will receive your results only by: [name] MyChart Message (if you have MyChart) OR . A paper copy in the mail If you have any lab test that is abnormal or we need to change your treatment, we will call you to review the results.   Testing/Procedures: Your physician has requested that you have a carotid duplex. This test is an ultrasound of the carotid arteries in your neck. It looks at blood flow through these arteries that supply the brain with blood. Allow one hour for this exam. There are no restrictions or special instructions.  Non-Cardiac CT scanning, (CAT scanning), is a noninvasive, special x-ray that produces cross-sectional images of the body using x-rays and a computer. CT scans help physicians diagnose and treat medical conditions. For some CT exams, a contrast material is used to enhance visibility in the area of the body being studied. CT scans provide greater clarity and reveal more details than regular x-ray exams.     Follow-Up: At [facility], you and your health needs are our priority.  As part of our continuing mission to provide you with exceptional heart care, we have created designated Provider Care Teams.  These Care Teams include your primary Cardiologist (physician) and Advanced Practice Providers (APPs -  Physician Assistants and Nurse Practitioners) who all work together to provide you with the care you need, when you need it.  We recommend signing up for the patient portal called "MyChart".  Sign up information is provided on this After Visit Summary.  MyChart is used  to connect with patients for Virtual Visits (Telemedicine).  Patients are able to view lab/test results, encounter notes, upcoming appointments, etc.  Non-urgent messages can be sent to your provider as well.   To learn more about what you can do with MyChart, go to [email or web address].    Your next appointment:   1 year(s)  The format for your next appointment:   In Person  Provider:   You may see Ena Dawley, MD or one of the following Advanced Practice Providers on your designated Care Team:    Melina Copa, PA-C  Ermalinda Barrios, PA-C    Other Instructions Your provider recommends that you maintain 150 minutes per week of moderate aerobic activity.

## 2019-12-14 NOTE — Addendum Note (Signed)
Addended by: Carylon Perches on: 12/14/2019 10:48 AM   Modules accepted: Orders

## 2019-12-21 ENCOUNTER — Ambulatory Visit (INDEPENDENT_AMBULATORY_CARE_PROVIDER_SITE_OTHER)
Admission: RE | Admit: 2019-12-21 | Discharge: 2019-12-21 | Disposition: A | Discharge status: Home / Self Care | Payer: Medicare Other | Source: Ambulatory Visit | Attending: Physician Assistant | Admitting: Physician Assistant

## 2019-12-21 ENCOUNTER — Other Ambulatory Visit: Payer: Self-pay

## 2019-12-21 DIAGNOSIS — Z720 Tobacco use: Secondary | ICD-10-CM

## 2019-12-21 DIAGNOSIS — F17210 Nicotine dependence, cigarettes, uncomplicated: Secondary | ICD-10-CM | POA: Diagnosis not present

## 2019-12-21 DIAGNOSIS — Z87891 Personal history of nicotine dependence: Secondary | ICD-10-CM | POA: Diagnosis not present

## 2019-12-29 ENCOUNTER — Ambulatory Visit (HOSPITAL_COMMUNITY)
Admission: RE | Admit: 2019-12-29 | Discharge: 2019-12-29 | Disposition: A | Discharge status: Home / Self Care | Payer: Medicare Other | Source: Ambulatory Visit | Attending: Cardiovascular Disease | Admitting: Cardiovascular Disease

## 2019-12-29 ENCOUNTER — Other Ambulatory Visit: Payer: Self-pay

## 2019-12-29 DIAGNOSIS — I739 Peripheral vascular disease, unspecified: Secondary | ICD-10-CM | POA: Diagnosis not present

## 2019-12-29 DIAGNOSIS — E785 Hyperlipidemia, unspecified: Secondary | ICD-10-CM

## 2019-12-29 DIAGNOSIS — I779 Disorder of arteries and arterioles, unspecified: Secondary | ICD-10-CM | POA: Diagnosis not present

## 2020-01-12 DIAGNOSIS — Z1231 Encounter for screening mammogram for malignant neoplasm of breast: Secondary | ICD-10-CM | POA: Diagnosis not present

## 2020-01-21 DIAGNOSIS — H25013 Cortical age-related cataract, bilateral: Secondary | ICD-10-CM | POA: Diagnosis not present

## 2020-01-21 DIAGNOSIS — H18413 Arcus senilis, bilateral: Secondary | ICD-10-CM | POA: Diagnosis not present

## 2020-01-21 DIAGNOSIS — H40013 Open angle with borderline findings, low risk, bilateral: Secondary | ICD-10-CM | POA: Diagnosis not present

## 2020-01-21 DIAGNOSIS — H2513 Age-related nuclear cataract, bilateral: Secondary | ICD-10-CM | POA: Diagnosis not present

## 2020-01-24 ENCOUNTER — Other Ambulatory Visit: Payer: Self-pay | Admitting: Physician Assistant

## 2020-02-07 IMAGING — CT CT CHEST LUNG CANCER SCREENING LOW DOSE W/O CM
1 series · 10 of 10 positions shown, 13 images · non-contrast
Comparison: 08/28/2016

CLINICAL DATA: Asymptomatic current smoker. Forty-four pack-year
history.

EXAM:
CT CHEST WITHOUT CONTRAST LOW-DOSE FOR LUNG CANCER SCREENING
TECHNIQUE: Multidetector CT imaging of the chest was performed following the
standard protocol without IV contrast.

[ct lung segmentation data · axial · 0.59mm/px · z∈[-307,-307]mm · 10 of 300 frames shown]
[frame 1/300  mediastinal]
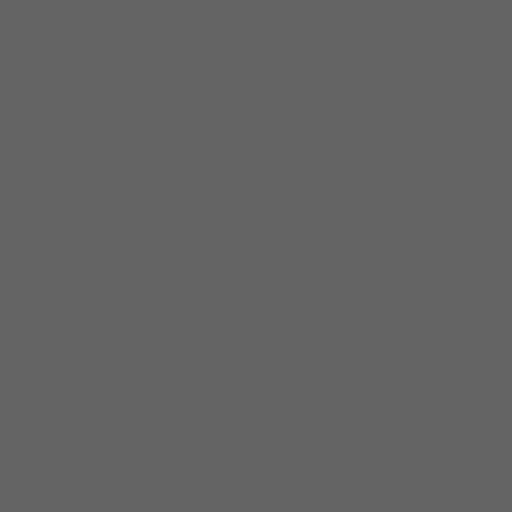
[frame 1/300  lung]
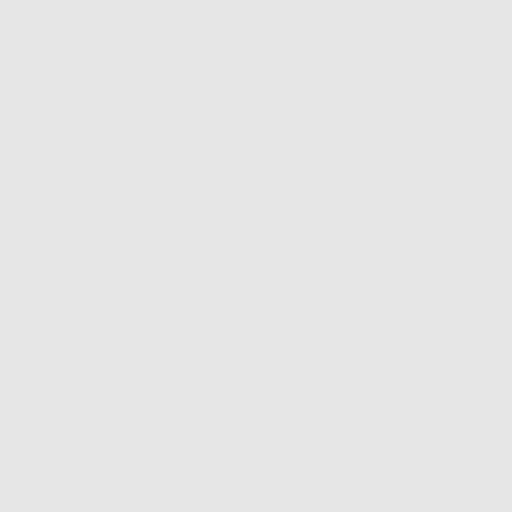
[frame 34/300  lung]
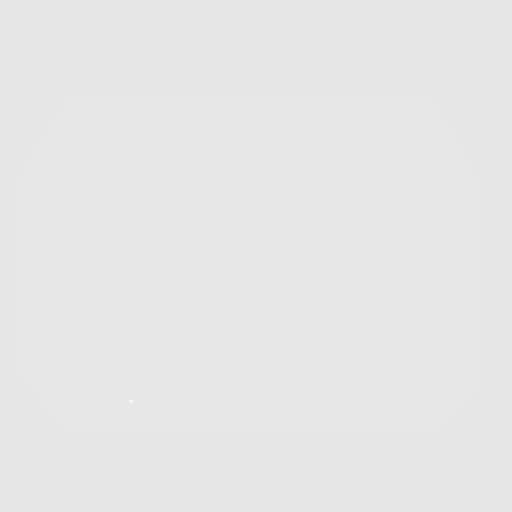
[frame 67/300  lung]
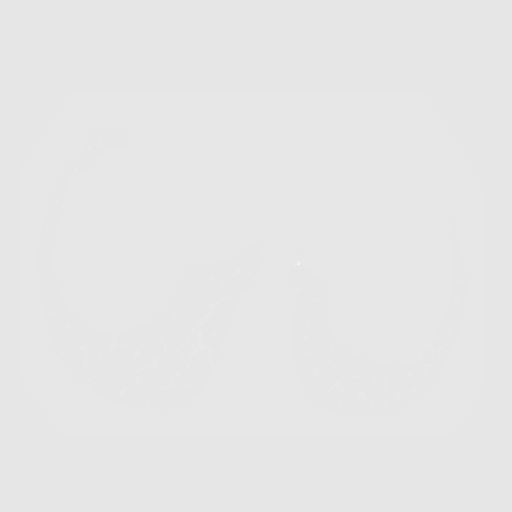
[frame 100/300  lung]
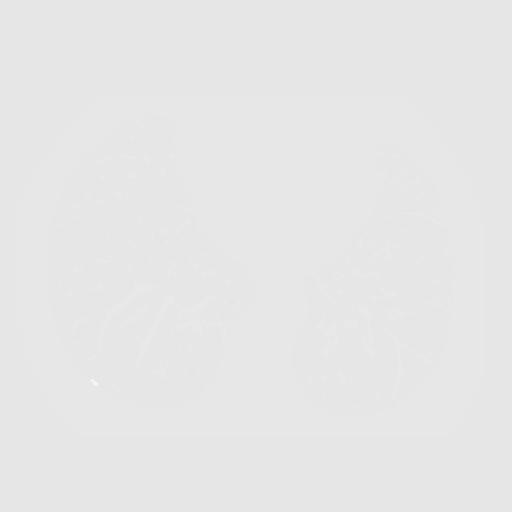
[frame 133/300  mediastinal]
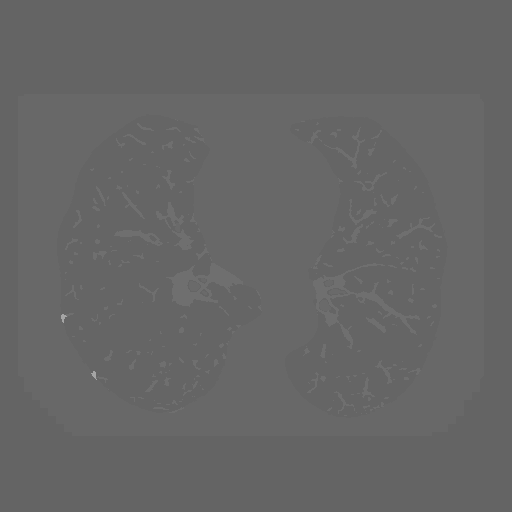
[frame 133/300  lung]
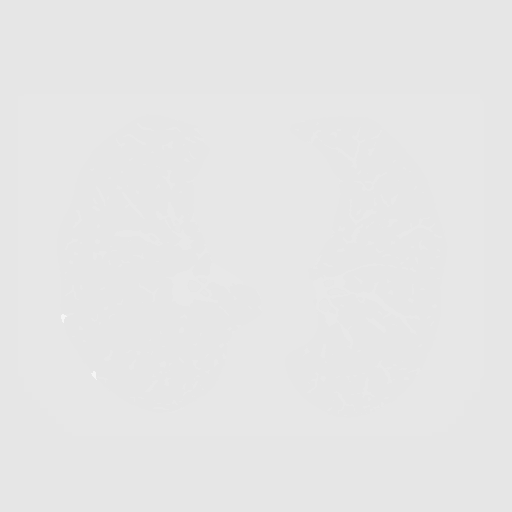
[frame 167/300  lung]
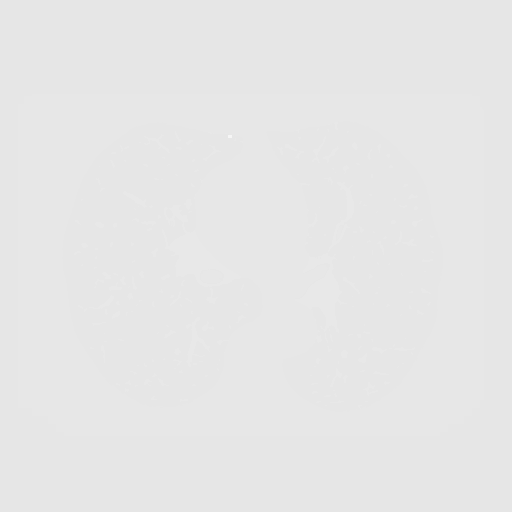
[frame 200/300  lung]
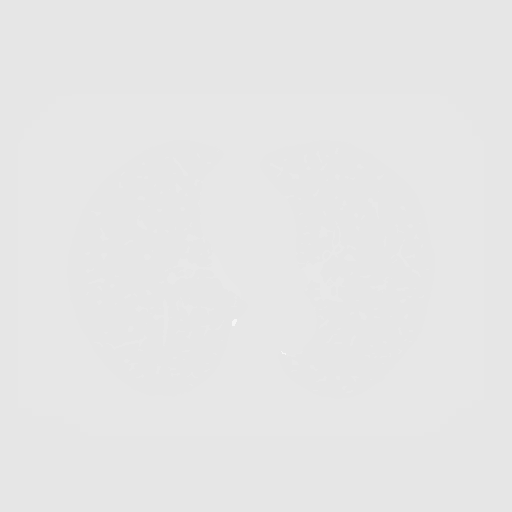
[frame 233/300  lung]
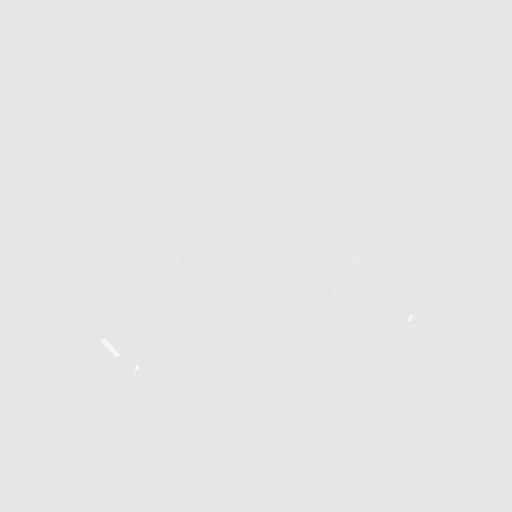
[frame 266/300  mediastinal]
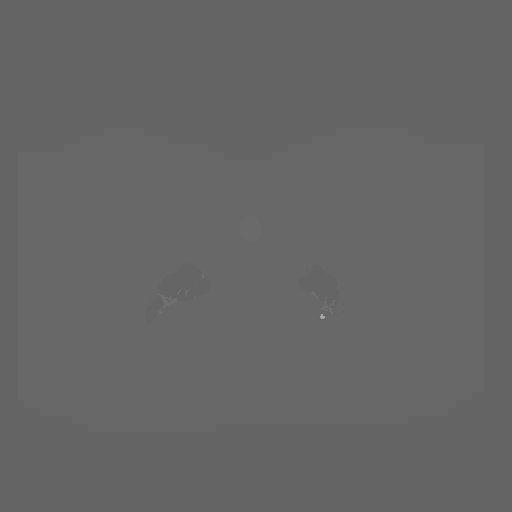
[frame 266/300  lung]
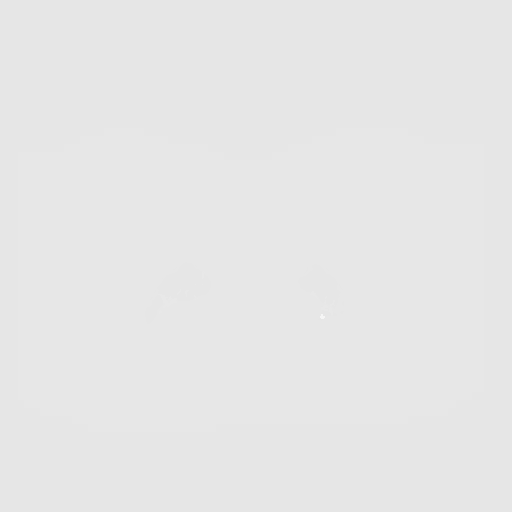
[frame 300/300  lung]
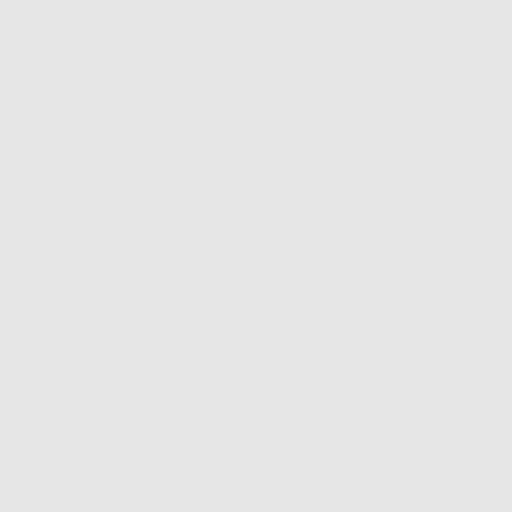

[10 of 10 positions shown; findings below may reference images not displayed]

FINDINGS: Cardiovascular: Normal heart size. Aortic atherosclerosis.
Calcification in the LAD and left circumflex coronary artery noted.
No pericardial effusion.

Mediastinum/Nodes: No enlarged mediastinal, hilar, or axillary lymph
nodes. Thyroid gland, trachea, and esophagus demonstrate no
significant findings.

Lungs/Pleura: No pleural effusions. Mild centrilobular and
paraseptal emphysema. Stable calcified nodule in the posterior right
lung base. Previously noted noncalcified nodule in the right upper
lobe has resolved in the interval. No new nodules identified.

Upper Abdomen: Stone within the gallbladder measures 1.1 cm, image
61/2. No acute findings noted within the upper abdomen.

Musculoskeletal: No chest wall mass or suspicious bone lesions
identified.
IMPRESSION: 1. Lung-RADS 1, negative. Continue annual screening with low-dose
chest CT without contrast in 12 months.
2. Aortic Atherosclerosis (EFRD4-IGP.P) and Emphysema (EFRD4-2G0.4).
3. Lad and left circumflex coronary artery calcifications.
4. Gallstone.

## 2020-06-06 ENCOUNTER — Other Ambulatory Visit: Payer: Self-pay

## 2020-06-07 ENCOUNTER — Ambulatory Visit (INDEPENDENT_AMBULATORY_CARE_PROVIDER_SITE_OTHER): Payer: Medicare Other | Admitting: Internal Medicine

## 2020-06-07 ENCOUNTER — Encounter: Payer: Self-pay | Admitting: Internal Medicine

## 2020-06-07 VITALS — BP 124/68 | HR 58 | Temp 98.3°F | Ht 63.0 in | Wt 156.0 lb

## 2020-06-07 DIAGNOSIS — F17219 Nicotine dependence, cigarettes, with unspecified nicotine-induced disorders: Secondary | ICD-10-CM

## 2020-06-07 DIAGNOSIS — Z0001 Encounter for general adult medical examination with abnormal findings: Secondary | ICD-10-CM

## 2020-06-07 DIAGNOSIS — F32A Depression, unspecified: Secondary | ICD-10-CM | POA: Diagnosis not present

## 2020-06-07 DIAGNOSIS — F419 Anxiety disorder, unspecified: Secondary | ICD-10-CM

## 2020-06-07 DIAGNOSIS — E538 Deficiency of other specified B group vitamins: Secondary | ICD-10-CM | POA: Diagnosis not present

## 2020-06-07 DIAGNOSIS — J410 Simple chronic bronchitis: Secondary | ICD-10-CM

## 2020-06-07 DIAGNOSIS — I700 Atherosclerosis of aorta: Secondary | ICD-10-CM

## 2020-06-07 DIAGNOSIS — K8020 Calculus of gallbladder without cholecystitis without obstruction: Secondary | ICD-10-CM | POA: Diagnosis not present

## 2020-06-07 DIAGNOSIS — E559 Vitamin D deficiency, unspecified: Secondary | ICD-10-CM

## 2020-06-07 DIAGNOSIS — R7303 Prediabetes: Secondary | ICD-10-CM

## 2020-06-07 LAB — URINALYSIS, ROUTINE W REFLEX MICROSCOPIC
Bilirubin Urine: NEGATIVE
Ketones, ur: NEGATIVE
Nitrite: NEGATIVE
Specific Gravity, Urine: 1.015 (ref 1.000–1.030)
Total Protein, Urine: NEGATIVE
Urine Glucose: NEGATIVE
Urobilinogen, UA: 0.2 (ref 0.0–1.0)
pH: 6 (ref 5.0–8.0)

## 2020-06-07 LAB — BASIC METABOLIC PANEL
BUN: 14 mg/dL (ref 6–23)
CO2: 31 mEq/L (ref 19–32)
Calcium: 10 mg/dL (ref 8.4–10.5)
Chloride: 105 mEq/L (ref 96–112)
Creatinine, Ser: 0.77 mg/dL (ref 0.40–1.20)
GFR: 73.09 mL/min (ref >60.00)
Glucose, Bld: 100 mg/dL — ABNORMAL HIGH (ref 70–99)
Potassium: 4.4 mEq/L (ref 3.5–5.1)
Sodium: 141 mEq/L (ref 135–145)

## 2020-06-07 LAB — CBC WITH DIFFERENTIAL/PLATELET
Basophils Absolute: 0.1 10*3/uL (ref 0.0–0.1)
Basophils Relative: 0.7 % (ref 0.0–3.0)
Eosinophils Absolute: 0.3 10*3/uL (ref 0.0–0.7)
Eosinophils Relative: 3.8 % (ref 0.0–5.0)
HCT: 41.5 % (ref 36.0–46.0)
Hemoglobin: 14.1 g/dL (ref 12.0–15.0)
Lymphocytes Relative: 25.2 % (ref 12.0–46.0)
Lymphs Abs: 1.9 10*3/uL (ref 0.7–4.0)
MCHC: 34 g/dL (ref 30.0–36.0)
MCV: 91.6 fl (ref 78.0–100.0)
Monocytes Absolute: 0.7 10*3/uL (ref 0.1–1.0)
Monocytes Relative: 8.5 % (ref 3.0–12.0)
Neutro Abs: 4.8 10*3/uL (ref 1.4–7.7)
Neutrophils Relative %: 61.8 % (ref 43.0–77.0)
Platelets: 225 10*3/uL (ref 150.0–400.0)
RBC: 4.53 Mil/uL (ref 3.87–5.11)
RDW: 14.1 % (ref 11.5–15.5)
WBC: 7.7 10*3/uL (ref 4.0–10.5)

## 2020-06-07 LAB — VITAMIN B12: Vitamin B-12: 1101 pg/mL — ABNORMAL HIGH (ref 211–911)

## 2020-06-07 LAB — VITAMIN D 25 HYDROXY (VIT D DEFICIENCY, FRACTURES): VITD: 90.3 ng/mL (ref 30.00–100.00)

## 2020-06-07 LAB — HEPATIC FUNCTION PANEL
ALT: 31 U/L (ref 0–35)
AST: 30 U/L (ref 0–37)
Albumin: 4.4 g/dL (ref 3.5–5.2)
Alkaline Phosphatase: 40 U/L (ref 39–117)
Bilirubin, Direct: 0.2 mg/dL (ref 0.0–0.3)
Total Bilirubin: 0.6 mg/dL (ref 0.2–1.2)
Total Protein: 7.8 g/dL (ref 6.0–8.3)

## 2020-06-07 LAB — HEMOGLOBIN A1C: Hgb A1c MFr Bld: 6.1 % (ref 4.6–6.5)

## 2020-06-07 LAB — LIPID PANEL
Cholesterol: 124 mg/dL (ref 0–200)
HDL: 39.9 mg/dL (ref >39.00)
LDL Cholesterol: 63 mg/dL (ref 0–99)
NonHDL: 84.21
Total CHOL/HDL Ratio: 3
Triglycerides: 104 mg/dL (ref 0.0–149.0)
VLDL: 20.8 mg/dL (ref 0.0–40.0)

## 2020-06-07 LAB — TSH: TSH: 0.75 u[IU]/mL (ref 0.35–4.50)

## 2020-06-07 MED ORDER — LORAZEPAM 0.5 MG PO TABS
ORAL_TABLET | ORAL | 1 refills | Status: DC
Start: 2020-06-07 — End: 2021-04-04

## 2020-06-07 NOTE — Progress Notes (Incomplete)
Patient ID: Meagan Holmes, female   DOB: February 18, 1941, 80 y.o.   MRN: [id number]         Chief Complaint:: wellness exam and Follow-up  ***      HPI:  Meagan Holmes is a 80 y.o. female here for wellness exam   Wt Readings from Last 3 Encounters:  06/07/20 156 lb (70.8 kg)  12/14/19 152 lb 9.6 oz (69.2 kg)  12/02/18 150 lb (68 kg)   BP Readings from Last 3 Encounters:  06/07/20 124/68  12/14/19 126/62  12/02/18 122/60   Immunization History  Administered Date(s) Administered  . Influenza Split 02/13/2011, 01/21/2012, 02/01/2014  . Influenza Whole 02/10/2007, 02/17/2008, 02/17/2009, 01/17/2010  . Influenza, High Dose Seasonal PF 01/17/2013  . Influenza,inj,quad, With Preservative 02/10/2019  . Influenza-Unspecified 02/06/2015, 01/28/2016, 02/12/2017, 12/30/2017, 02/08/2020  . PFIZER Comirnaty(Gray Top)Covid-19 Tri-Sucrose Vaccine 06/12/2019, 07/07/2019, 04/25/2020  . Pneumococcal Conjugate-13 04/12/2014, 02/12/2017  . Pneumococcal Polysaccharide-23 02/17/2008, 02/01/2014, 12/30/2017  . Pneumococcal-Unspecified 12/30/2017  . Td 02/10/2007, 07/30/2019  . Tdap 07/09/2018, 07/09/2018  . Zoster Recombinat (Shingrix) 11/27/2018   Health Maintenance Due  Topic Date Due  . Hepatitis C Screening  Never done         Also ***  Past Medical History:  Diagnosis Date  . Allergic rhinitis    a lot better after septoplasty- remote  . Allergic rhinitis 02/10/2007   Qualifier: Diagnosis of  By: Larose Kells MD, [place] Anxiety and depression 02/10/2007  . Carotid artery disease ([place]) 05/21/2014   Carotid US 11/2018: Bilateral ICA 1-39  . COPD (chronic obstructive pulmonary disease) ([place]) 10/02/2010  . Family history of early CAD 09/02/2017  . Heart murmur 07/06/2014  . Hematuria 2004   (-) CT abd-pelvis (-) cystoscopy  . Hyperlipidemia 02/17/2009   Qualifier: Diagnosis of  By: Larose Kells MD, [place] Hypothyroidism   . 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  . Osteopenia    . Osteoporosis 10/02/2010    DEXA  2008 T score -2.1 DEXA 02-2009 Tscore - 3.3 Started fosamax 2009, switched to later to actonel to increase compliance   . Pre-diabetes 12/17/2017  . Triceps tendonitis 12/14/2015   Past Surgical History:  Procedure Laterality Date  . APPENDECTOMY    . CARPAL TUNNEL RELEASE    . NASAL SEPTUM SURGERY    . THYROIDECTOMY      reports that she has been smoking cigarettes. She started smoking about 8 years ago. She has a 43.50 pack-year smoking history. She has never used smokeless tobacco. She reports that she does not drink alcohol and does not use drugs. family history includes Colon cancer in an other family member; Diabetes in her mother; Heart attack in her father; Hypertension in her mother. Allergies  Allergen Reactions  . Amoxil [Amoxicillin] Hives   Current Outpatient Medications on File Prior to Visit  Medication Sig Dispense Refill  . aspirin 81 MG tablet Take 81 mg by mouth daily.    [name] atorvastatin (LIPITOR) 80 MG tablet TAKE 1 TABLET (80 MG TOTAL) BY MOUTH DAILY AT 6 PM. PT MUST KEEP UPCOMING APPT IN AUG TO GET FURTHER REFILLS 90 tablet 3  . calcium gluconate 500 MG tablet Take 500 mg by mouth daily.    . Cholecalciferol (VITAMIN D3 PO) Take 2,000 Units by mouth daily.     [name] levocetirizine (XYZAL) 5 MG tablet TAKE 1 TABLET BY MOUTH EVERY DAY IN THE EVENING 30 tablet 1  . LORazepam (ATIVAN) 0.5 MG  tablet TAKE 1 TABLET BY MOUTH TWICE A DAY AS NEEDED FOR ANXIETY 60 tablet 1  . SYNTHROID 88 MCG tablet TAKE 1 TABLET (88 MCG TOTAL) BY MOUTH DAILY BEFORE BREAKFAST. 90 tablet 3  . azithromycin (ZITHROMAX Z-PAK) 250 MG tablet 2 tab by mouth day 1, then 1 per day (Patient not taking: Reported on 06/07/2020) 6 tablet 1   No current facility-administered medications on file prior to visit.        ROS:  All others reviewed and negative.  Objective        PE:  BP 124/68   Pulse (!) 58   Temp 98.3 F (36.8 C) (Oral)   Ht 5\' 3"  (1.6 m)   Wt 156 lb (70.8  kg)   SpO2 95%   BMI 27.63 kg/m                 Constitutional: Pt appears in NAD               HENT: Head: NCAT.                Right Ear: External ear normal.                 Left Ear: External ear normal.                Eyes: . Pupils are equal, round, and reactive to light. Conjunctivae and EOM are normal               Nose: without d/c or deformity               Neck: Neck supple. Gross normal ROM               Cardiovascular: Normal rate and regular rhythm.                 Pulmonary/Chest: Effort normal and breath sounds without rales or wheezing.                Abd:  Soft, NT, ND, + BS, no organomegaly               Neurological: Pt is alert. At baseline orientation, motor grossly intact               Skin: Skin is warm. No rashes, no other new lesions, LE edema - ***               Psychiatric: Pt behavior is normal without agitation   Micro: none  Cardiac tracings I have personally interpreted today:  none  Pertinent Radiological findings (summarize): none   Lab Results  Component Value Date   WBC 7.9 08/20/2019   HGB 13.5 08/20/2019   HCT 39.7 08/20/2019   PLT 232.0 08/20/2019   GLUCOSE 108 (H) 08/20/2019   CHOL 116 08/20/2019   TRIG 57.0 08/20/2019   HDL 43.20 08/20/2019   LDLDIRECT 171.2 03/19/2012   LDLCALC 62 08/20/2019   ALT 19 08/20/2019   AST 19 08/20/2019   NA 142 08/20/2019   K 4.0 08/20/2019   CL 105 08/20/2019   CREATININE 0.73 08/20/2019   BUN 11 08/20/2019   CO2 31 08/20/2019   TSH 0.82 08/20/2019   HGBA1C 6.1 08/20/2019   Assessment/Plan:  Meagan Holmes is a 80 y.o. White or Caucasian [1] female with  has a past medical history of Allergic rhinitis, Allergic rhinitis (02/10/2007), Anxiety and depression (02/10/2007), Carotid artery disease ([place]) (05/21/2014), COPD (chronic obstructive pulmonary  disease) ([place]) (10/02/2010), Family history of early CAD (09/02/2017), Heart murmur (07/06/2014), Hematuria (2004), Hyperlipidemia (02/17/2009),  Hypothyroidism, Nicotine dependence with nicotine-induced disorder (06/19/2017), Osteopenia, Osteoporosis (10/02/2010), Pre-diabetes (12/17/2017), and Triceps tendonitis (12/14/2015). No problem-specific Assessment & Plan notes found for this encounter.  Followup: No follow-ups on file.  Cathlean Cower, MD 06/07/2020 11:08 AM [facility]

## 2020-06-07 NOTE — Patient Instructions (Signed)
Please quit smoking  Please continue all other medications as before, and refills have been done if requested - ativan  Please have the pharmacy call with any other refills you may need.  Please continue your efforts at being more active, low cholesterol diet, and weight control.  You are otherwise up to date with prevention measures today.  Please keep your appointments with your specialists as you may have planned  Please go to the LAB at the blood drawing area for the tests to be done  You will be contacted by phone if any changes need to be made immediately.  Otherwise, you will receive a letter about your results with an explanation, but please check with MyChart first.  Please remember to sign up for MyChart if you have not done so, as this will be important to you in the future with finding out test results, communicating by private email, and scheduling acute appointments online when needed.  Please make an Appointment to return in 6 months, or sooner if needed

## 2020-06-07 NOTE — Progress Notes (Signed)
Patient ID: Meagan Holmes, female   DOB: 08/16/1940, 80 y.o.   MRN: [id number]         Chief Complaint:: wellness exam and Follow-up anxiety, smoking, hyperglycemia, asympt gallstones,        HPI:  Meagan Holmes is a 80 y.o. female here for wellness exam; still smoking, not ready to quit , declines chantix for now   Wt Readings from Last 3 Encounters:  06/07/20 156 lb (70.8 kg)  12/14/19 152 lb 9.6 oz (69.2 kg)  12/02/18 150 lb (68 kg)   BP Readings from Last 3 Encounters:  06/07/20 124/68  12/14/19 126/62  12/02/18 122/60   Immunization History  Administered Date(s) Administered  . Influenza Split 02/13/2011, 01/21/2012, 02/01/2014  . Influenza Whole 02/10/2007, 02/17/2008, 02/17/2009, 01/17/2010  . Influenza, High Dose Seasonal PF 01/17/2013  . Influenza,inj,quad, With Preservative 02/10/2019  . Influenza-Unspecified 02/06/2015, 01/28/2016, 02/12/2017, 12/30/2017, 02/08/2020  . PFIZER Comirnaty(Gray Top)Covid-19 Tri-Sucrose Vaccine 06/12/2019, 07/07/2019, 04/25/2020  . Pneumococcal Conjugate-13 04/12/2014, 02/12/2017  . Pneumococcal Polysaccharide-23 02/17/2008, 02/01/2014, 12/30/2017  . Pneumococcal-Unspecified 12/30/2017  . Td 02/10/2007, 07/30/2019  . Tdap 07/09/2018, 07/09/2018  . Zoster Recombinat (Shingrix) 11/27/2018   Health Maintenance Due  Topic Date Due  . Hepatitis C Screening  Never done         Also c/o persistent anxiety but Denies worsening depressive symptoms, suicidal ideation, or panic.  Asks for control as has been mod to severe at times, life activity inhibiting and nothing seems to make better or worse.   Pt denies polydipsia, polyuria.  Recent imaging with gallstones, but Denies worsening reflux, abd pain, dysphagia, n/v, bowel change or blood.     Past Medical History:  Diagnosis Date  . Allergic rhinitis    a lot better after septoplasty- remote  . Allergic rhinitis 02/10/2007   Qualifier: Diagnosis of  By: Larose Kells MD, [place] Anxiety and  depression 02/10/2007  . Carotid artery disease ([place]) 05/21/2014   Carotid US 11/2018: Bilateral ICA 1-39  . COPD (chronic obstructive pulmonary disease) ([place]) 10/02/2010  . Family history of early CAD 09/02/2017  . Heart murmur 07/06/2014  . Hematuria 2004   (-) CT abd-pelvis (-) cystoscopy  . Hyperlipidemia 02/17/2009   Qualifier: Diagnosis of  By: Larose Kells MD, [place] Hypothyroidism   . 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  . Osteopenia   . Osteoporosis 10/02/2010    DEXA  2008 T score -2.1 DEXA 02-2009 Tscore - 3.3 Started fosamax 2009, switched to later to actonel to increase compliance   . Pre-diabetes 12/17/2017  . Triceps tendonitis 12/14/2015   Past Surgical History:  Procedure Laterality Date  . APPENDECTOMY    . CARPAL TUNNEL RELEASE    . NASAL SEPTUM SURGERY    . THYROIDECTOMY      reports that she has been smoking cigarettes. She started smoking about 8 years ago. She has a 43.50 pack-year smoking history. She has never used smokeless tobacco. She reports that she does not drink alcohol and does not use drugs. family history includes Colon cancer in an other family member; Diabetes in her mother; Heart attack in her father; Hypertension in her mother. Allergies  Allergen Reactions  . Amoxil [Amoxicillin] Hives   Current Outpatient Medications on File Prior to Visit  Medication Sig Dispense Refill  . aspirin 81 MG tablet Take 81 mg by mouth daily.    [name] atorvastatin (LIPITOR) 80 MG tablet TAKE 1 TABLET (80 MG TOTAL)  BY MOUTH DAILY AT 6 PM. PT MUST KEEP UPCOMING APPT IN AUG TO GET FURTHER REFILLS 90 tablet 3  . calcium gluconate 500 MG tablet Take 500 mg by mouth daily.    . Cholecalciferol (VITAMIN D3 PO) Take 2,000 Units by mouth daily.     [name] levocetirizine (XYZAL) 5 MG tablet TAKE 1 TABLET BY MOUTH EVERY DAY IN THE EVENING 30 tablet 1  . SYNTHROID 88 MCG tablet TAKE 1 TABLET (88 MCG TOTAL) BY MOUTH DAILY BEFORE BREAKFAST. 90 tablet 3  . azithromycin  (ZITHROMAX Z-PAK) 250 MG tablet 2 tab by mouth day 1, then 1 per day (Patient not taking: Reported on 06/07/2020) 6 tablet 1   No current facility-administered medications on file prior to visit.        ROS:  All others reviewed and negative.  Objective        PE:  BP 124/68   Pulse (!) 58   Temp 98.3 F (36.8 C) (Oral)   Ht 5\' 3"  (1.6 m)   Wt 156 lb (70.8 kg)   SpO2 95%   BMI 27.63 kg/m                 Constitutional: Pt appears in NAD               HENT: Head: NCAT.                Right Ear: External ear normal.                 Left Ear: External ear normal.                Eyes: . Pupils are equal, round, and reactive to light. Conjunctivae and EOM are normal               Nose: without d/c or deformity               Neck: Neck supple. Gross normal ROM               Cardiovascular: Normal rate and regular rhythm.                 Pulmonary/Chest: Effort normal and breath sounds without rales or wheezing.                Abd:  Soft, NT, ND, + BS, no organomegaly               Neurological: Pt is alert. At baseline orientation, motor grossly intact               Skin: Skin is warm. No rashes, no other new lesions, LE edema - none               Psychiatric: Pt behavior is normal without agitation but 2+ nervous  Micro: none  Cardiac tracings I have personally interpreted today:  none  Pertinent Radiological findings (summarize): none   Lab Results  Component Value Date   WBC 7.7 06/07/2020   HGB 14.1 06/07/2020   HCT 41.5 06/07/2020   PLT 225.0 06/07/2020   GLUCOSE 100 (H) 06/07/2020   CHOL 124 06/07/2020   TRIG 104.0 06/07/2020   HDL 39.90 06/07/2020   LDLDIRECT 171.2 03/19/2012   LDLCALC 63 06/07/2020   ALT 31 06/07/2020   AST 30 06/07/2020   NA 141 06/07/2020   K 4.4 06/07/2020   CL 105 06/07/2020   CREATININE 0.77 06/07/2020   BUN 14 06/07/2020  CO2 31 06/07/2020   TSH 0.75 06/07/2020   HGBA1C 6.1 06/07/2020   Assessment/Plan:  Meagan Holmes is a 80  y.o. White or Caucasian [1] female with  has a past medical history of Allergic rhinitis, Allergic rhinitis (02/10/2007), Anxiety and depression (02/10/2007), Carotid artery disease ([place]) (05/21/2014), COPD (chronic obstructive pulmonary disease) ([place]) (10/02/2010), Family history of early CAD (09/02/2017), Heart murmur (07/06/2014), Hematuria (2004), Hyperlipidemia (02/17/2009), Hypothyroidism, Nicotine dependence with nicotine-induced disorder (06/19/2017), Osteopenia, Osteoporosis (10/02/2010), Pre-diabetes (12/17/2017), and Triceps tendonitis (12/14/2015). Encounter for well adult exam with abnormal findings Age and sex appropriate education and counseling updated with regular exercise and diet Referrals for preventative services - none needed Immunizations addressed - none needed Smoking counseling  - counseling done, urged to quit Evidence for depression or other mood disorder - none significant Most recent labs reviewed. I have personally reviewed and have noted: 1) the patient's medical and social history 2) The patient's current medications and supplements 3) The patient's height, weight, and BMI have been recorded in the chart   Anxiety and depression Mod to severe anxiety, for ativan bid prn,  to f/u any worsening symptoms or concerns  Aortic atherosclerosis (HCC) Stable, for statin lipitor 80, and low chol diet  Current Outpatient Medications (Endocrine & Metabolic):  .  SYNTHROID 88 MCG tablet, TAKE 1 TABLET (88 MCG TOTAL) BY MOUTH DAILY BEFORE BREAKFAST.  Current Outpatient Medications (Cardiovascular):  .  atorvastatin (LIPITOR) 80 MG tablet, TAKE 1 TABLET (80 MG TOTAL) BY MOUTH DAILY AT 6 PM. PT MUST KEEP UPCOMING APPT IN AUG TO GET FURTHER REFILLS  Current Outpatient Medications (Respiratory):  .  levocetirizine (XYZAL) 5 MG tablet, TAKE 1 TABLET BY MOUTH EVERY DAY IN THE EVENING  Current Outpatient Medications (Analgesics):  .  aspirin 81 MG tablet, Take 81 mg by mouth  daily.   Current Outpatient Medications (Other):  .  calcium gluconate 500 MG tablet, Take 500 mg by mouth daily. .  Cholecalciferol (VITAMIN D3 PO), Take 2,000 Units by mouth daily.  [name]  azithromycin (ZITHROMAX Z-PAK) 250 MG tablet, 2 tab by mouth day 1, then 1 per day (Patient not taking: Reported on 06/07/2020) .  LORazepam (ATIVAN) 0.5 MG tablet, TAKE 1 TABLET BY MOUTH TWICE A DAY AS NEEDED FOR ANXIETY   COPD (chronic obstructive pulmonary disease) (HCC) Stable, cont current med rx, declines need for inhaler   Gallstone Asympt, d/w pt, no current tx needed  Nicotine dependence with nicotine-induced disorder Urged to quit, pt not ready  Pre-diabetes Lab Results  Component Value Date   HGBA1C 6.1 06/07/2020   Stable, pt to continue current medical treatment  - diet   Followup: Return in about 6 months (around 12/05/2020).  Cathlean Cower, MD 06/14/2020 9:40 PM [facility]'

## 2020-06-14 ENCOUNTER — Encounter: Payer: Self-pay | Admitting: Internal Medicine

## 2020-06-14 NOTE — Assessment & Plan Note (Signed)
Age and sex appropriate education and counseling updated with regular exercise and diet Referrals for preventative services - none needed Immunizations addressed - none needed Smoking counseling  - counseling done, urged to quit Evidence for depression or other mood disorder - none significant Most recent labs reviewed. I have personally reviewed and have noted: 1) the patient's medical and social history 2) The patient's current medications and supplements 3) The patient's height, weight, and BMI have been recorded in the chart

## 2020-06-14 NOTE — Assessment & Plan Note (Signed)
Lab Results  Component Value Date   HGBA1C 6.1 06/07/2020   Stable, pt to continue current medical treatment  - diet

## 2020-06-14 NOTE — Assessment & Plan Note (Signed)
Asympt, d/w pt, no current tx needed

## 2020-06-14 NOTE — Assessment & Plan Note (Signed)
Stable, for statin lipitor 80, and low chol diet  Current Outpatient Medications (Endocrine & Metabolic):  .  SYNTHROID 88 MCG tablet, TAKE 1 TABLET (88 MCG TOTAL) BY MOUTH DAILY BEFORE BREAKFAST.  Current Outpatient Medications (Cardiovascular):  .  atorvastatin (LIPITOR) 80 MG tablet, TAKE 1 TABLET (80 MG TOTAL) BY MOUTH DAILY AT 6 PM. PT MUST KEEP UPCOMING APPT IN AUG TO GET FURTHER REFILLS  Current Outpatient Medications (Respiratory):  .  levocetirizine (XYZAL) 5 MG tablet, TAKE 1 TABLET BY MOUTH EVERY DAY IN THE EVENING  Current Outpatient Medications (Analgesics):  .  aspirin 81 MG tablet, Take 81 mg by mouth daily.   Current Outpatient Medications (Other):  .  calcium gluconate 500 MG tablet, Take 500 mg by mouth daily. .  Cholecalciferol (VITAMIN D3 PO), Take 2,000 Units by mouth daily.  [name]  azithromycin (ZITHROMAX Z-PAK) 250 MG tablet, 2 tab by mouth day 1, then 1 per day (Patient not taking: Reported on 06/07/2020) .  LORazepam (ATIVAN) 0.5 MG tablet, TAKE 1 TABLET BY MOUTH TWICE A DAY AS NEEDED FOR ANXIETY

## 2020-06-14 NOTE — Assessment & Plan Note (Signed)
Urged to quit, pt not ready

## 2020-06-14 NOTE — Assessment & Plan Note (Signed)
Mod to severe anxiety, for ativan bid prn,  to f/u any worsening symptoms or concerns

## 2020-06-14 NOTE — Assessment & Plan Note (Signed)
Stable, cont current med rx, declines need for inhaler

## 2020-06-23 DIAGNOSIS — H25013 Cortical age-related cataract, bilateral: Secondary | ICD-10-CM | POA: Diagnosis not present

## 2020-06-23 DIAGNOSIS — H2513 Age-related nuclear cataract, bilateral: Secondary | ICD-10-CM | POA: Diagnosis not present

## 2020-06-23 DIAGNOSIS — H25043 Posterior subcapsular polar age-related cataract, bilateral: Secondary | ICD-10-CM | POA: Diagnosis not present

## 2020-06-23 DIAGNOSIS — H2512 Age-related nuclear cataract, left eye: Secondary | ICD-10-CM | POA: Diagnosis not present

## 2020-06-23 DIAGNOSIS — H40013 Open angle with borderline findings, low risk, bilateral: Secondary | ICD-10-CM | POA: Diagnosis not present

## 2020-06-26 ENCOUNTER — Other Ambulatory Visit: Payer: Self-pay | Admitting: Internal Medicine

## 2020-06-26 DIAGNOSIS — E039 Hypothyroidism, unspecified: Secondary | ICD-10-CM

## 2020-06-26 NOTE — Telephone Encounter (Signed)
Please refill as per office routine med refill policy (all routine meds refilled for 3 mo or monthly per pt preference up to one year from last visit, then month to month grace period for 3 mo, then further med refills will have to be denied)  

## 2020-07-31 DIAGNOSIS — H2512 Age-related nuclear cataract, left eye: Secondary | ICD-10-CM | POA: Diagnosis not present

## 2020-08-01 DIAGNOSIS — H25041 Posterior subcapsular polar age-related cataract, right eye: Secondary | ICD-10-CM | POA: Diagnosis not present

## 2020-08-01 DIAGNOSIS — H2511 Age-related nuclear cataract, right eye: Secondary | ICD-10-CM | POA: Diagnosis not present

## 2020-08-01 DIAGNOSIS — H25011 Cortical age-related cataract, right eye: Secondary | ICD-10-CM | POA: Diagnosis not present

## 2020-08-21 DIAGNOSIS — H2511 Age-related nuclear cataract, right eye: Secondary | ICD-10-CM | POA: Diagnosis not present

## 2020-08-30 ENCOUNTER — Telehealth: Payer: Self-pay | Admitting: Internal Medicine

## 2020-08-30 NOTE — Telephone Encounter (Signed)
LVM for pt to rtn my call to r/s appt with NHA on 08/31/20. Please r/s this appt if pt calls the office.

## 2020-08-31 ENCOUNTER — Ambulatory Visit: Payer: Medicare Other

## 2020-09-04 ENCOUNTER — Other Ambulatory Visit: Payer: Self-pay | Admitting: Internal Medicine

## 2020-09-04 DIAGNOSIS — E039 Hypothyroidism, unspecified: Secondary | ICD-10-CM

## 2020-09-04 NOTE — Telephone Encounter (Signed)
Please refill as per office routine med refill policy (all routine meds refilled for 3 mo or monthly per pt preference up to one year from last visit, then month to month grace period for 3 mo, then further med refills will have to be denied)  

## 2020-09-06 ENCOUNTER — Other Ambulatory Visit: Payer: Self-pay | Admitting: Internal Medicine

## 2020-09-06 DIAGNOSIS — E039 Hypothyroidism, unspecified: Secondary | ICD-10-CM

## 2020-09-06 NOTE — Telephone Encounter (Signed)
Please refill as per office routine med refill policy (all routine meds refilled for 3 mo or monthly per pt preference up to one year from last visit, then month to month grace period for 3 mo, then further med refills will have to be denied)  

## 2020-09-14 ENCOUNTER — Telehealth: Payer: Self-pay | Admitting: Internal Medicine

## 2020-09-14 NOTE — Telephone Encounter (Signed)
LVM for pt to rtn my call to r/s appt with nha twice. Please R/S this appt if pt calls the office.

## 2020-09-15 ENCOUNTER — Telehealth: Payer: Self-pay | Admitting: Internal Medicine

## 2020-09-15 NOTE — Telephone Encounter (Signed)
LVM for pt to rtn my call to r/s appt with nha that she had on 08/31/20. Please r/s appt if pt calls the office.

## 2020-09-21 DIAGNOSIS — J069 Acute upper respiratory infection, unspecified: Secondary | ICD-10-CM | POA: Diagnosis not present

## 2020-10-10 ENCOUNTER — Telehealth: Payer: Self-pay | Admitting: Internal Medicine

## 2020-10-10 DIAGNOSIS — Z961 Presence of intraocular lens: Secondary | ICD-10-CM | POA: Diagnosis not present

## 2020-10-10 DIAGNOSIS — H18413 Arcus senilis, bilateral: Secondary | ICD-10-CM | POA: Diagnosis not present

## 2020-10-10 DIAGNOSIS — H02831 Dermatochalasis of right upper eyelid: Secondary | ICD-10-CM | POA: Diagnosis not present

## 2020-10-10 DIAGNOSIS — H40013 Open angle with borderline findings, low risk, bilateral: Secondary | ICD-10-CM | POA: Diagnosis not present

## 2020-10-10 NOTE — Telephone Encounter (Signed)
LVM for pt to rtn my call to schedule AWV with NHA. Please schedule AWV if pt calls the office  

## 2020-11-01 ENCOUNTER — Ambulatory Visit
Admission: EM | Admit: 2020-11-01 | Discharge: 2020-11-01 | Disposition: A | Discharge status: Home / Self Care | Payer: Medicare Other | Attending: Family Medicine | Admitting: Family Medicine

## 2020-11-01 ENCOUNTER — Encounter: Payer: Self-pay | Admitting: Emergency Medicine

## 2020-11-01 ENCOUNTER — Other Ambulatory Visit: Payer: Self-pay

## 2020-11-01 DIAGNOSIS — J069 Acute upper respiratory infection, unspecified: Secondary | ICD-10-CM | POA: Diagnosis not present

## 2020-11-01 LAB — POCT RAPID STREP A (OFFICE): Rapid Strep A Screen: NEGATIVE

## 2020-11-01 MED ORDER — ALBUTEROL SULFATE HFA 108 (90 BASE) MCG/ACT IN AERS: 1.0000 | INHALATION_SPRAY | Freq: Four times a day (QID) | RESPIRATORY_TRACT | 0 refills | Status: DC | PRN

## 2020-11-01 MED ORDER — LIDOCAINE VISCOUS HCL 2 % MT SOLN: 10.0000 mL | OROMUCOSAL | 0 refills | Status: DC | PRN

## 2020-11-01 MED ORDER — PROMETHAZINE-DM 6.25-15 MG/5ML PO SYRP: 5.0000 mL | ORAL_SOLUTION | Freq: Four times a day (QID) | ORAL | 0 refills | Status: DC | PRN

## 2020-11-01 NOTE — ED Provider Notes (Signed)
[facility]    CSN: [id number] Arrival date & time: 11/01/20  1306      History   Chief Complaint Chief Complaint  Patient presents with   Sore Throat    HPI Meagan Holmes is a 80 y.o. female.   Patient presenting today with 2-day history of sore throat, productive cough, malaise.  Denies fever, chills, chest pain, shortness of breath, abdominal pain, nausea vomiting or diarrhea.  So far has been taking over-the-counter cold and cough medications with no relief.  States her daughter has been sick in the last week or so but unclear with what.  Per chart review history of COPD not on any inhalers, patient denies this and states that she has not smoked a cigarette for several days now and has never required an inhaler.  Home COVID testing was negative yesterday.   Past Medical History:  Diagnosis Date   Allergic rhinitis    a lot better after septoplasty- remote   Allergic rhinitis 02/10/2007   Qualifier: Diagnosis of  By: Larose Kells MD, [place]    Anxiety and depression 02/10/2007   Carotid artery disease ([place]) 05/21/2014   Carotid US 11/2018: Bilateral ICA 1-39   COPD (chronic obstructive pulmonary disease) ([place]) 10/02/2010   Family history of early CAD 09/02/2017   Heart murmur 07/06/2014   Hematuria 2004   (-) CT abd-pelvis (-) cystoscopy   Hyperlipidemia 02/17/2009   Qualifier: Diagnosis of  By: Larose Kells MD, [place]    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   Osteopenia    Osteoporosis 10/02/2010    DEXA  2008 T score -2.1 DEXA 02-2009 Tscore - 3.3 Started fosamax 2009, switched to later to actonel to increase compliance    Pre-diabetes 12/17/2017   Triceps tendonitis 12/14/2015    Patient Active Problem List   Diagnosis Date Noted   Gallstone 06/07/2020   Aortic atherosclerosis ([place]) 06/07/2020   Cough 07/21/2019   Pre-diabetes 12/17/2017   Family history of early CAD 09/02/2017   Nicotine dependence with nicotine-induced disorder  06/19/2017   Medicare annual wellness visit, subsequent 08/08/2016   Osteoarthritis 02/22/2016   Triceps tendonitis 12/14/2015   Heart murmur 07/06/2014   Carotid artery disease ([place]) 05/21/2014   COPD (chronic obstructive pulmonary disease) ([place]) 10/02/2010   Osteoporosis 10/02/2010   Encounter for well adult exam with abnormal findings 10/02/2010   Hyperlipidemia 02/17/2009   Hypothyroidism 02/10/2007   Anxiety and depression 02/10/2007   Allergic rhinitis 02/10/2007   HEMATURIA, MICROSCOPIC, HX OF 02/10/2007    Past Surgical History:  Procedure Laterality Date   APPENDECTOMY     CARPAL TUNNEL RELEASE     NASAL SEPTUM SURGERY     THYROIDECTOMY      OB History   No obstetric history on file.      Home Medications    Prior to Admission medications   Medication Sig Start Date End Date Taking? Authorizing Provider  albuterol (VENTOLIN HFA) 108 (90 Base) MCG/ACT inhaler Inhale 1-2 puffs into the lungs every 6 (six) hours as needed for wheezing or shortness of breath. 11/01/20  Yes Volney American, PA-C  atorvastatin (LIPITOR) 80 MG tablet TAKE 1 TABLET (80 MG TOTAL) BY MOUTH DAILY AT 6 PM. PT MUST KEEP UPCOMING APPT IN AUG TO GET FURTHER REFILLS 01/24/20  Yes Imogene Burn, PA-C  Cholecalciferol (VITAMIN D3 PO) Take 2,000 Units by mouth daily.    Yes [provider]  lidocaine (XYLOCAINE) 2 %  solution Use as directed 10 mLs in the mouth or throat as needed for mouth pain. 11/01/20  Yes Volney American, PA-C  promethazine-dextromethorphan (PROMETHAZINE-DM) 6.25-15 MG/5ML syrup Take 5 mLs by mouth 4 (four) times daily as needed for cough. 11/01/20  Yes Volney American, PA-C  SYNTHROID 88 MCG tablet TAKE ONE TABLET BY MOUTH ONE TIME DAILY BEFORE BREAKFAST 09/05/20  Yes Biagio Borg, MD  aspirin 81 MG tablet Take 81 mg by mouth daily.    [provider]  azithromycin (ZITHROMAX Z-PAK) 250 MG tablet 2 tab by mouth day 1, then 1 per day Patient not  taking: No sig reported 07/21/19   Biagio Borg, MD  calcium gluconate 500 MG tablet Take 500 mg by mouth daily.    [provider]  levocetirizine (XYZAL) 5 MG tablet TAKE 1 TABLET BY MOUTH EVERY DAY IN THE EVENING 04/19/19   Biagio Borg, MD  LORazepam (ATIVAN) 0.5 MG tablet TAKE 1 TABLET BY MOUTH TWICE A DAY AS NEEDED FOR ANXIETY 06/07/20   Biagio Borg, MD    Family History Family History  Problem Relation Age of Onset   Heart attack Father    Diabetes Mother    Hypertension Mother    Colon cancer Other        uncle, age?   Breast cancer Neg Hx    Cancer Neg Hx    Heart disease Neg Hx    Stroke Neg Hx     Social History Social History   Tobacco Use   Smoking status: Every Day    Packs/day: 0.75    Years: 58.00    Pack years: 43.50    Types: Cigarettes    Start date: 04/29/2012   Smokeless tobacco: Never   Tobacco comments:    1/2 ppd  Substance Use Topics   Alcohol use: No    Alcohol/week: 0.0 standard drinks   Drug use: No     Allergies   Amoxil [amoxicillin]   Review of Systems Review of Systems Per HPI  Physical Exam Triage Vital Signs ED Triage Vitals  Enc Vitals Group     BP 11/01/20 1413 130/76     Pulse Rate 11/01/20 1413 65     Resp 11/01/20 1413 15     Temp 11/01/20 1413 98.1 F (36.7 C)     Temp Source 11/01/20 1413 Oral     SpO2 11/01/20 1413 95 %     Weight --      Height --      Head Circumference --      Peak Flow --      Pain Score 11/01/20 1414 6     Pain Loc --      Pain Edu? --      Excl. in [place]? --    No data found.  Updated Vital Signs BP 130/76 (BP Location: Left Arm)   Pulse 65   Temp 98.1 F (36.7 C) (Oral)   Resp 15   SpO2 95%   Visual Acuity Right Eye Distance:   Left Eye Distance:   Bilateral Distance:    Right Eye Near:   Left Eye Near:    Bilateral Near:     Physical Exam Vitals and nursing note reviewed.  Constitutional:      Appearance: Normal appearance. She is not ill-appearing.  HENT:      Head: Atraumatic.     Right Ear: Tympanic membrane normal.     Left Ear: Tympanic  membrane normal.     Nose: Rhinorrhea present.     Mouth/Throat:     Mouth: Mucous membranes are moist.     Pharynx: Posterior oropharyngeal erythema present. No oropharyngeal exudate.  Eyes:     Extraocular Movements: Extraocular movements intact.     Conjunctiva/sclera: Conjunctivae normal.  Cardiovascular:     Rate and Rhythm: Normal rate and regular rhythm.     Heart sounds: Normal heart sounds.  Pulmonary:     Effort: Pulmonary effort is normal. No respiratory distress.     Breath sounds: Normal breath sounds. No wheezing or rales.  Abdominal:     General: Bowel sounds are normal.     Palpations: Abdomen is soft.     Tenderness: There is no abdominal tenderness. There is no right CVA tenderness or guarding.  Musculoskeletal:        General: Normal range of motion.     Cervical back: Normal range of motion and neck supple.  Skin:    General: Skin is warm and dry.     Findings: No rash.  Neurological:     Mental Status: She is alert and oriented to person, place, and time.  Psychiatric:        Mood and Affect: Mood normal.        Thought Content: Thought content normal.        Judgment: Judgment normal.     UC Treatments / Results  Labs (all labs ordered are listed, but only abnormal results are displayed) Labs Reviewed  POCT RAPID STREP A (OFFICE)    EKG   Radiology No results found.  Procedures Procedures (including critical care time)  Medications Ordered in UC Medications - No data to display  Initial Impression / Assessment and Plan / UC Course  I have reviewed the triage vital signs and the nursing notes.  Pertinent labs & imaging results that were available during my care of the patient were reviewed by me and considered in my medical decision making (see chart for details).     Exam and vitals overall very reassuring, suspect viral illness.  Will retest for  COVID and quarantine until results return.  Discussed treatment with albuterol inhaler, viscous lidocaine, Phenergan DM for cough.  Continue over-the-counter and supportive home measures.  Return for acutely worsening symptoms.  Final Clinical Impressions(s) / UC Diagnoses   Final diagnoses:  Viral URI with cough   Discharge Instructions   None    ED Prescriptions     Medication Sig Dispense Auth. Provider   promethazine-dextromethorphan (PROMETHAZINE-DM) 6.25-15 MG/5ML syrup Take 5 mLs by mouth 4 (four) times daily as needed for cough. 100 mL Volney American, PA-C   albuterol (VENTOLIN HFA) 108 (90 Base) MCG/ACT inhaler Inhale 1-2 puffs into the lungs every 6 (six) hours as needed for wheezing or shortness of breath. 18 g Volney American, PA-C   lidocaine (XYLOCAINE) 2 % solution Use as directed 10 mLs in the mouth or throat as needed for mouth pain. 100 mL Volney American, [place]      PDMP not reviewed this encounter.   Volney American, [place] 11/01/20 1437

## 2020-11-01 NOTE — ED Triage Notes (Signed)
Patient c/o sore throat x 2 days.   Patient denies fever at home.   Patient endorses productive cough w/ " yellow to white" sputum.   Patient endorses hoarseness.   Patient took an at home COVID-19 test and reports negative test result.   Patient has used OTC " cough syrup, allergy medication, and cough drops" w/ no relief of symptoms.

## 2020-11-02 LAB — SARS-COV-2, NAA 2 DAY TAT

## 2020-11-02 LAB — NOVEL CORONAVIRUS, NAA: SARS-CoV-2, NAA: NOT DETECTED

## 2020-11-03 ENCOUNTER — Ambulatory Visit: Payer: Medicare Other

## 2020-11-04 LAB — CULTURE, GROUP A STREP (THRC)

## 2020-11-09 DIAGNOSIS — L812 Freckles: Secondary | ICD-10-CM | POA: Diagnosis not present

## 2020-11-09 DIAGNOSIS — L820 Inflamed seborrheic keratosis: Secondary | ICD-10-CM | POA: Diagnosis not present

## 2020-11-09 DIAGNOSIS — L720 Epidermal cyst: Secondary | ICD-10-CM | POA: Diagnosis not present

## 2020-11-09 DIAGNOSIS — L821 Other seborrheic keratosis: Secondary | ICD-10-CM | POA: Diagnosis not present

## 2020-11-09 DIAGNOSIS — D225 Melanocytic nevi of trunk: Secondary | ICD-10-CM | POA: Diagnosis not present

## 2020-12-06 ENCOUNTER — Encounter: Payer: Self-pay | Admitting: Internal Medicine

## 2020-12-06 ENCOUNTER — Ambulatory Visit (INDEPENDENT_AMBULATORY_CARE_PROVIDER_SITE_OTHER): Payer: Medicare Other | Admitting: Internal Medicine

## 2020-12-06 ENCOUNTER — Other Ambulatory Visit: Payer: Self-pay

## 2020-12-06 VITALS — BP 132/80 | HR 68 | Temp 98.1°F | Ht 63.0 in | Wt 156.0 lb

## 2020-12-06 DIAGNOSIS — E039 Hypothyroidism, unspecified: Secondary | ICD-10-CM

## 2020-12-06 DIAGNOSIS — J410 Simple chronic bronchitis: Secondary | ICD-10-CM

## 2020-12-06 DIAGNOSIS — R251 Tremor, unspecified: Secondary | ICD-10-CM

## 2020-12-06 DIAGNOSIS — R7303 Prediabetes: Secondary | ICD-10-CM | POA: Diagnosis not present

## 2020-12-06 DIAGNOSIS — R079 Chest pain, unspecified: Secondary | ICD-10-CM

## 2020-12-06 DIAGNOSIS — F17219 Nicotine dependence, cigarettes, with unspecified nicotine-induced disorders: Secondary | ICD-10-CM | POA: Diagnosis not present

## 2020-12-06 DIAGNOSIS — E782 Mixed hyperlipidemia: Secondary | ICD-10-CM

## 2020-12-06 LAB — BASIC METABOLIC PANEL
BUN: 15 mg/dL (ref 6–23)
CO2: 28 mEq/L (ref 19–32)
Calcium: 9.5 mg/dL (ref 8.4–10.5)
Chloride: 104 mEq/L (ref 96–112)
Creatinine, Ser: 0.73 mg/dL (ref 0.40–1.20)
GFR: 77.65 mL/min (ref >60.00)
Glucose, Bld: 90 mg/dL (ref 70–99)
Potassium: 4.3 mEq/L (ref 3.5–5.1)
Sodium: 140 mEq/L (ref 135–145)

## 2020-12-06 LAB — TSH: TSH: 0.3 u[IU]/mL — ABNORMAL LOW (ref 0.35–5.50)

## 2020-12-06 LAB — HEPATIC FUNCTION PANEL
ALT: 23 U/L (ref 0–35)
AST: 22 U/L (ref 0–37)
Albumin: 4.1 g/dL (ref 3.5–5.2)
Alkaline Phosphatase: 35 U/L — ABNORMAL LOW (ref 39–117)
Bilirubin, Direct: 0.1 mg/dL (ref 0.0–0.3)
Total Bilirubin: 0.6 mg/dL (ref 0.2–1.2)
Total Protein: 7.3 g/dL (ref 6.0–8.3)

## 2020-12-06 LAB — LIPID PANEL
Cholesterol: 113 mg/dL (ref 0–200)
HDL: 41.3 mg/dL (ref >39.00)
LDL Cholesterol: 53 mg/dL (ref 0–99)
NonHDL: 71.62
Total CHOL/HDL Ratio: 3
Triglycerides: 94 mg/dL (ref 0.0–149.0)
VLDL: 18.8 mg/dL (ref 0.0–40.0)

## 2020-12-06 LAB — HEMOGLOBIN A1C: Hgb A1c MFr Bld: 6.1 % (ref 4.6–6.5)

## 2020-12-06 LAB — T4, FREE: Free T4: 1.18 ng/dL (ref 0.60–1.60)

## 2020-12-06 NOTE — Progress Notes (Signed)
Patient ID: Meagan Holmes, female   DOB: Nov 02, 1940, 80 y.o.   MRN: [id number]        Chief Complaint: follow up HTN, HLD and hyperglycemia , low thyroid, copd       HPI:  Meagan Holmes is a 80 y.o. female here overall doing ok, Pt denies increased sob or doe, wheezing, orthopnea, PND, increased LE swelling, palpitations, dizziness or syncope.    Pt denies polydipsia, polyuria, or new focal neuro s/s.   Pt denies fever, wt loss, night sweats, loss of appetite, or other constitutional symptoms   Quit smoking July 4.  Gained 4 lbs.  Trying to not go back to it.  Gets shaky in the afternoons since then. None in the AM r other times.  Denies hyper or hypo thyroid symptoms such as voice, skin or hair change. s/p bilateral cataract recently as well with much improved vision.  Trying to follow lower chol diet.  No other new complaints Wt Readings from Last 3 Encounters:  12/06/20 156 lb (70.8 kg)  06/07/20 156 lb (70.8 kg)  12/14/19 152 lb 9.6 oz (69.2 kg)   BP Readings from Last 3 Encounters:  12/06/20 132/80  11/01/20 130/76  06/07/20 124/68         Past Medical History:  Diagnosis Date   Allergic rhinitis    a lot better after septoplasty- remote   Allergic rhinitis 02/10/2007   Qualifier: Diagnosis of  By: Larose Kells MD, [place]    Anxiety and depression 02/10/2007   Carotid artery disease ([place]) 05/21/2014   Carotid US 11/2018: Bilateral ICA 1-39   COPD (chronic obstructive pulmonary disease) ([place]) 10/02/2010   Family history of early CAD 09/02/2017   Heart murmur 07/06/2014   Hematuria 2004   (-) CT abd-pelvis (-) cystoscopy   Hyperlipidemia 02/17/2009   Qualifier: Diagnosis of  By: Larose Kells MD, [place]    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   Osteopenia    Osteoporosis 10/02/2010    DEXA  2008 T score -2.1 DEXA 02-2009 Tscore - 3.3 Started fosamax 2009, switched to later to actonel to increase compliance    Pre-diabetes 12/17/2017   Triceps tendonitis  12/14/2015   Past Surgical History:  Procedure Laterality Date   APPENDECTOMY     CARPAL TUNNEL RELEASE     NASAL SEPTUM SURGERY     THYROIDECTOMY      reports that she quit smoking about 5 weeks ago. Her smoking use included cigarettes. She started smoking about 8 years ago. She has a 43.50 pack-year smoking history. She has never used smokeless tobacco. She reports that she does not drink alcohol and does not use drugs. family history includes Colon cancer in an other family member; Diabetes in her mother; Heart attack in her father; Hypertension in her mother. Allergies  Allergen Reactions   Amoxil [Amoxicillin] Hives   Current Outpatient Medications on File Prior to Visit  Medication Sig Dispense Refill   albuterol (VENTOLIN HFA) 108 (90 Base) MCG/ACT inhaler Inhale 1-2 puffs into the lungs every 6 (six) hours as needed for wheezing or shortness of breath. 18 g 0   aspirin 81 MG tablet Take 81 mg by mouth daily.     atorvastatin (LIPITOR) 80 MG tablet TAKE 1 TABLET (80 MG TOTAL) BY MOUTH DAILY AT 6 PM. PT MUST KEEP UPCOMING APPT IN AUG TO GET FURTHER REFILLS 90 tablet 3   calcium gluconate 500 MG tablet Take 500 mg by  mouth daily.     Cholecalciferol (VITAMIN D3 PO) Take 2,000 Units by mouth daily.      levocetirizine (XYZAL) 5 MG tablet TAKE 1 TABLET BY MOUTH EVERY DAY IN THE EVENING 30 tablet 1   LORazepam (ATIVAN) 0.5 MG tablet TAKE 1 TABLET BY MOUTH TWICE A DAY AS NEEDED FOR ANXIETY 60 tablet 1   SYNTHROID 88 MCG tablet TAKE ONE TABLET BY MOUTH ONE TIME DAILY BEFORE BREAKFAST 90 tablet 0   azithromycin (ZITHROMAX Z-PAK) 250 MG tablet 2 tab by mouth day 1, then 1 per day (Patient not taking: No sig reported) 6 tablet 1   lidocaine (XYLOCAINE) 2 % solution Use as directed 10 mLs in the mouth or throat as needed for mouth pain. (Patient not taking: Reported on 12/06/2020) 100 mL 0   promethazine-dextromethorphan (PROMETHAZINE-DM) 6.25-15 MG/5ML syrup Take 5 mLs by mouth 4 (four) times  daily as needed for cough. (Patient not taking: Reported on 12/06/2020) 100 mL 0   No current facility-administered medications on file prior to visit.        ROS:  All others reviewed and negative.  Objective        PE:  BP 132/80 (BP Location: Left Arm, Patient Position: Sitting, Cuff Size: Normal)   Pulse 68   Temp 98.1 F (36.7 C) (Oral)   Ht '5\' 3"'$  (1.6 m)   Wt 156 lb (70.8 kg)   SpO2 95%   BMI 27.63 kg/m                 Constitutional: Pt appears in NAD               HENT: Head: NCAT.                Right Ear: External ear normal.                 Left Ear: External ear normal.                Eyes: . Pupils are equal, round, and reactive to light. Conjunctivae and EOM are normal               Nose: without d/c or deformity               Neck: Neck supple. Gross normal ROM               Cardiovascular: Normal rate and regular rhythm.                 Pulmonary/Chest: Effort normal and breath sounds without rales or wheezing.                Abd:  Soft, NT, ND, + BS, no organomegaly               Neurological: Pt is alert. At baseline orientation, motor grossly intact               Skin: Skin is warm. No rashes, no other new lesions, LE edema - none               Psychiatric: Pt behavior is normal without agitation   Micro: none  Cardiac tracings I have personally interpreted today:  none  Pertinent Radiological findings (summarize): none   Lab Results  Component Value Date   WBC 7.7 06/07/2020   HGB 14.1 06/07/2020   HCT 41.5 06/07/2020   PLT 225.0 06/07/2020   GLUCOSE 90 12/06/2020   CHOL 113 12/06/2020  TRIG 94.0 12/06/2020   HDL 41.30 12/06/2020   LDLDIRECT 171.2 03/19/2012   LDLCALC 53 12/06/2020   ALT 23 12/06/2020   AST 22 12/06/2020   NA 140 12/06/2020   K 4.3 12/06/2020   CL 104 12/06/2020   CREATININE 0.73 12/06/2020   BUN 15 12/06/2020   CO2 28 12/06/2020   TSH 0.30 (L) 12/06/2020   HGBA1C 6.1 12/06/2020   Assessment/Plan:  Meagan Holmes is a  80 y.o. White or Caucasian [1] female with  has a past medical history of Allergic rhinitis, Allergic rhinitis (02/10/2007), Anxiety and depression (02/10/2007), Carotid artery disease ([place]) (05/21/2014), COPD (chronic obstructive pulmonary disease) ([place]) (10/02/2010), Family history of early CAD (09/02/2017), Heart murmur (07/06/2014), Hematuria (2004), Hyperlipidemia (02/17/2009), Hypothyroidism, Nicotine dependence with nicotine-induced disorder (06/19/2017), Osteopenia, Osteoporosis (10/02/2010), Pre-diabetes (12/17/2017), and Triceps tendonitis (12/14/2015).  COPD (chronic obstructive pulmonary disease) (H[facility]) Stable, overall doing well, to continue in haler use -  Albuterol hfa prn  Pre-diabetes Lab Results  Component Value Date   HGBA1C 6.1 12/06/2020   Stable, pt to continue current medical treatment  - diet   Nicotine dependence with nicotine-induced disorder Now former smoker, urged pt to continue to abstain  Hypothyroidism Lab Results  Component Value Date   TSH 0.30 (L) 12/06/2020   Stable, pt to continue levothyroxinwe   Hyperlipidemia Lab Results  Component Value Date   LDLCALC 53 12/06/2020   Stable, pt to continue current statin lipitor 80   Shakiness Worsening recently, ? Anxiety, o/w etiology unclear, cont current med tx ativan prn, check TFTs  Followup: Return in about 6 months (around 06/08/2021).  Cathlean Cower, MD 12/10/2020 9:09 PM [facility]

## 2020-12-06 NOTE — Patient Instructions (Signed)
Please continue to abstain from smoking  Please continue all other medications as before, and refills have been done if requested.  Please have the pharmacy call with any other refills you may need.  Please continue your efforts at being more active, low cholesterol diet, and weight control.  Please keep your appointments with your specialists as you may have planned  Please go to the LAB at the blood drawing area for the tests to be done  You will be contacted by phone if any changes need to be made immediately.  Otherwise, you will receive a letter about your results with an explanation, but please check with MyChart first.  Please remember to sign up for MyChart if you have not done so, as this will be important to you in the future with finding out test results, communicating by private email, and scheduling acute appointments online when needed.  Please make an Appointment to return in 6 months, or sooner if needed

## 2020-12-10 ENCOUNTER — Encounter: Payer: Self-pay | Admitting: Internal Medicine

## 2020-12-10 DIAGNOSIS — R251 Tremor, unspecified: Secondary | ICD-10-CM | POA: Insufficient documentation

## 2020-12-10 NOTE — Assessment & Plan Note (Signed)
Now former smoker, urged pt to continue to abstain

## 2020-12-10 NOTE — Assessment & Plan Note (Signed)
Stable, overall doing well, to continue in haler use -  Albuterol hfa prn

## 2020-12-10 NOTE — Assessment & Plan Note (Signed)
Lab Results  Component Value Date   TSH 0.30 (L) 12/06/2020   Stable, pt to continue levothyroxinwe

## 2020-12-10 NOTE — Assessment & Plan Note (Addendum)
Worsening recently, ? Anxiety, o/w etiology unclear, cont current med tx ativan prn, check TFTs

## 2020-12-10 NOTE — Assessment & Plan Note (Signed)
Lab Results  Component Value Date   LDLCALC 53 12/06/2020   Stable, pt to continue current statin lipitor 80

## 2020-12-10 NOTE — Assessment & Plan Note (Signed)
Lab Results  Component Value Date   HGBA1C 6.1 12/06/2020   Stable, pt to continue current medical treatment  - diet

## 2020-12-11 ENCOUNTER — Telehealth: Payer: Self-pay | Admitting: Internal Medicine

## 2020-12-11 NOTE — Telephone Encounter (Signed)
Patient would like for nurse to call her back  Reviewing results from previous labs on Mychart & has some questions  Please call [phone]

## 2020-12-12 NOTE — Telephone Encounter (Signed)
Spoke with patient to review lab results and answer any questions needed. Patient verbalizes understanding

## 2020-12-14 ENCOUNTER — Other Ambulatory Visit: Payer: Self-pay | Admitting: Internal Medicine

## 2020-12-14 DIAGNOSIS — E039 Hypothyroidism, unspecified: Secondary | ICD-10-CM

## 2020-12-14 NOTE — Telephone Encounter (Signed)
Please refill as per office routine med refill policy (all routine meds refilled for 3 mo or monthly per pt preference up to one year from last visit, then month to month grace period for 3 mo, then further med refills will have to be denied)  

## 2021-01-08 ENCOUNTER — Encounter: Payer: Self-pay | Admitting: Emergency Medicine

## 2021-01-08 ENCOUNTER — Other Ambulatory Visit: Payer: Self-pay

## 2021-01-08 ENCOUNTER — Ambulatory Visit
Admission: EM | Admit: 2021-01-08 | Discharge: 2021-01-08 | Disposition: A | Discharge status: Home / Self Care | Payer: Medicare Other

## 2021-01-08 DIAGNOSIS — T148XXA Other injury of unspecified body region, initial encounter: Secondary | ICD-10-CM | POA: Diagnosis not present

## 2021-01-08 NOTE — ED Provider Notes (Signed)
[place]   MRN: [id number] DOB: November 15, 1940  Subjective:   Meagan Holmes is a 80 y.o. female presenting for 1 week history of a persistent scab over the right side of her thoracic back.  Patient states that symptoms started with really irritating itching over the area.  She took a wooden back scratcher and states that she scratched until she did not feel the itch anymore which was quite some time.  She applied Neosporin once without any difference in her symptoms.  Denies any ongoing itching, fever, redness, drainage of pus or bleeding, swelling.  No current facility-administered medications for this encounter.  Current Outpatient Medications:    albuterol (VENTOLIN HFA) 108 (90 Base) MCG/ACT inhaler, Inhale 1-2 puffs into the lungs every 6 (six) hours as needed for wheezing or shortness of breath., Disp: 18 g, Rfl: 0   aspirin 81 MG tablet, Take 81 mg by mouth daily., Disp: , Rfl:    atorvastatin (LIPITOR) 80 MG tablet, TAKE 1 TABLET (80 MG TOTAL) BY MOUTH DAILY AT 6 PM. PT MUST KEEP UPCOMING APPT IN AUG TO GET FURTHER REFILLS, Disp: 90 tablet, Rfl: 3   calcium gluconate 500 MG tablet, Take 500 mg by mouth daily., Disp: , Rfl:    Cholecalciferol (VITAMIN D3 PO), Take 2,000 Units by mouth daily. , Disp: , Rfl:    levocetirizine (XYZAL) 5 MG tablet, TAKE 1 TABLET BY MOUTH EVERY DAY IN THE EVENING, Disp: 30 tablet, Rfl: 1   LORazepam (ATIVAN) 0.5 MG tablet, TAKE 1 TABLET BY MOUTH TWICE A DAY AS NEEDED FOR ANXIETY, Disp: 60 tablet, Rfl: 1   SYNTHROID 88 MCG tablet, TAKE ONE TABLET BY MOUTH ONE TIME DAILY before breakfast, Disp: 90 tablet, Rfl: 0   azithromycin (ZITHROMAX Z-PAK) 250 MG tablet, 2 tab by mouth day 1, then 1 per day (Patient not taking: No sig reported), Disp: 6 tablet, Rfl: 1   lidocaine (XYLOCAINE) 2 % solution, Use as directed 10 mLs in the mouth or throat as needed for mouth pain. (Patient not taking: Reported on 12/06/2020), Disp: 100 mL, Rfl: 0    promethazine-dextromethorphan (PROMETHAZINE-DM) 6.25-15 MG/5ML syrup, Take 5 mLs by mouth 4 (four) times daily as needed for cough. (Patient not taking: Reported on 12/06/2020), Disp: 100 mL, Rfl: 0   Allergies  Allergen Reactions   Amoxil [Amoxicillin] Hives    Past Medical History:  Diagnosis Date   Allergic rhinitis    a lot better after septoplasty- remote   Allergic rhinitis 02/10/2007   Qualifier: Diagnosis of  By: Larose Kells MD, [place]    Anxiety and depression 02/10/2007   Carotid artery disease ([place]) 05/21/2014   Carotid US 11/2018: Bilateral ICA 1-39   COPD (chronic obstructive pulmonary disease) ([place]) 10/02/2010   Family history of early CAD 09/02/2017   Heart murmur 07/06/2014   Hematuria 2004   (-) CT abd-pelvis (-) cystoscopy   Hyperlipidemia 02/17/2009   Qualifier: Diagnosis of  By: Larose Kells MD, [place]    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   Osteopenia    Osteoporosis 10/02/2010    DEXA  2008 T score -2.1 DEXA 02-2009 Tscore - 3.3 Started fosamax 2009, switched to later to actonel to increase compliance    Pre-diabetes 12/17/2017   Triceps tendonitis 12/14/2015     Past Surgical History:  Procedure Laterality Date   APPENDECTOMY     CARPAL TUNNEL RELEASE     NASAL SEPTUM SURGERY     THYROIDECTOMY  Family History  Problem Relation Age of Onset   Heart attack Father    Diabetes Mother    Hypertension Mother    Colon cancer Other        uncle, age?   Breast cancer Neg Hx    Cancer Neg Hx    Heart disease Neg Hx    Stroke Neg Hx     Social History   Tobacco Use   Smoking status: Former    Packs/day: 0.75    Years: 58.00    Pack years: 43.50    Types: Cigarettes    Start date: 04/29/2012    Quit date: 10/30/2020    Years since quitting: 0.1   Smokeless tobacco: Never   Tobacco comments:    1/2 ppd  Substance Use Topics   Alcohol use: No    Alcohol/week: 0.0 standard drinks   Drug use: No    ROS   Objective:    Vitals: BP 131/65 (BP Location: Right Arm)   Pulse (!) 55   Temp 98 F (36.7 C) (Oral)   Ht '5\' 3"'$  (1.6 m)   Wt 156 lb (70.8 kg)   SpO2 97%   BMI 27.63 kg/m   Physical Exam Constitutional:      General: She is not in acute distress.    Appearance: Normal appearance. She is well-developed. She is not ill-appearing, toxic-appearing or diaphoretic.  HENT:     Head: Normocephalic and atraumatic.     Nose: Nose normal.     Mouth/Throat:     Mouth: Mucous membranes are moist.     Pharynx: Oropharynx is clear.  Eyes:     General: No scleral icterus.       Right eye: No discharge.        Left eye: No discharge.     Extraocular Movements: Extraocular movements intact.     Conjunctiva/sclera: Conjunctivae normal.     Pupils: Pupils are equal, round, and reactive to light.  Cardiovascular:     Rate and Rhythm: Normal rate.  Pulmonary:     Effort: Pulmonary effort is normal.  Skin:    General: Skin is warm and dry.       Neurological:     General: No focal deficit present.     Mental Status: She is alert and oriented to person, place, and time.  Psychiatric:        Mood and Affect: Mood normal.        Behavior: Behavior normal.        Thought Content: Thought content normal.        Judgment: Judgment normal.     Assessment and Plan :   PDMP not reviewed this encounter.  1. Abrasion     Counseled on general wound care.  Recommended supportive care.  No signs of an infectious process.  No signs of shingles, fungal infection, abscess or cyst.  Patient is largely asymptomatic but wanted to be evaluated.  Counseled patient on potential for adverse effects with medications prescribed/recommended today, ER and return-to-clinic precautions discussed, patient verbalized understanding.    Jaynee Eagles, PA-C 01/08/21 1228

## 2021-01-08 NOTE — ED Triage Notes (Signed)
Patient states she has a back scab on her back for over a week now.  Patient states that area is draining, itching.  Patient has applied Perxoide and Neosporin.

## 2021-01-10 ENCOUNTER — Ambulatory Visit: Payer: Medicare Other | Admitting: Physician Assistant

## 2021-01-18 DIAGNOSIS — Z1231 Encounter for screening mammogram for malignant neoplasm of breast: Secondary | ICD-10-CM | POA: Diagnosis not present

## 2021-01-18 LAB — HM MAMMOGRAPHY

## 2021-01-26 ENCOUNTER — Other Ambulatory Visit: Payer: Self-pay | Admitting: Physician Assistant

## 2021-02-16 ENCOUNTER — Other Ambulatory Visit: Payer: Self-pay | Admitting: Physician Assistant

## 2021-02-17 ENCOUNTER — Other Ambulatory Visit: Payer: Self-pay

## 2021-02-17 ENCOUNTER — Ambulatory Visit
Admission: EM | Admit: 2021-02-17 | Discharge: 2021-02-17 | Disposition: A | Discharge status: Home / Self Care | Payer: Medicare Other | Attending: Internal Medicine | Admitting: Internal Medicine

## 2021-02-17 ENCOUNTER — Encounter: Payer: Self-pay | Admitting: Emergency Medicine

## 2021-02-17 DIAGNOSIS — J029 Acute pharyngitis, unspecified: Secondary | ICD-10-CM

## 2021-02-17 DIAGNOSIS — J069 Acute upper respiratory infection, unspecified: Secondary | ICD-10-CM

## 2021-02-17 LAB — POCT RAPID STREP A (OFFICE): Rapid Strep A Screen: NEGATIVE

## 2021-02-17 MED ORDER — PROMETHAZINE-DM 6.25-15 MG/5ML PO SYRP: 5.0000 mL | ORAL_SOLUTION | Freq: Four times a day (QID) | ORAL | 0 refills | Status: DC | PRN

## 2021-02-17 NOTE — Discharge Instructions (Addendum)
You likely having a viral upper respiratory infection. We recommended symptom control. I expect your symptoms to start improving in the next 1-2 weeks.   1. Take a daily allergy pill/anti-histamine like Zyrtec, Claritin, or Store brand consistently for 2 weeks  2. For congestion you may try an oral decongestant like Mucinex or Coricidin HBP. You may also try intranasal flonase nasal spray or saline irrigations (neti pot, sinus cleanse)  3. For your sore throat you may try cepacol lozenges, salt water gargles, throat spray. Treatment of congestion may also help your sore throat.  4. For cough you may try medication that was prescribed for you.  Please be advised that this medication can cause drowsiness.  5. Take Tylenol or Ibuprofen to help with pain/inflammation  6. Stay hydrated, drink plenty of fluids to keep throat coated and less irritated  Honey Tea For cough/sore throat try using a honey-based tea. Use 3 teaspoons of honey with juice squeezed from half lemon. Place shaved pieces of ginger into 1/2-1 cup of water and warm over stove top. Then mix the ingredients and repeat every 4 hours as needed.   Your rapid strep test was negative.  Throat culture and COVID-19 viral swab are pending.  We will call if they are positive.

## 2021-02-17 NOTE — ED Provider Notes (Signed)
[facility]    CSN: [id number] Arrival date & time: 02/17/21  1406      History   Chief Complaint Chief Complaint  Patient presents with   Sore Throat    HPI Meagan Holmes is a 80 y.o. female.   Patient presents with sore throat, fever, cough, nasal congestion that has been present for 2 days.  Cough is productive with clear sputum.  T-max at home was 100.  Family member had similar symptoms.  Denies chest pain or shortness of breath.  Patient is taking over-the-counter cough and cold medications with minimal improvement in symptoms.   Sore Throat   Past Medical History:  Diagnosis Date   Allergic rhinitis    a lot better after septoplasty- remote   Allergic rhinitis 02/10/2007   Qualifier: Diagnosis of  By: Larose Kells MD, [place]    Anxiety and depression 02/10/2007   Carotid artery disease ([place]) 05/21/2014   Carotid US 11/2018: Bilateral ICA 1-39   COPD (chronic obstructive pulmonary disease) ([place]) 10/02/2010   Family history of early CAD 09/02/2017   Heart murmur 07/06/2014   Hematuria 2004   (-) CT abd-pelvis (-) cystoscopy   Hyperlipidemia 02/17/2009   Qualifier: Diagnosis of  By: Larose Kells MD, [place]    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   Osteopenia    Osteoporosis 10/02/2010    DEXA  2008 T score -2.1 DEXA 02-2009 Tscore - 3.3 Started fosamax 2009, switched to later to actonel to increase compliance    Pre-diabetes 12/17/2017   Triceps tendonitis 12/14/2015    Patient Active Problem List   Diagnosis Date Noted   Shakiness 12/10/2020   Gallstone 06/07/2020   Aortic atherosclerosis ([place]) 06/07/2020   Cough 07/21/2019   Pre-diabetes 12/17/2017   Family history of early CAD 09/02/2017   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   Medicare annual wellness visit, subsequent 08/08/2016   Osteoarthritis 02/22/2016   Triceps tendonitis 12/14/2015   Heart murmur 07/06/2014   Carotid artery disease  ([place]) 05/21/2014   COPD (chronic obstructive pulmonary disease) ([place]) 10/02/2010   Osteoporosis 10/02/2010   Encounter for well adult exam with abnormal findings 10/02/2010   Hyperlipidemia 02/17/2009   Hypothyroidism 02/10/2007   Anxiety and depression 02/10/2007   Allergic rhinitis 02/10/2007   HEMATURIA, MICROSCOPIC, HX OF 02/10/2007    Past Surgical History:  Procedure Laterality Date   APPENDECTOMY     CARPAL TUNNEL RELEASE     NASAL SEPTUM SURGERY     THYROIDECTOMY      OB History   No obstetric history on file.      Home Medications    Prior to Admission medications   Medication Sig Start Date End Date Taking? Authorizing Provider  promethazine-dextromethorphan (PROMETHAZINE-DM) 6.25-15 MG/5ML syrup Take 5 mLs by mouth 4 (four) times daily as needed for cough. 02/17/21  Yes Vera Wishart, Michele Rockers, FNP  albuterol (VENTOLIN HFA) 108 (90 Base) MCG/ACT inhaler Inhale 1-2 puffs into the lungs every 6 (six) hours as needed for wheezing or shortness of breath. 11/01/20   Volney American, PA-C  aspirin 81 MG tablet Take 81 mg by mouth daily.    [provider]  atorvastatin (LIPITOR) 80 MG tablet Take 1 tablet (80 mg total) by mouth daily at 6 PM. Please keep upcoming appt in January 2023 with Cardiologist before anymore refills. Thank you Final Attempt 01/26/21   Richardson Dopp T, PA-C  azithromycin (ZITHROMAX Z-PAK) 250 MG tablet 2  tab by mouth day 1, then 1 per day Patient not taking: No sig reported 07/21/19   Biagio Borg, MD  calcium gluconate 500 MG tablet Take 500 mg by mouth daily.    [provider]  Cholecalciferol (VITAMIN D3 PO) Take 2,000 Units by mouth daily.     [provider]  levocetirizine (XYZAL) 5 MG tablet TAKE 1 TABLET BY MOUTH EVERY DAY IN THE EVENING 04/19/19   Biagio Borg, MD  lidocaine (XYLOCAINE) 2 % solution Use as directed 10 mLs in the mouth or throat as needed for mouth pain. Patient not taking: Reported on 12/06/2020  11/01/20   Volney American, PA-C  LORazepam (ATIVAN) 0.5 MG tablet TAKE 1 TABLET BY MOUTH TWICE A DAY AS NEEDED FOR ANXIETY 06/07/20   Biagio Borg, MD  SYNTHROID 88 MCG tablet TAKE ONE TABLET BY MOUTH ONE TIME DAILY before breakfast 12/15/20   Biagio Borg, MD    Family History Family History  Problem Relation Age of Onset   Heart attack Father    Diabetes Mother    Hypertension Mother    Colon cancer Other        uncle, age?   Breast cancer Neg Hx    Cancer Neg Hx    Heart disease Neg Hx    Stroke Neg Hx     Social History Social History   Tobacco Use   Smoking status: Former    Packs/day: 0.75    Years: 58.00    Pack years: 43.50    Types: Cigarettes    Start date: 04/29/2012    Quit date: 10/30/2020    Years since quitting: 0.3   Smokeless tobacco: Never   Tobacco comments:    1/2 ppd  Substance Use Topics   Alcohol use: No    Alcohol/week: 0.0 standard drinks   Drug use: No     Allergies   Amoxil [amoxicillin]   Review of Systems Review of Systems Per HPI  Physical Exam Triage Vital Signs ED Triage Vitals [02/17/21 1428]  Enc Vitals Group     BP 129/74     Pulse Rate 67     Resp 16     Temp 98.2 F (36.8 C)     Temp Source Oral     SpO2 97 %     Weight      Height      Head Circumference      Peak Flow      Pain Score 5     Pain Loc      Pain Edu?      Excl. in [place]?    No data found.  Updated Vital Signs BP 129/74 (BP Location: Left Arm)   Pulse 67   Temp 98.2 F (36.8 C) (Oral)   Resp 16   SpO2 97%   Visual Acuity Right Eye Distance:   Left Eye Distance:   Bilateral Distance:    Right Eye Near:   Left Eye Near:    Bilateral Near:     Physical Exam Constitutional:      General: She is not in acute distress.    Appearance: Normal appearance. She is not toxic-appearing.  HENT:     Head: Normocephalic and atraumatic.     Right Ear: Tympanic membrane and ear canal normal.     Left Ear: Tympanic membrane and ear canal  normal.     Nose: Congestion present.     Mouth/Throat:  Mouth: Mucous membranes are moist.     Pharynx: Posterior oropharyngeal erythema present.  Eyes:     Extraocular Movements: Extraocular movements intact.     Conjunctiva/sclera: Conjunctivae normal.     Pupils: Pupils are equal, round, and reactive to light.  Cardiovascular:     Rate and Rhythm: Normal rate and regular rhythm.     Pulses: Normal pulses.     Heart sounds: Normal heart sounds.  Pulmonary:     Effort: Pulmonary effort is normal. No respiratory distress.     Breath sounds: Normal breath sounds. No stridor. No wheezing or rhonchi.  Abdominal:     General: Abdomen is flat. Bowel sounds are normal.     Palpations: Abdomen is soft.  Musculoskeletal:        General: Normal range of motion.     Cervical back: Normal range of motion.  Skin:    General: Skin is warm and dry.  Neurological:     General: No focal deficit present.     Mental Status: She is alert and oriented to person, place, and time. Mental status is at baseline.  Psychiatric:        Mood and Affect: Mood normal.        Behavior: Behavior normal.     UC Treatments / Results  Labs (all labs ordered are listed, but only abnormal results are displayed) Labs Reviewed  CULTURE, GROUP A STREP ([place])  NOVEL CORONAVIRUS, NAA  POCT RAPID STREP A (OFFICE)    EKG   Radiology No results found.  Procedures Procedures (including critical care time)  Medications Ordered in UC Medications - No data to display  Initial Impression / Assessment and Plan / UC Course  I have reviewed the triage vital signs and the nursing notes.  Pertinent labs & imaging results that were available during my care of the patient were reviewed by me and considered in my medical decision making (see chart for details).     Patient presents with symptoms likely from a viral upper respiratory infection. Differential includes bacterial pneumonia, sinusitis, allergic  rhinitis, Covid 19. Do not suspect underlying cardiopulmonary process. Symptoms seem unlikely related to ACS, CHF or COPD exacerbations, pneumonia, pneumothorax. Patient is nontoxic appearing and not in need of emergent medical intervention.  Rapid strep test was negative.  Throat culture and COVID-19 viral swab are pending.  Recommended symptom control with over the counter medications: Daily oral anti-histamine, Oral decongestant or IN corticosteroid, saline irrigations, cepacol lozenges, Robitussin, Delsym, honey tea.  Patient requesting prescription cough medication.  Will prescribe promethazine DM as patient has taken this safely before.  Although, patient was advised that this can cause drowsiness and is not recommended with patient's age.  Patient voiced understanding and accepted risk.  Return if symptoms fail to improve in 1-2 weeks or you develop shortness of breath, chest pain, severe headache. Patient states understanding and is agreeable.  Discharged with PCP followup.  Final Clinical Impressions(s) / UC Diagnoses   Final diagnoses:  Viral upper respiratory tract infection with cough  Sore throat     Discharge Instructions      You likely having a viral upper respiratory infection. We recommended symptom control. I expect your symptoms to start improving in the next 1-2 weeks.   1. Take a daily allergy pill/anti-histamine like Zyrtec, Claritin, or Store brand consistently for 2 weeks  2. For congestion you may try an oral decongestant like Mucinex or Coricidin HBP. You may also try intranasal flonase  nasal spray or saline irrigations (neti pot, sinus cleanse)  3. For your sore throat you may try cepacol lozenges, salt water gargles, throat spray. Treatment of congestion may also help your sore throat.  4. For cough you may try medication that was prescribed for you.  Please be advised that this medication can cause drowsiness.  5. Take Tylenol or Ibuprofen to help with  pain/inflammation  6. Stay hydrated, drink plenty of fluids to keep throat coated and less irritated  Honey Tea For cough/sore throat try using a honey-based tea. Use 3 teaspoons of honey with juice squeezed from half lemon. Place shaved pieces of ginger into 1/2-1 cup of water and warm over stove top. Then mix the ingredients and repeat every 4 hours as needed.   Your rapid strep test was negative.  Throat culture and COVID-19 viral swab are pending.  We will call if they are positive.     ED Prescriptions     Medication Sig Dispense Auth. Provider   promethazine-dextromethorphan (PROMETHAZINE-DM) 6.25-15 MG/5ML syrup Take 5 mLs by mouth 4 (four) times daily as needed for cough. 118 mL Teodora Medici, [place]      PDMP not reviewed this encounter.   Teodora Medici, [place] 02/17/21 [phone]

## 2021-02-17 NOTE — ED Triage Notes (Signed)
Sore throat x 2 days with low-grade fever, also c/o cough and nasal congestion.

## 2021-02-18 LAB — NOVEL CORONAVIRUS, NAA: SARS-CoV-2, NAA: DETECTED — AB

## 2021-02-18 LAB — SARS-COV-2, NAA 2 DAY TAT

## 2021-02-21 LAB — CULTURE, GROUP A STREP (THRC)

## 2021-03-13 ENCOUNTER — Other Ambulatory Visit: Payer: Self-pay | Admitting: Internal Medicine

## 2021-03-13 DIAGNOSIS — E039 Hypothyroidism, unspecified: Secondary | ICD-10-CM

## 2021-03-13 NOTE — Telephone Encounter (Signed)
Please refill as per office routine med refill policy (all routine meds to be refilled for 3 mo or monthly (per pt preference) up to one year from last visit, then month to month grace period for 3 mo, then further med refills will have to be denied) ? ?

## 2021-04-04 ENCOUNTER — Telehealth: Payer: Self-pay | Admitting: Internal Medicine

## 2021-04-04 DIAGNOSIS — F32A Depression, unspecified: Secondary | ICD-10-CM

## 2021-04-04 DIAGNOSIS — F419 Anxiety disorder, unspecified: Secondary | ICD-10-CM

## 2021-04-04 MED ORDER — LORAZEPAM 0.5 MG PO TABS: ORAL_TABLET | ORAL | 1 refills | Status: DC

## 2021-04-04 NOTE — Telephone Encounter (Signed)
1.Medication Requested: LORazepam (ATIVAN) 0.5 MG tablet  2. Pharmacy (Name, Street, [place]): [id number] - [place], [place]  3. On Med List: yes   4. Last Visit with PCP: 12-06-2020  5. Next visit date with PCP: n/a   Agent: Please be advised that RX refills may take up to 3 business days. We ask that you follow-up with your pharmacy.

## 2021-04-12 DIAGNOSIS — H02831 Dermatochalasis of right upper eyelid: Secondary | ICD-10-CM | POA: Diagnosis not present

## 2021-04-12 DIAGNOSIS — Z961 Presence of intraocular lens: Secondary | ICD-10-CM | POA: Diagnosis not present

## 2021-04-12 DIAGNOSIS — H18413 Arcus senilis, bilateral: Secondary | ICD-10-CM | POA: Diagnosis not present

## 2021-04-12 DIAGNOSIS — H40013 Open angle with borderline findings, low risk, bilateral: Secondary | ICD-10-CM | POA: Diagnosis not present

## 2021-05-04 ENCOUNTER — Other Ambulatory Visit: Payer: Self-pay | Admitting: Physician Assistant

## 2021-05-10 DIAGNOSIS — I2510 Atherosclerotic heart disease of native coronary artery without angina pectoris: Secondary | ICD-10-CM | POA: Insufficient documentation

## 2021-05-10 DIAGNOSIS — I2584 Coronary atherosclerosis due to calcified coronary lesion: Secondary | ICD-10-CM | POA: Insufficient documentation

## 2021-05-10 NOTE — Progress Notes (Signed)
Cardiology Office Note:    Date:  05/11/2021   ID:  Meagan Holmes, DOB 1940-10-09, MRN [id number]  PCP:  Biagio Borg, MD  [facility] Providers Cardiologist:  Freada Bergeron, MD Cardiology APP:  Sh[facility]on [place]    Referring MD: Biagio Borg, MD   Chief Complaint:  F/u for CAD, carotid stenosis    Patient Profile: Carotid artery Dz Hx of LICA 81-82 >> regressed to 1-39 (08/2016) Coronary Calcification by Chest CT 5/19 Aortic atherosclerosis noted on CT Hyperlipidemia  Tobacco use COPD FHx CAD Post surgical Hypothryoidism Hx of hyperthyroidism   Prior CV studies Carotid US 12/09/2018 Bilateral ICA 1-39  Carotid US 12/29/2019 Bilateral ICA 1-39  Carotid US 10/01/17 Bilat ICA 1-39   Echocardiogram 09/10/17 EF 60-65, no RWMA, Gr 2 DD   Chest CT 08/29/17 IMPRESSION: 1. Lung-RADS 1, negative. Continue annual screening with low-dose chest CT without contrast in 12 months. 2. Aortic Atherosclerosis (ICD10-I70.0) and Emphysema (ICD10-J43.9). 3. Lad and left circumflex coronary artery calcifications. 4. Gallstone.   History of Present Illness:   Meagan Holmes is a 81 y.o. female with the above problem list.  She was last seen by Ermalinda Barrios, PA-C in 8/21.  She returns for follow-up.  She is here alone.  She has not had chest pain, shortness of breath, orthopnea, leg edema or syncope.  She quit smoking 10/30/2020.        Past Medical History:  Diagnosis Date   Allergic rhinitis    a lot better after septoplasty- remote   Allergic rhinitis 02/10/2007   Qualifier: Diagnosis of  By: Larose Kells MD, [place]    Anxiety and depression 02/10/2007   Carotid artery disease ([place]) 05/21/2014   Carotid US 11/2018: Bilateral ICA 1-39   COPD (chronic obstructive pulmonary disease) ([place]) 10/02/2010   Family history of early CAD 09/02/2017   Heart murmur 07/06/2014   Hematuria 2004   (-) CT abd-pelvis (-) cystoscopy   Hyperlipidemia 02/17/2009   Qualifier: Diagnosis of  By: Larose Kells MD,  [place]    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   aspirin 81 MG tablet Take 81 mg by mouth daily.   calcium gluconate 500 MG tablet Take 500 mg by mouth daily.   Cholecalciferol (VITAMIN D3 PO) Take 2,000 Units by mouth daily.    levocetirizine (XYZAL) 5 MG tablet TAKE 1 TABLET BY MOUTH EVERY DAY IN THE EVENING   LORazepam (ATIVAN) 0.5 MG tablet TAKE 1 TABLET BY MOUTH TWICE A DAY AS NEEDED FOR ANXIETY   promethazine-dextromethorphan (PROMETHAZINE-DM) 6.25-15 MG/5ML syrup Take 5 mLs by mouth 4 (four) times daily as needed for cough.   SYNTHROID 88 MCG tablet take 1 tablet by mouth once  a day before breakfast   [DISCONTINUED] atorvastatin (LIPITOR) 80 MG tablet TAKE 1 TABLET (80 MG TOTAL) BY MOUTH DAILY AT 6 PM. PLEASE KEEP UPCOMING APPT IN JANUARY 2023 WITH CARDIOLOGIST BEFORE ANYMORE REFILLS. THANK YOU FINAL ATTEMPT    Allergies:  Amoxil [amoxicillin]   Social History   Tobacco Use   Smoking status: Former    Packs/day: 0.75    Years: 58.00    Pack years: 43.50    Types: Cigarettes    Start date: 04/29/2012    Quit date: 10/30/2020    Years since quitting: 0.5   Smokeless tobacco: Never   Tobacco comments:    1/2 ppd  Substance Use Topics   Alcohol use: No    Alcohol/week: 0.0 standard drinks   Drug use: No    Family Hx: The patient's family history includes Colon cancer in an other family member; Diabetes in her mother; Heart attack in her father; Hypertension in her mother. There is no history of Breast  cancer, Cancer, Heart disease, or Stroke.  Review of Systems  Cardiovascular:  Negative for claudication.    EKGs/Labs/Other Test Reviewed:    EKG:  EKG is  ordered today.  The ekg ordered today demonstrates NSR, HR 69, normal axis, PACs, QTC 439, no ST-T wave changes, no change from prior tracing  Recent Labs: 06/07/2020: Hemoglobin 14.1; Platelets 225.0 12/06/2020: ALT 23; BUN 15; Creatinine, Ser 0.73; Potassium 4.3; Sodium 140; TSH 0.30   Recent Lipid Panel Recent Labs    12/06/20 1352  CHOL 113  TRIG 94.0  HDL 41.30  VLDL 18.8  LDLCALC 53     Risk Assessment/Calculations:         Physical Exam:    VS:  BP 123/70    Pulse 69    Ht 5\' 3"  (1.6 m)    Wt 159 lb 0.6 oz (72.1 kg)    SpO2 96%    BMI 28.17 kg/m     Wt Readings from Last 3 Encounters:  05/11/21 159 lb 0.6 oz (72.1 kg)  01/08/21 156 lb (70.8 kg)  12/06/20 156 lb (70.8 kg)    Constitutional:      Appearance: Healthy appearance. Not in distress.  Neck:     Vascular: No JVR. JVD normal.  Pulmonary:     Effort: Pulmonary effort is normal.     Breath sounds: No wheezing. No rales.  Cardiovascular:     Normal rate. Regular rhythm. Normal S1. Normal S2.      Murmurs: There is a grade 1/6 systolic murmur at the URSB.  Edema:    Peripheral edema absent.  Abdominal:     Palpations: Abdomen is soft.  Skin:    General: Skin is warm and dry.  Neurological:     Mental Status: Alert and oriented to person, place and time.     Cranial Nerves: Cranial nerves are intact.        ASSESSMENT & PLAN:   Carotid artery disease ([place]) She has had carotid stenosis up to 60-79% on the left.  Her most recent study in 11/2018 demonstrated bilateral ICA 1-39% stenosis.  As it has been more than 2 years since her last study, I have recommended we proceed with a follow-up carotid duplex.   Arrange carotid duplex  continue aspirin 81 mg daily, atorvastatin 80 mg daily.  Heart murmur She has a systolic murmur on exam.  It has been  over 3 years since her last echocardiogram.  Obtain follow-up echocardiogram.  Hyperlipidemia LDL optimal in August 2022.  Continue atorvastatin 80 mg daily.  Calcification of coronary artery She has not had anginal symptoms.  Continue aspirin 81 mg daily, atorvastatin 80 mg daily.  Follow-up in 1 year.  Aortic atherosclerosis (H[facility]) Continue aspirin, statin.  Dispo:  Return in about 1 year (around 05/11/2022) for Routine Follow Up, w/ Dr. Johney Frame, or Richardson Dopp, PA-C.   Medication Adjustments/Labs and Tests Ordered: Current medicines are reviewed at length with the patient today.  Concerns regarding medicines are outlined above.  Tests Ordered: Orders Placed This Encounter  Procedures   EKG 12-Lead   ECHOCARDIOGRAM COMPLETE   VAS US CAROTID   Medication Changes: Meds ordered this encounter  Medications   atorvastatin (LIPITOR) 80 MG tablet    Sig: Take 1 tablet (80 mg total) by mouth daily at 6 PM.    Dispense:  90 tablet    Refill:  3   Signed, Richardson Dopp, PA-C  05/11/2021 12:03 PM    [address], [place], [place]  [address] Phone: [phone]; Fax: [phone]

## 2021-05-11 ENCOUNTER — Other Ambulatory Visit: Payer: Self-pay

## 2021-05-11 ENCOUNTER — Encounter: Payer: Self-pay | Admitting: Physician Assistant

## 2021-05-11 ENCOUNTER — Ambulatory Visit: Payer: Medicare Other | Admitting: Physician Assistant

## 2021-05-11 VITALS — BP 123/70 | HR 69 | Ht 63.0 in | Wt 159.0 lb

## 2021-05-11 DIAGNOSIS — J449 Chronic obstructive pulmonary disease, unspecified: Secondary | ICD-10-CM

## 2021-05-11 DIAGNOSIS — R011 Cardiac murmur, unspecified: Secondary | ICD-10-CM | POA: Diagnosis not present

## 2021-05-11 DIAGNOSIS — I6523 Occlusion and stenosis of bilateral carotid arteries: Secondary | ICD-10-CM | POA: Diagnosis not present

## 2021-05-11 DIAGNOSIS — I2510 Atherosclerotic heart disease of native coronary artery without angina pectoris: Secondary | ICD-10-CM

## 2021-05-11 DIAGNOSIS — E782 Mixed hyperlipidemia: Secondary | ICD-10-CM

## 2021-05-11 DIAGNOSIS — I2584 Coronary atherosclerosis due to calcified coronary lesion: Secondary | ICD-10-CM

## 2021-05-11 DIAGNOSIS — I700 Atherosclerosis of aorta: Secondary | ICD-10-CM

## 2021-05-11 MED ORDER — ATORVASTATIN CALCIUM 80 MG PO TABS: 80.0000 mg | ORAL_TABLET | Freq: Every day | ORAL | 3 refills | Status: DC

## 2021-05-11 NOTE — Assessment & Plan Note (Signed)
She has had carotid stenosis up to 60-79% on the left.  Her most recent study in 11/2018 demonstrated bilateral ICA 1-39% stenosis.  As it has been more than 2 years since her last study, I have recommended we proceed with a follow-up carotid duplex.    Arrange carotid duplex   continue aspirin 81 mg daily, atorvastatin 80 mg daily.

## 2021-05-11 NOTE — Assessment & Plan Note (Signed)
Continue aspirin, statin.  

## 2021-05-11 NOTE — Assessment & Plan Note (Signed)
She has not had anginal symptoms.  Continue aspirin 81 mg daily, atorvastatin 80 mg daily.  Follow-up in 1 year.

## 2021-05-11 NOTE — Assessment & Plan Note (Signed)
LDL optimal in August 2022.  Continue atorvastatin 80 mg daily.

## 2021-05-11 NOTE — Assessment & Plan Note (Signed)
She has a systolic murmur on exam.  It has been over 3 years since her last echocardiogram.  Obtain follow-up echocardiogram.

## 2021-05-11 NOTE — Patient Instructions (Signed)
Medication Instructions:  Your physician recommends that you continue on your current medications as directed. Please refer to the Current Medication list given to you today.  *If you need a refill on your cardiac medications before your next appointment, please call your pharmacy*   Lab Work: None ordered  If you have labs (blood work) drawn today and your tests are completely normal, you will receive your results only by: [place] (if you have MyChart) OR A paper copy in the mail If you have any lab test that is abnormal or we need to change your treatment, we will call you to review the results.   Testing/Procedures: Your physician has requested that you have an echocardiogram. Echocardiography is a painless test that uses sound waves to create images of your heart. It provides your doctor with information about the size and shape of your heart and how well your hearts chambers and valves are working. This procedure takes approximately one hour. There are no restrictions for this procedure.  Your physician has requested that you have a carotid duplex. This test is an ultrasound of the carotid arteries in your neck. It looks at blood flow through these arteries that supply the brain with blood. Allow one hour for this exam. There are no restrictions or special instructions.    Follow-Up: At [facility], you and your health needs are our priority.  As part of our continuing mission to provide you with exceptional heart care, we have created designated Provider Care Teams.  These Care Teams include your primary Cardiologist (physician) and Advanced Practice Providers (APPs -  Physician Assistants and Nurse Practitioners) who all work together to provide you with the care you need, when you need it.  We recommend signing up for the patient portal called "MyChart".  Sign up information is provided on this After Visit Summary.  MyChart is used to connect with patients for Virtual  Visits (Telemedicine).  Patients are able to view lab/test results, encounter notes, upcoming appointments, etc.  Non-urgent messages can be sent to your provider as well.   To learn more about what you can do with MyChart, go to [email or web address].    Your next appointment:   12 month(s)  The format for your next appointment:   In Person  Provider:   Ena Dawley, MD  or Richardson Dopp, PA-C         Other Instructions

## 2021-05-31 ENCOUNTER — Ambulatory Visit (HOSPITAL_COMMUNITY): Payer: Medicare Other | Attending: Physician Assistant

## 2021-05-31 ENCOUNTER — Encounter: Payer: Self-pay | Admitting: Physician Assistant

## 2021-05-31 ENCOUNTER — Other Ambulatory Visit: Payer: Self-pay

## 2021-05-31 DIAGNOSIS — E782 Mixed hyperlipidemia: Secondary | ICD-10-CM | POA: Insufficient documentation

## 2021-05-31 DIAGNOSIS — I6523 Occlusion and stenosis of bilateral carotid arteries: Secondary | ICD-10-CM | POA: Diagnosis not present

## 2021-05-31 DIAGNOSIS — I700 Atherosclerosis of aorta: Secondary | ICD-10-CM | POA: Insufficient documentation

## 2021-05-31 DIAGNOSIS — I071 Rheumatic tricuspid insufficiency: Secondary | ICD-10-CM | POA: Insufficient documentation

## 2021-05-31 DIAGNOSIS — I2510 Atherosclerotic heart disease of native coronary artery without angina pectoris: Secondary | ICD-10-CM | POA: Diagnosis not present

## 2021-05-31 DIAGNOSIS — I2584 Coronary atherosclerosis due to calcified coronary lesion: Secondary | ICD-10-CM | POA: Diagnosis not present

## 2021-05-31 HISTORY — DX: Rheumatic tricuspid insufficiency: I07.1

## 2021-05-31 LAB — ECHOCARDIOGRAM COMPLETE
Area-P 1/2: 2.99 cm2
S' Lateral: 2.6 cm

## 2021-06-07 ENCOUNTER — Ambulatory Visit (HOSPITAL_COMMUNITY)
Admission: RE | Admit: 2021-06-07 | Discharge: 2021-06-07 | Disposition: A | Discharge status: Home / Self Care | Payer: Medicare Other | Source: Ambulatory Visit | Attending: Cardiovascular Disease | Admitting: Cardiovascular Disease

## 2021-06-07 ENCOUNTER — Other Ambulatory Visit: Payer: Self-pay

## 2021-06-07 DIAGNOSIS — E782 Mixed hyperlipidemia: Secondary | ICD-10-CM | POA: Diagnosis not present

## 2021-06-07 DIAGNOSIS — I2510 Atherosclerotic heart disease of native coronary artery without angina pectoris: Secondary | ICD-10-CM | POA: Insufficient documentation

## 2021-06-07 DIAGNOSIS — I6523 Occlusion and stenosis of bilateral carotid arteries: Secondary | ICD-10-CM | POA: Insufficient documentation

## 2021-06-07 DIAGNOSIS — I700 Atherosclerosis of aorta: Secondary | ICD-10-CM | POA: Insufficient documentation

## 2021-06-07 DIAGNOSIS — I2584 Coronary atherosclerosis due to calcified coronary lesion: Secondary | ICD-10-CM | POA: Insufficient documentation

## 2021-06-08 ENCOUNTER — Encounter: Payer: Self-pay | Admitting: Physician Assistant

## 2021-06-20 ENCOUNTER — Telehealth: Payer: Self-pay

## 2021-06-20 ENCOUNTER — Ambulatory Visit (INDEPENDENT_AMBULATORY_CARE_PROVIDER_SITE_OTHER): Payer: Medicare Other

## 2021-06-20 ENCOUNTER — Ambulatory Visit: Payer: Medicare Other

## 2021-06-20 DIAGNOSIS — Z0000 Encounter for general adult medical examination without abnormal findings: Secondary | ICD-10-CM

## 2021-06-20 DIAGNOSIS — J309 Allergic rhinitis, unspecified: Secondary | ICD-10-CM

## 2021-06-20 MED ORDER — LEVOCETIRIZINE DIHYDROCHLORIDE 5 MG PO TABS: ORAL_TABLET | ORAL | 11 refills | Status: DC

## 2021-06-20 NOTE — Telephone Encounter (Signed)
Patient is requesting a refill on Levocetirizine (Xyzal) to be sent to [facility].  Also the patient is complaining of staying fatigued after sleeping  over 9 hours everyday.  She stated that she noticed that its been going on for a few months.  Wants to know could it be her thyroid.  She is currently taking Synthroid (brand name only).  Any suggestions?

## 2021-06-20 NOTE — Addendum Note (Signed)
Addended by: Biagio Borg on: 06/20/2021 12:41 PM   Modules accepted: Orders

## 2021-06-20 NOTE — Telephone Encounter (Signed)
Xyzal done erx  Not sure what to say except fatigue is not likely due to thyroid as her test in august 2022 was essentially normal

## 2021-06-20 NOTE — Patient Instructions (Addendum)
Meagan Holmes , Thank you for taking time to come for your Medicare Wellness Visit. I appreciate your ongoing commitment to your health goals. Please review the following plan we discussed and let me know if I can assist you in the future.   Screening recommendations/referrals: Colonoscopy: Not a candidate for screening due to age 81: 01/18/2021; due every 1-2 years Bone Density: 01/22/2016; no longer recommended Recommended yearly ophthalmology/optometry visit for glaucoma screening and checkup Recommended yearly dental visit for hygiene and checkup  Vaccinations: Influenza vaccine: 01/2021 Pneumococcal vaccine: 06/23/2005, 12/30/2017 Tdap vaccine: 07/09/2018; due every 10 years Shingles vaccine: 11/27/2018   Covid-19: 06/12/2019, 07/07/2019, 12/28/221  Advanced directives: Please bring a copy of your health care power of attorney and living will to the office at your convenience.  Conditions/risks identified: Yes; Client understands the importance of follow-up appointments with providers by attending scheduled visits and discussed goals to eat healthier, increase physical activity 5 times a week for 30 minutes each, exercise the brain by doing stimulating brain exercises (reading, adult coloring, crafting, listening to music, puzzles, etc.), socialize and enjoy life more, get enough sleep at least 8-9 hours average per night and make time for laughter.  Next appointment: Please schedule your next Medicare Wellness Visit with your Nurse Health Advisor in 1 year by calling [phone].   Preventive Care 58 Years and Older, Female Preventive care refers to lifestyle choices and visits with your health care provider that can promote health and wellness. What does preventive care include? A yearly physical exam. This is also called an annual well check. Dental exams once or twice a year. Routine eye exams. Ask your health care provider how often you should have your eyes checked. Personal  lifestyle choices, including: Daily care of your teeth and gums. Regular physical activity. Eating a healthy diet. Avoiding tobacco and drug use. Limiting alcohol use. Practicing safe sex. Taking low-dose aspirin every day. Taking vitamin and mineral supplements as recommended by your health care provider. What happens during an annual well check? The services and screenings done by your health care provider during your annual well check will depend on your age, overall health, lifestyle risk factors, and family history of disease. Counseling  Your health care provider may ask you questions about your: Alcohol use. Tobacco use. Drug use. Emotional well-being. Home and relationship well-being. Sexual activity. Eating habits. History of falls. Memory and ability to understand (cognition). Work and work Statistician. Reproductive health. Screening  You may have the following tests or measurements: Height, weight, and BMI. Blood pressure. Lipid and cholesterol levels. These may be checked every 5 years, or more frequently if you are over 41 years old. Skin check. Lung cancer screening. You may have this screening every year starting at age 6 if you have a 30-pack-year history of smoking and currently smoke or have quit within the past 15 years. Fecal occult blood test (FOBT) of the stool. You may have this test every year starting at age 19. Flexible sigmoidoscopy or colonoscopy. You may have a sigmoidoscopy every 5 years or a colonoscopy every 10 years starting at age 25. Hepatitis C blood test. Hepatitis B blood test. Sexually transmitted disease (STD) testing. Diabetes screening. This is done by checking your blood sugar (glucose) after you have not eaten for a while (fasting). You may have this done every 1-3 years. Bone density scan. This is done to screen for osteoporosis. You may have this done starting at age 70. Mammogram. This may be done every 1-2  years. Talk to your  health care provider about how often you should have regular mammograms. Talk with your health care provider about your test results, treatment options, and if necessary, the need for more tests. Vaccines  Your health care provider may recommend certain vaccines, such as: Influenza vaccine. This is recommended every year. Tetanus, diphtheria, and acellular pertussis (Tdap, Td) vaccine. You may need a Td booster every 10 years. Zoster vaccine. You may need this after age 20. Pneumococcal 13-valent conjugate (PCV13) vaccine. One dose is recommended after age 25. Pneumococcal polysaccharide (PPSV23) vaccine. One dose is recommended after age 43. Talk to your health care provider about which screenings and vaccines you need and how often you need them. This information is not intended to replace advice given to you by your health care provider. Make sure you discuss any questions you have with your health care provider. Document Released: 05/12/2015 Document Revised: 01/03/2016 Document Reviewed: 02/14/2015 Elsevier Interactive Patient Education  2017 [employer] Prevention in the Home Falls can cause injuries. They can happen to people of all ages. There are many things you can do to make your home safe and to help prevent falls. What can I do on the outside of my home? Regularly fix the edges of walkways and driveways and fix any cracks. Remove anything that might make you trip as you walk through a door, such as a raised step or threshold. Trim any bushes or trees on the path to your home. Use bright outdoor lighting. Clear any walking paths of anything that might make someone trip, such as rocks or tools. Regularly check to see if handrails are loose or broken. Make sure that both sides of any steps have handrails. Any raised decks and porches should have guardrails on the edges. Have any leaves, snow, or ice cleared regularly. Use sand or salt on walking paths during winter. Clean  up any spills in your garage right away. This includes oil or grease spills. What can I do in the bathroom? Use night lights. Install grab bars by the toilet and in the tub and shower. Do not use towel bars as grab bars. Use non-skid mats or decals in the tub or shower. If you need to sit down in the shower, use a plastic, non-slip stool. Keep the floor dry. Clean up any water that spills on the floor as soon as it happens. Remove soap buildup in the tub or shower regularly. Attach bath mats securely with double-sided non-slip rug tape. Do not have throw rugs and other things on the floor that can make you trip. What can I do in the bedroom? Use night lights. Make sure that you have a light by your bed that is easy to reach. Do not use any sheets or blankets that are too big for your bed. They should not hang down onto the floor. Have a firm chair that has side arms. You can use this for support while you get dressed. Do not have throw rugs and other things on the floor that can make you trip. What can I do in the kitchen? Clean up any spills right away. Avoid walking on wet floors. Keep items that you use a lot in easy-to-reach places. If you need to reach something above you, use a strong step stool that has a grab bar. Keep electrical cords out of the way. Do not use floor polish or wax that makes floors slippery. If you must use wax, use non-skid floor  wax. Do not have throw rugs and other things on the floor that can make you trip. What can I do with my stairs? Do not leave any items on the stairs. Make sure that there are handrails on both sides of the stairs and use them. Fix handrails that are broken or loose. Make sure that handrails are as long as the stairways. Check any carpeting to make sure that it is firmly attached to the stairs. Fix any carpet that is loose or worn. Avoid having throw rugs at the top or bottom of the stairs. If you do have throw rugs, attach them to the  floor with carpet tape. Make sure that you have a light switch at the top of the stairs and the bottom of the stairs. If you do not have them, ask someone to add them for you. What else can I do to help prevent falls? Wear shoes that: Do not have high heels. Have rubber bottoms. Are comfortable and fit you well. Are closed at the toe. Do not wear sandals. If you use a stepladder: Make sure that it is fully opened. Do not climb a closed stepladder. Make sure that both sides of the stepladder are locked into place. Ask someone to hold it for you, if possible. Clearly mark and make sure that you can see: Any grab bars or handrails. First and last steps. Where the edge of each step is. Use tools that help you move around (mobility aids) if they are needed. These include: Canes. Walkers. Scooters. Crutches. Turn on the lights when you go into a dark area. Replace any light bulbs as soon as they burn out. Set up your furniture so you have a clear path. Avoid moving your furniture around. If any of your floors are uneven, fix them. If there are any pets around you, be aware of where they are. Review your medicines with your doctor. Some medicines can make you feel dizzy. This can increase your chance of falling. Ask your doctor what other things that you can do to help prevent falls. This information is not intended to replace advice given to you by your health care provider. Make sure you discuss any questions you have with your health care provider. Document Released: 02/09/2009 Document Revised: 09/21/2015 Document Reviewed: 05/20/2014 Elsevier Interactive Patient Education  2017 [employer].

## 2021-06-20 NOTE — Telephone Encounter (Signed)
Called patient to inform her that her medication was sent to the pharmacy. Informed patient that her fatigue was not likely linked to her thyroid as he lab was normal back in Aug 2022. Patient verbalize understanding. No further questions

## 2021-06-20 NOTE — Progress Notes (Signed)
I connected with Elton Sin today by telephone and verified that I am speaking with the correct person using two identifiers. Location patient: home Location provider: work Persons participating in the virtual visit: patient, provider.   I discussed the limitations, risks, security and privacy concerns of performing an evaluation and management service by telephone and the availability of in person appointments. I also discussed with the patient that there may be a patient responsible charge related to this service. The patient expressed understanding and verbally consented to this telephonic visit.    Interactive audio and video telecommunications were attempted between this provider and patient, however failed, due to patient having technical difficulties OR patient did not have access to video capability.  We continued and completed visit with audio only.  Some vital signs may be absent or patient reported.   Time Spent with patient on telephone encounter: 40 minutes  Subjective:   Meagan Holmes is a 81 y.o. female who presents for Medicare Annual (Subsequent) preventive examination.  Review of Systems     Cardiac Risk Factors include: advanced age (>15men, >43 women);dyslipidemia;family history of premature cardiovascular disease     Objective:    There were no vitals filed for this visit. There is no height or weight on file to calculate BMI.  Advanced Directives 06/20/2021  Does Patient Have a Medical Advance Directive? Yes  Type of Advance Directive Living will  Does patient want to make changes to medical advance directive? No - Patient declined    Current Medications (verified) Outpatient Encounter Medications as of 06/20/2021  Medication Sig   albuterol (VENTOLIN HFA) 108 (90 Base) MCG/ACT inhaler Inhale 1-2 puffs into the lungs every 6 (six) hours as needed for wheezing or shortness of breath.   aspirin 81 MG tablet Take 81 mg by mouth daily.   atorvastatin  (LIPITOR) 80 MG tablet Take 1 tablet (80 mg total) by mouth daily at 6 PM.   calcium gluconate 500 MG tablet Take 500 mg by mouth daily.   Cholecalciferol (VITAMIN D3 PO) Take 2,000 Units by mouth daily.    levocetirizine (XYZAL) 5 MG tablet TAKE 1 TABLET BY MOUTH EVERY DAY IN THE EVENING   LORazepam (ATIVAN) 0.5 MG tablet TAKE 1 TABLET BY MOUTH TWICE A DAY AS NEEDED FOR ANXIETY   SYNTHROID 88 MCG tablet take 1 tablet by mouth once  a day before breakfast   promethazine-dextromethorphan (PROMETHAZINE-DM) 6.25-15 MG/5ML syrup Take 5 mLs by mouth 4 (four) times daily as needed for cough. (Patient not taking: Reported on 06/20/2021)   No facility-administered encounter medications on file as of 06/20/2021.    Allergies (verified) Amoxil [amoxicillin]   History: Past Medical History:  Diagnosis Date   Allergic rhinitis    a lot better after septoplasty- remote   Allergic rhinitis 02/10/2007   Qualifier: Diagnosis of  By: Larose Kells MD, [place]    Anxiety and depression 02/10/2007   Carotid artery disease ([place]) 05/21/2014   Carotid US 2/23: Bilat ICA 1-39   COPD (chronic obstructive pulmonary disease) ([place]) 10/02/2010   Family history of early CAD 09/02/2017   Heart murmur 07/06/2014   Hematuria 2004   (-) CT abd-pelvis (-) cystoscopy   Hyperlipidemia 02/17/2009   Qualifier: Diagnosis of  By: Larose Kells MD, [place]    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   Osteopenia    Osteoporosis 10/02/2010    DEXA  2008 T score -2.1 DEXA 02-2009 Tscore - 3.3 Started fosamax  2009, switched to later to actonel to increase compliance    Pre-diabetes 12/17/2017   Triceps tendonitis 12/14/2015   Tricuspid regurgitation 05/31/2021   Echo 2/23: EF 60-65, no RWMA, normal RVSF, normal PASP (RVSP 20.4), trivial MR, mild TR   Past Surgical History:  Procedure Laterality Date   APPENDECTOMY     CARPAL TUNNEL RELEASE     NASAL SEPTUM SURGERY     THYROIDECTOMY     Family  History  Problem Relation Age of Onset   Heart attack Father    Diabetes Mother    Hypertension Mother    Colon cancer Other        uncle, age?   Breast cancer Neg Hx    Cancer Neg Hx    Heart disease Neg Hx    Stroke Neg Hx    Social History   Socioeconomic History   Marital status: Married    Spouse name: Not on file   Number of children: 3   Years of education: 12   Highest education level: Not on file  Occupational History   Occupation: retired  Tobacco Use   Smoking status: Former    Packs/day: 0.75    Years: 58.00    Pack years: 43.50    Types: Cigarettes    Start date: 04/29/2012    Quit date: 10/30/2020    Years since quitting: 0.6   Smokeless tobacco: Never   Tobacco comments:    1/2 ppd  Substance and Sexual Activity   Alcohol use: No    Alcohol/week: 0.0 standard drinks   Drug use: No   Sexual activity: Not on file  Other Topics Concern   Not on file  Social History Narrative   Lost son (to drugs) 2008     Husband ill, she takes care of him, raising her g-daughter    Born and raised in [place], [place]. Currently resides in a house with her husband and greatgrandaughter. Maltese dog. Fun: play games with family, gardening, gym   Denies religious beliefs that would effect health care.             Social Determinants of Health   Financial Resource Strain: Low Risk    Difficulty of Paying Living Expenses: Not hard at all  Food Insecurity: No Food Insecurity   Worried About Charity fundraiser in the Last Year: Never true   [place] in the Last Year: Never true  Transportation Needs: No Transportation Needs   Lack of Transportation (Medical): No   Lack of Transportation (Non-Medical): No  Physical Activity: Insufficiently Active   Days of Exercise per Week: 2 days   Minutes of Exercise per Session: 60 min  Stress: No Stress Concern Present   Feeling of Stress : Not at all  Social Connections: Moderately Integrated   Frequency of Communication  with Friends and Family: More than three times a week   Frequency of Social Gatherings with Friends and Family: More than three times a week   Attends Religious Services: More than 4 times per year   Active Member of [employer] or Organizations: Yes   Attends Archivist Meetings: More than 4 times per year   Marital Status: Widowed    Tobacco Counseling Counseling given: Not Answered Tobacco comments: 1/2 ppd   Clinical Intake:  Pre-visit preparation completed: Yes  Pain : No/denies pain     Nutritional Risks: None Diabetes: No  How often do you need to have someone help you  when you read instructions, pamphlets, or other written materials from your doctor or pharmacy?: 1 - Never What is the last grade level you completed in school?: GED  Diabetic? no  Interpreter Needed?: No  Information entered by :: Lisette Abu, LPN   Activities of Daily Living In your present state of health, do you have any difficulty performing the following activities: 06/20/2021  Hearing? N  Vision? N  Difficulty concentrating or making decisions? N  Walking or climbing stairs? N  Dressing or bathing? N  Doing errands, shopping? N  Preparing Food and eating ? N  Using the Toilet? N  In the past six months, have you accidently leaked urine? N  Do you have problems with loss of bowel control? N  Managing your Medications? N  Managing your Finances? N  Housekeeping or managing your Housekeeping? N  Some recent data might be hidden    Patient Care Team: Biagio Borg, MD as PCP - General (Internal Medicine) Freada Bergeron, MD as PCP - Cardiology (Cardiology) Inda Castle, MD (Inactive) as Consulting Physician (Gastroenterology) Sharmon Revere as Physician Assistant (Cardiology) Darleen Crocker, MD as Consulting Physician (Ophthalmology)  Indicate any recent Medical Services you may have received from other than [facility] providers in the past year (date may be  approximate).     Assessment:   This is a routine wellness examination for Delana.  Hearing/Vision screen Hearing Screening - Comments:: Patient denied any hearing difficulty.   No hearing aids.  Vision Screening - Comments:: Eye exam done annually by:  Darleen Crocker, MD.  Dietary issues and exercise activities discussed: Current Exercise Habits: Home exercise routine, Type of exercise: walking;treadmill;stretching;strength training/weights, Time (Minutes): 60, Frequency (Times/Week): 2, Weekly Exercise (Minutes/Week): 120, Intensity: Moderate, Exercise limited by: cardiac condition(s);respiratory conditions(s)   Goals Addressed             This Visit's Progress    Patient Stated       My goal is to stay healthy and well.  Continue to be physically active, proactive and socially active.      Depression Screen PHQ 2/9 Scores 06/20/2021 06/07/2020 06/07/2020 10/27/2018 07/09/2018 12/17/2017 08/08/2016  PHQ - 2 Score 0 0 0 1 0 0 4  PHQ- 9 Score - 0 - - 0 - 9    Fall Risk Fall Risk  06/20/2021 06/07/2020 10/27/2018 12/17/2017 08/08/2016  Falls in the past year? 0 0 0 No No  Number falls in past yr: 0 0 - - -  Injury with Fall? 0 0 - - -  Risk for fall due to : No Fall Risks - - - -  Follow up Falls evaluation completed - - - -    FALL RISK PREVENTION PERTAINING TO THE HOME:  Any stairs in or around the home? No  If so, are there any without handrails? No  Home free of loose throw rugs in walkways, pet beds, electrical cords, etc? Yes  Adequate lighting in your home to reduce risk of falls? Yes   ASSISTIVE DEVICES UTILIZED TO PREVENT FALLS:  Life alert? Yes  Use of a cane, walker or w/c? No  Grab bars in the bathroom? No  Shower chair or bench in shower? No  Elevated toilet seat or a handicapped toilet? No   TIMED UP AND GO:  Was the test performed? No .  Length of time to ambulate 10 feet: n/a sec.   Gait steady and fast without use of assistive device  Cognitive  Function:  Normal cognitive status assessed by direct observation by this Nurse Health Advisor. No abnormalities found.          Immunizations Immunization History  Administered Date(s) Administered   Influenza Split 02/13/2011, 01/21/2012, 02/01/2014   Influenza Whole 02/10/2007, 02/17/2008, 02/17/2009, 01/17/2010   Influenza, High Dose Seasonal PF 01/17/2013   Influenza,inj,quad, With Preservative 02/10/2019   Influenza-Unspecified 02/06/2015, 01/28/2016, 02/12/2017, 12/30/2017, 02/08/2020   PFIZER Comirnaty(Gray Top)Covid-19 Tri-Sucrose Vaccine 06/12/2019, 07/07/2019, 04/25/2020   Pneumococcal Conjugate-13 04/12/2014, 02/12/2017   Pneumococcal Polysaccharide-23 02/17/2008, 02/01/2014, 12/30/2017   Pneumococcal-Unspecified 12/30/2017   Td 02/10/2007, 07/30/2019   Tdap 07/09/2018, 07/09/2018   Zoster Recombinat (Shingrix) 11/27/2018    TDAP status: Up to date  Flu Vaccine status: Up to date  Pneumococcal vaccine status: Up to date  Covid-19 vaccine status: Completed vaccines  Qualifies for Shingles Vaccine? Yes   Zostavax completed No   Shingrix Completed?: No.    Education has been provided regarding the importance of this vaccine. Patient has been advised to call insurance company to determine out of pocket expense if they have not yet received this vaccine. Advised may also receive vaccine at local pharmacy or Health Dept. Verbalized acceptance and understanding.  Screening Tests Health Maintenance  Topic Date Due   Zoster Vaccines- Shingrix (2 of 2) 01/22/2019   COVID-19 Vaccine (4 - Booster for Pfizer series) 06/20/2020   INFLUENZA VACCINE  11/27/2020   TETANUS/TDAP  07/29/2029   Pneumonia Vaccine 66+ Years old  Completed   DEXA SCAN  Completed   HPV VACCINES  Aged Out    Health Maintenance  Health Maintenance Due  Topic Date Due   Zoster Vaccines- Shingrix (2 of 2) 01/22/2019   COVID-19 Vaccine (4 - Booster for Pfizer series) 06/20/2020   INFLUENZA VACCINE  11/27/2020     Colorectal cancer screening: No longer required.   Mammogram status: Completed 01/18/2021. Repeat every year  Bone Density status: Completed 01/22/2016. Results reflect: Bone density results: OSTEOPENIA. Repeat every 0 years.  Lung Cancer Screening: (Low Dose CT Chest recommended if Age 36-80 years, 30 pack-year currently smoking OR have quit w/in 15years.) does not qualify.   Lung Cancer Screening Referral: no  Additional Screening:  Hepatitis C Screening: does not qualify; Completed no  Vision Screening: Recommended annual ophthalmology exams for early detection of glaucoma and other disorders of the eye. Is the patient up to date with their annual eye exam?  Yes  Who is the provider or what is the name of the office in which the patient attends annual eye exams? Darleen Crocker, MD. If pt is not established with a provider, would they like to be referred to a provider to establish care? No .   Dental Screening: Recommended annual dental exams for proper oral hygiene  Community Resource Referral / Chronic Care Management: CRR required this visit?  No   CCM required this visit?  No      Plan:     I have personally reviewed and noted the following in the patients chart:   Medical and social history Use of alcohol, tobacco or illicit drugs  Current medications and supplements including opioid prescriptions.  Functional ability and status Nutritional status Physical activity Advanced directives List of other physicians Hospitalizations, surgeries, and ER visits in previous 12 months Vitals Screenings to include cognitive, depression, and falls Referrals and appointments  In addition, I have reviewed and discussed with patient certain preventive protocols, quality metrics, and best practice recommendations. A written personalized care plan  for preventive services as well as general preventive health recommendations were provided to patient.     Sheral Flow,  LPN   [id number]/49/[id number]   Nurse Notes:  Patient is cogitatively intact. There were no vitals filed for this visit. There is no height or weight on file to calculate BMI.

## 2021-09-01 ENCOUNTER — Other Ambulatory Visit: Payer: Self-pay | Admitting: Internal Medicine

## 2021-09-01 DIAGNOSIS — E039 Hypothyroidism, unspecified: Secondary | ICD-10-CM

## 2021-09-01 NOTE — Telephone Encounter (Signed)
Please refill as per office routine med refill policy (all routine meds to be refilled for 3 mo or monthly (per pt preference) up to one year from last visit, then month to month grace period for 3 mo, then further med refills will have to be denied) ? ?

## 2021-09-17 ENCOUNTER — Ambulatory Visit
Admission: EM | Admit: 2021-09-17 | Discharge: 2021-09-17 | Disposition: A | Discharge status: Home / Self Care | Payer: Medicare Other | Attending: Family Medicine | Admitting: Family Medicine

## 2021-09-17 DIAGNOSIS — L03114 Cellulitis of left upper limb: Secondary | ICD-10-CM

## 2021-09-17 MED ORDER — DOXYCYCLINE HYCLATE 100 MG PO CAPS: 100.0000 mg | ORAL_CAPSULE | Freq: Two times a day (BID) | ORAL | 0 refills | Status: DC

## 2021-09-17 MED ORDER — CHLORHEXIDINE GLUCONATE 4 % EX LIQD: Freq: Every day | CUTANEOUS | 0 refills | Status: DC | PRN

## 2021-09-17 NOTE — ED Provider Notes (Signed)
[facility]    CSN: [id number] Arrival date & time: 09/17/21  1330      History   Chief Complaint Chief Complaint  Patient presents with   Insect Bite    HPI Meagan Holmes is a 81 y.o. female.   Presenting today with an insect bite to the left shoulder that occurred about 2 weeks ago.  She states she has been cleaning the area with peroxide and applying antiseptic cream but the area of the past week has become more red, swollen, tender.  Denies fever, chills, nausea, vomiting, weakness, numbness, tingling.   Past Medical History:  Diagnosis Date   Allergic rhinitis    a lot better after septoplasty- remote   Allergic rhinitis 02/10/2007   Qualifier: Diagnosis of  By: Larose Kells MD, [place]    Anxiety and depression 02/10/2007   Carotid artery disease ([place]) 05/21/2014   Carotid US 2/23: Bilat ICA 1-39   COPD (chronic obstructive pulmonary disease) ([place]) 10/02/2010   Family history of early CAD 09/02/2017   Heart murmur 07/06/2014   Hematuria 2004   (-) CT abd-pelvis (-) cystoscopy   Hyperlipidemia 02/17/2009   Qualifier: Diagnosis of  By: Larose Kells MD, [place]    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   Osteopenia    Osteoporosis 10/02/2010    DEXA  2008 T score -2.1 DEXA 02-2009 Tscore - 3.3 Started fosamax 2009, switched to later to actonel to increase compliance    Pre-diabetes 12/17/2017   Triceps tendonitis 12/14/2015   Tricuspid regurgitation 05/31/2021   Echo 2/23: EF 60-65, no RWMA, normal RVSF, normal PASP (RVSP 20.4), trivial MR, mild TR    Patient Active Problem List   Diagnosis Date Noted   Tricuspid regurgitation 05/31/2021   Calcification of coronary artery 05/10/2021   Shakiness 12/10/2020   Gallstone 06/07/2020   Aortic atherosclerosis ([place]) 06/07/2020   Cough 07/21/2019   Pre-diabetes 12/17/2017   Family history of early CAD 09/02/2017   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    Medicare annual wellness visit, subsequent 08/08/2016   Osteoarthritis 02/22/2016   Triceps tendonitis 12/14/2015   Heart murmur 07/06/2014   Carotid artery disease ([place]) 05/21/2014   COPD (chronic obstructive pulmonary disease) ([place]) 10/02/2010   Osteoporosis 10/02/2010   Encounter for well adult exam with abnormal findings 10/02/2010   Hyperlipidemia 02/17/2009   Hypothyroidism 02/10/2007   Anxiety and depression 02/10/2007   Allergic rhinitis 02/10/2007   HEMATURIA, MICROSCOPIC, HX OF 02/10/2007    Past Surgical History:  Procedure Laterality Date   APPENDECTOMY     CARPAL TUNNEL RELEASE     NASAL SEPTUM SURGERY     THYROIDECTOMY      OB History   No obstetric history on file.      Home Medications    Prior to Admission medications   Medication Sig Start Date End Date Taking? Authorizing Provider  chlorhexidine (HIBICLENS) 4 % external liquid Apply topically daily as needed. 09/17/21  Yes Volney American, PA-C  doxycycline (VIBRAMYCIN) 100 MG capsule Take 1 capsule (100 mg total) by mouth 2 (two) times daily. 09/17/21  Yes Volney American, PA-C  albuterol (VENTOLIN HFA) 108 (90 Base) MCG/ACT inhaler Inhale 1-2 puffs into the lungs every 6 (six) hours as needed for wheezing or shortness of breath. 11/01/20   Volney American, PA-C  aspirin 81 MG tablet Take 81 mg by mouth daily.    [provider]  atorvastatin (LIPITOR) 80  MG tablet Take 1 tablet (80 mg total) by mouth daily at 6 PM. 05/11/21   Richardson Dopp T, PA-C  calcium gluconate 500 MG tablet Take 500 mg by mouth daily.    [provider]  Cholecalciferol (VITAMIN D3 PO) Take 2,000 Units by mouth daily.     [provider]  levocetirizine (XYZAL) 5 MG tablet TAKE 1 TABLET BY MOUTH EVERY DAY IN THE EVENING 06/20/21   Biagio Borg, MD  LORazepam (ATIVAN) 0.5 MG tablet TAKE 1 TABLET BY MOUTH TWICE A DAY AS NEEDED FOR ANXIETY 04/04/21   Biagio Borg, MD   promethazine-dextromethorphan (PROMETHAZINE-DM) 6.25-15 MG/5ML syrup Take 5 mLs by mouth 4 (four) times daily as needed for cough. Patient not taking: Reported on 06/20/2021 02/17/21   Teodora Medici, FNP  SYNTHROID 88 MCG tablet take 1 tablet by mouth once  a day before breakfast 03/13/21   Biagio Borg, MD    Family History Family History  Problem Relation Age of Onset   Heart attack Father    Diabetes Mother    Hypertension Mother    Colon cancer Other        uncle, age?   Breast cancer Neg Hx    Cancer Neg Hx    Heart disease Neg Hx    Stroke Neg Hx     Social History Social History   Tobacco Use   Smoking status: Former    Packs/day: 0.75    Years: 58.00    Pack years: 43.50    Types: Cigarettes    Start date: 04/29/2012    Quit date: 10/30/2020    Years since quitting: 0.8   Smokeless tobacco: Never   Tobacco comments:    1/2 ppd  Substance Use Topics   Alcohol use: No    Alcohol/week: 0.0 standard drinks   Drug use: No     Allergies   Amoxil [amoxicillin]   Review of Systems Review of Systems Per HPI  Physical Exam Triage Vital Signs ED Triage Vitals  Enc Vitals Group     BP 09/17/21 1420 133/77     Pulse Rate 09/17/21 1420 65     Resp 09/17/21 1420 20     Temp 09/17/21 1420 97.6 F (36.4 C)     Temp Source 09/17/21 1420 Oral     SpO2 --      Weight --      Height --      Head Circumference --      Peak Flow --      Pain Score 09/17/21 1418 0     Pain Loc --      Pain Edu? --      Excl. in [place]? --    No data found.  Updated Vital Signs BP 133/77 (BP Location: Left Arm)   Pulse 65   Temp 97.6 F (36.4 C) (Oral)   Resp 20   Visual Acuity Right Eye Distance:   Left Eye Distance:   Bilateral Distance:    Right Eye Near:   Left Eye Near:    Bilateral Near:     Physical Exam Vitals and nursing note reviewed.  Constitutional:      Appearance: Normal appearance. She is not ill-appearing.  HENT:     Head: Atraumatic.  Eyes:      Extraocular Movements: Extraocular movements intact.     Conjunctiva/sclera: Conjunctivae normal.  Cardiovascular:     Rate and Rhythm: Normal rate and regular rhythm.  Heart sounds: Normal heart sounds.  Pulmonary:     Effort: Pulmonary effort is normal.     Breath sounds: Normal breath sounds.  Musculoskeletal:        General: Normal range of motion.     Cervical back: Normal range of motion and neck supple.  Skin:    General: Skin is warm.     Comments: Erythematous edematous area with central scabbing present on left shoulder, no active bleeding or drainage.  No fluctuance or induration  Neurological:     Mental Status: She is alert and oriented to person, place, and time.     Motor: No weakness.     Gait: Gait normal.     Comments: Left upper extremity neurovascularly intact  Psychiatric:        Mood and Affect: Mood normal.        Thought Content: Thought content normal.        Judgment: Judgment normal.     UC Treatments / Results  Labs (all labs ordered are listed, but only abnormal results are displayed) Labs Reviewed - No data to display  EKG   Radiology No results found.  Procedures Procedures (including critical care time)  Medications Ordered in UC Medications - No data to display  Initial Impression / Assessment and Plan / UC Course  I have reviewed the triage vital signs and the nursing notes.  Pertinent labs & imaging results that were available during my care of the patient were reviewed by me and considered in my medical decision making (see chart for details).     We will treat with doxycycline, Hibiclens, Neosporin and warm compresses.  Return for acutely worsening symptoms.  Final Clinical Impressions(s) / UC Diagnoses   Final diagnoses:  Cellulitis of left upper extremity   Discharge Instructions   None    ED Prescriptions     Medication Sig Dispense Auth. Provider   doxycycline (VIBRAMYCIN) 100 MG capsule Take 1 capsule (100 mg  total) by mouth 2 (two) times daily. 14 capsule Volney American, [place]   chlorhexidine (HIBICLENS) 4 % external liquid Apply topically daily as needed. 120 mL Volney American, [place]      PDMP not reviewed this encounter.   Volney American, [place] 09/17/21 1523

## 2021-09-17 NOTE — ED Triage Notes (Signed)
Patient presents to Urgent Care with complaints of insect bite [place] since last week. Patient reports burning and applying antiseptic cream .

## 2021-09-22 ENCOUNTER — Other Ambulatory Visit: Payer: Self-pay | Admitting: Internal Medicine

## 2021-09-22 DIAGNOSIS — E039 Hypothyroidism, unspecified: Secondary | ICD-10-CM

## 2021-09-22 NOTE — Telephone Encounter (Signed)
Please refill as per office routine med refill policy (all routine meds to be refilled for 3 mo or monthly (per pt preference) up to one year from last visit, then month to month grace period for 3 mo, then further med refills will have to be denied) ? ?

## 2021-09-24 ENCOUNTER — Other Ambulatory Visit: Payer: Self-pay | Admitting: Family Medicine

## 2021-09-25 MED ORDER — LEVOTHYROXINE SODIUM 88 MCG PO TABS: 88.0000 ug | ORAL_TABLET | Freq: Every day | ORAL | 0 refills | Status: DC

## 2021-09-25 NOTE — Addendum Note (Signed)
Addended by: Biagio Borg on: 09/25/2021 01:07 PM   Modules accepted: Orders

## 2021-09-26 NOTE — Telephone Encounter (Signed)
Will refuse this request, prescriber not at this practice.  Requested Prescriptions  Pending Prescriptions Disp Refills  . doxycycline (VIBRAMYCIN) 100 MG capsule [Pharmacy Med Name: DOXYCYCLINE HYCLATE 100 MG CAP] 14 capsule 0    Sig: TAKE 1 CAPSULE BY MOUTH TWICE A DAY     There is no refill protocol information for this order

## 2021-11-21 DIAGNOSIS — L821 Other seborrheic keratosis: Secondary | ICD-10-CM | POA: Diagnosis not present

## 2021-11-21 DIAGNOSIS — L812 Freckles: Secondary | ICD-10-CM | POA: Diagnosis not present

## 2021-11-21 DIAGNOSIS — L820 Inflamed seborrheic keratosis: Secondary | ICD-10-CM | POA: Diagnosis not present

## 2021-11-21 DIAGNOSIS — D1801 Hemangioma of skin and subcutaneous tissue: Secondary | ICD-10-CM | POA: Diagnosis not present

## 2021-11-21 DIAGNOSIS — L720 Epidermal cyst: Secondary | ICD-10-CM | POA: Diagnosis not present

## 2021-11-21 DIAGNOSIS — D225 Melanocytic nevi of trunk: Secondary | ICD-10-CM | POA: Diagnosis not present

## 2021-11-23 DIAGNOSIS — H18413 Arcus senilis, bilateral: Secondary | ICD-10-CM | POA: Diagnosis not present

## 2021-11-23 DIAGNOSIS — H40013 Open angle with borderline findings, low risk, bilateral: Secondary | ICD-10-CM | POA: Diagnosis not present

## 2021-11-23 DIAGNOSIS — H4921 Sixth [abducent] nerve palsy, right eye: Secondary | ICD-10-CM | POA: Diagnosis not present

## 2021-11-23 DIAGNOSIS — Z961 Presence of intraocular lens: Secondary | ICD-10-CM | POA: Diagnosis not present

## 2021-12-05 ENCOUNTER — Ambulatory Visit (INDEPENDENT_AMBULATORY_CARE_PROVIDER_SITE_OTHER): Payer: Medicare Other | Admitting: Internal Medicine

## 2021-12-05 ENCOUNTER — Other Ambulatory Visit: Payer: Self-pay | Admitting: Internal Medicine

## 2021-12-05 ENCOUNTER — Encounter: Payer: Self-pay | Admitting: Internal Medicine

## 2021-12-05 VITALS — BP 110/64 | HR 71 | Temp 98.0°F | Ht 63.0 in | Wt 156.0 lb

## 2021-12-05 DIAGNOSIS — E039 Hypothyroidism, unspecified: Secondary | ICD-10-CM

## 2021-12-05 DIAGNOSIS — R7303 Prediabetes: Secondary | ICD-10-CM | POA: Diagnosis not present

## 2021-12-05 DIAGNOSIS — F419 Anxiety disorder, unspecified: Secondary | ICD-10-CM | POA: Diagnosis not present

## 2021-12-05 DIAGNOSIS — Z0001 Encounter for general adult medical examination with abnormal findings: Secondary | ICD-10-CM

## 2021-12-05 DIAGNOSIS — E782 Mixed hyperlipidemia: Secondary | ICD-10-CM | POA: Diagnosis not present

## 2021-12-05 DIAGNOSIS — F32A Depression, unspecified: Secondary | ICD-10-CM

## 2021-12-05 DIAGNOSIS — H4921 Sixth [abducent] nerve palsy, right eye: Secondary | ICD-10-CM | POA: Diagnosis not present

## 2021-12-05 DIAGNOSIS — E559 Vitamin D deficiency, unspecified: Secondary | ICD-10-CM

## 2021-12-05 DIAGNOSIS — E538 Deficiency of other specified B group vitamins: Secondary | ICD-10-CM

## 2021-12-05 LAB — CBC WITH DIFFERENTIAL/PLATELET
Basophils Absolute: 0 10*3/uL (ref 0.0–0.1)
Basophils Relative: 0.6 % (ref 0.0–3.0)
Eosinophils Absolute: 0.2 10*3/uL (ref 0.0–0.7)
Eosinophils Relative: 2.6 % (ref 0.0–5.0)
HCT: 41.9 % (ref 36.0–46.0)
Hemoglobin: 14 g/dL (ref 12.0–15.0)
Lymphocytes Relative: 29.4 % (ref 12.0–46.0)
Lymphs Abs: 2 10*3/uL (ref 0.7–4.0)
MCHC: 33.6 g/dL (ref 30.0–36.0)
MCV: 91.2 fl (ref 78.0–100.0)
Monocytes Absolute: 0.6 10*3/uL (ref 0.1–1.0)
Monocytes Relative: 8.5 % (ref 3.0–12.0)
Neutro Abs: 3.9 10*3/uL (ref 1.4–7.7)
Neutrophils Relative %: 58.9 % (ref 43.0–77.0)
Platelets: 210 10*3/uL (ref 150.0–400.0)
RBC: 4.59 Mil/uL (ref 3.87–5.11)
RDW: 13.8 % (ref 11.5–15.5)
WBC: 6.7 10*3/uL (ref 4.0–10.5)

## 2021-12-05 LAB — LIPID PANEL
Cholesterol: 115 mg/dL (ref 0–200)
HDL: 34.1 mg/dL — ABNORMAL LOW (ref >39.00)
LDL Cholesterol: 54 mg/dL (ref 0–99)
NonHDL: 81.28
Total CHOL/HDL Ratio: 3
Triglycerides: 134 mg/dL (ref 0.0–149.0)
VLDL: 26.8 mg/dL (ref 0.0–40.0)

## 2021-12-05 LAB — BASIC METABOLIC PANEL
BUN: 11 mg/dL (ref 6–23)
CO2: 32 mEq/L (ref 19–32)
Calcium: 9.8 mg/dL (ref 8.4–10.5)
Chloride: 104 mEq/L (ref 96–112)
Creatinine, Ser: 0.73 mg/dL (ref 0.40–1.20)
GFR: 77.11 mL/min (ref >60.00)
Glucose, Bld: 104 mg/dL — ABNORMAL HIGH (ref 70–99)
Potassium: 4.7 mEq/L (ref 3.5–5.1)
Sodium: 140 mEq/L (ref 135–145)

## 2021-12-05 LAB — URINALYSIS, ROUTINE W REFLEX MICROSCOPIC
Bilirubin Urine: NEGATIVE
Hgb urine dipstick: NEGATIVE
Ketones, ur: NEGATIVE
Leukocytes,Ua: NEGATIVE
Nitrite: NEGATIVE
RBC / HPF: NONE SEEN (ref 0–?)
Specific Gravity, Urine: <=1.005 — AB (ref 1.000–1.030)
Total Protein, Urine: NEGATIVE
Urine Glucose: NEGATIVE
Urobilinogen, UA: 0.2 (ref 0.0–1.0)
WBC, UA: NONE SEEN (ref 0–?)
pH: 7 (ref 5.0–8.0)

## 2021-12-05 LAB — HEPATIC FUNCTION PANEL
ALT: 35 U/L (ref 0–35)
AST: 32 U/L (ref 0–37)
Albumin: 4.4 g/dL (ref 3.5–5.2)
Alkaline Phosphatase: 40 U/L (ref 39–117)
Bilirubin, Direct: 0.1 mg/dL (ref 0.0–0.3)
Total Bilirubin: 0.6 mg/dL (ref 0.2–1.2)
Total Protein: 7.8 g/dL (ref 6.0–8.3)

## 2021-12-05 LAB — MICROALBUMIN / CREATININE URINE RATIO
Creatinine,U: 11.9 mg/dL
Microalb Creat Ratio: 5.9 mg/g (ref 0.0–30.0)
Microalb, Ur: <0.7 mg/dL (ref 0.0–1.9)

## 2021-12-05 LAB — T4, FREE: Free T4: 1.51 ng/dL (ref 0.60–1.60)

## 2021-12-05 LAB — VITAMIN B12: Vitamin B-12: 834 pg/mL (ref 211–911)

## 2021-12-05 LAB — TSH: TSH: 0.05 u[IU]/mL — ABNORMAL LOW (ref 0.35–5.50)

## 2021-12-05 LAB — VITAMIN D 25 HYDROXY (VIT D DEFICIENCY, FRACTURES): VITD: 67.35 ng/mL (ref 30.00–100.00)

## 2021-12-05 LAB — HEMOGLOBIN A1C: Hgb A1c MFr Bld: 6.3 % (ref 4.6–6.5)

## 2021-12-05 MED ORDER — CITALOPRAM HYDROBROMIDE 10 MG PO TABS: 10.0000 mg | ORAL_TABLET | Freq: Every day | ORAL | 3 refills | Status: DC

## 2021-12-05 MED ORDER — LEVOTHYROXINE SODIUM 75 MCG PO TABS: 75.0000 ug | ORAL_TABLET | Freq: Every day | ORAL | 3 refills | Status: DC

## 2021-12-05 NOTE — Progress Notes (Signed)
Patient ID: Meagan Holmes, female   DOB: 1940/06/14, 81 y.o.   MRN: [id number]         Chief Complaint:: wellness exam and anxiety, depression, low thyroid, hld, recent 6th nerve palsy per optho       HPI:  Meagan Holmes is a 81 y.o. female here for wellness exam; declines shingrix, o/w up to date.                        Also  /o worwsening anxiety depression but no SI or HI or panic.  Pt denies chest pain, increased sob or doe, wheezing, orthopnea, PND, increased LE swelling, palpitations, dizziness or syncope.   Pt denies polydipsia, polyuria, or new focal neuro s/s.   Denies hyper or hypo thyroid symptoms such as voice, skin or hair change.  Trying to follow lower chol diet.  Also dx with recent onset right nerve palsy per optho, MRI brain ordered, pt to f/u at 1 mo, with expected resolution at 3 mo.     Wt Readings from Last 3 Encounters:  12/05/21 156 lb (70.8 kg)  05/11/21 159 lb 0.6 oz (72.1 kg)  01/08/21 156 lb (70.8 kg)   BP Readings from Last 3 Encounters:  12/05/21 110/64  09/17/21 133/77  05/11/21 123/70   Immunization History  Administered Date(s) Administered   Influenza Split 02/13/2011, 01/21/2012, 02/01/2014   Influenza Whole 02/10/2007, 02/17/2008, 02/17/2009, 01/17/2010   Influenza, High Dose Seasonal PF 01/17/2013   Influenza,inj,quad, With Preservative 02/10/2019   Influenza-Unspecified 02/06/2015, 01/28/2016, 02/12/2017, 12/30/2017, 02/08/2020   PFIZER Comirnaty(Gray Top)Covid-19 Tri-Sucrose Vaccine 06/12/2019, 07/07/2019, 04/25/2020   Pneumococcal Conjugate-13 04/12/2014, 02/12/2017   Pneumococcal Polysaccharide-23 02/17/2008, 02/01/2014, 12/30/2017   Pneumococcal-Unspecified 12/30/2017   Td 02/10/2007, 07/30/2019   Tdap 07/09/2018, 07/09/2018   Zoster Recombinat (Shingrix) 11/27/2018   There are no preventive care reminders to display for this patient.     Past Medical History:  Diagnosis Date   Allergic rhinitis    a lot better after septoplasty-  remote   Allergic rhinitis 02/10/2007   Qualifier: Diagnosis of  By: Larose Kells MD, [place]    Anxiety and depression 02/10/2007   Carotid artery disease ([place]) 05/21/2014   Carotid US 2/23: Bilat ICA 1-39   COPD (chronic obstructive pulmonary disease) ([place]) 10/02/2010   Family history of early CAD 09/02/2017   Heart murmur 07/06/2014   Hematuria 2004   (-) CT abd-pelvis (-) cystoscopy   Hyperlipidemia 02/17/2009   Qualifier: Diagnosis of  By: Larose Kells MD, [place]    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 Her smoking use included cigarettes. She started smoking about 9 years ago. She has a 43.50 pack-year smoking history. She has never used smokeless tobacco. She reports that she does not drink alcohol and does not use drugs.  family history includes Colon cancer in an other family member; Diabetes in her mother; Heart attack in her father; Hypertension in her mother. Allergies  Allergen Reactions   Amoxil [Amoxicillin] Hives   Current Outpatient Medications on File Prior to Visit  Medication Sig Dispense Refill   atorvastatin (LIPITOR) 80 MG tablet Take 1 tablet (80 mg total) by mouth daily at 6 PM. 90 tablet 3   calcium gluconate 500 MG tablet Take 500 mg by mouth daily.     Cholecalciferol (VITAMIN D3 PO) Take 2,000 Units by mouth daily.      Cyanocobalamin (B-12) 500 MCG TABS      levocetirizine (XYZAL) 5 MG  tablet TAKE 1 TABLET BY MOUTH EVERY DAY IN THE EVENING 30 tablet 11   LORazepam (ATIVAN) 0.5 MG tablet TAKE 1 TABLET BY MOUTH TWICE A DAY AS NEEDED FOR ANXIETY 60 tablet 1   No current facility-administered medications on file prior to visit.        ROS:  All others reviewed and negative.  Objective        PE:  BP 110/64 (BP Location: Right Arm, Patient Position: Sitting, Cuff Size: Large)   Pulse 71   Temp 98 F (36.7 C) (Oral)   Ht '5\' 3"'$  (1.6 m)   Wt 156 lb (70.8 kg)   SpO2 97%   BMI 27.63 kg/m                 Constitutional: Pt appears in NAD               HENT: Head: NCAT.                Right Ear: External ear normal.                 Left Ear: External ear normal.                Eyes: . Pupils are equal, round, and reactive to light. Conjunctivae and EOM are normal               Nose: without d/c or deformity               Neck: Neck supple. Gross normal ROM               Cardiovascular: Normal rate and regular rhythm.                 Pulmonary/Chest: Effort normal and breath sounds without rales or wheezing.                Abd:  Soft, NT, ND, + BS, no organomegaly               Neurological: Pt is alert. At baseline orientation, motor grossly intact               Skin: Skin is warm. No rashes, no other new lesions, LE edema - none               Psychiatric: Pt behavior is normal without agitation but with depressed affect  Micro: none  Cardiac tracings I have personally interpreted today:  none  Pertinent Radiological findings (summarize): none   Lab Results  Component Value Date   WBC 6.7 12/05/2021   HGB 14.0 12/05/2021   HCT 41.9 12/05/2021   PLT 210.0 12/05/2021   GLUCOSE 104 (H) 12/05/2021   CHOL 115 12/05/2021   TRIG 134.0 12/05/2021   HDL 34.10 (L) 12/05/2021  LDLDIRECT 171.2 03/19/2012   LDLCALC 54 12/05/2021   ALT 35 12/05/2021   AST 32 12/05/2021   NA 140 12/05/2021   K 4.7 12/05/2021   CL 104 12/05/2021   CREATININE 0.73 12/05/2021   BUN 11  12/05/2021   CO2 32 12/05/2021   TSH 0.05 (L) 12/05/2021   HGBA1C 6.3 12/05/2021   MICROALBUR <0.7 12/05/2021   Assessment/Plan:  Meagan Holmes is a 81 y.o. White or Caucasian [1] female with  has a past medical history of Allergic rhinitis, Allergic rhinitis (02/10/2007), Anxiety and depression (02/10/2007), Carotid artery disease ([place]) (05/21/2014), COPD (chronic obstructive pulmonary disease) ([place]) (10/02/2010), Family history of early CAD (09/02/2017), Heart murmur (07/06/2014), Hematuria (2004), Hyperlipidemia (02/17/2009), Hypothyroidism, Nicotine dependence with nicotine-induced disorder (06/19/2017), Osteopenia, Osteoporosis (10/02/2010), Pre-diabetes (12/17/2017), Triceps tendonitis (12/14/2015), and Tricuspid regurgitation (05/31/2021).  Encounter for well adult exam with abnormal findings Age and sex appropriate education and counseling updated with regular exercise and diet Referrals for preventative services - none needed Immunizations addressed - pt for shingirx at local phamacy Smoking counseling  - none needed Evidence for depression or other mood disorder - none significant Most recent labs reviewed. I have personally reviewed and have noted: 1) the patient's medical and social history 2) The patient's current medications and supplements 3) The patient's height, weight, and BMI have been recorded in the chart   Hypothyroidism Lab Results  Component Value Date   TSH 0.05 (L) 12/05/2021  overcontroleld, pt to reduce levothyroxine to 75 mcg qd, repeat TFTs in 4 wks  Hyperlipidemia Lab Results  Component Value Date   LDLCALC 54 12/05/2021   Stable, pt to continue current statin lipitor 80 mg   Anxiety and depression New onset worsneing uncontrolled, for start celexa 10 mg qd,  to f/u any worsening symptoms or concerns, declines counseling referral or psychiatry referral  Pre-diabetes Lab Results  Component Value Date   HGBA1C 6.3 12/05/2021   Stable, pt to  continue current medical treatment  - deit, wt control, excercise   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  Cathlean Cower, MD 12/07/2021 8:38 PM [facility]

## 2021-12-05 NOTE — Patient Instructions (Signed)
Please have your Shingrix (shingles) shots done at your local pharmacy.  Please take all new medication as prescribed - the celexa 10 mg per day  Please continue all other medications as before, and refills have been done if requested.  Please have the pharmacy call with any other refills you may need.  Please continue your efforts at being more active, low cholesterol diet, and weight control.  You are otherwise up to date with prevention measures today.  Please keep your appointments with your specialists as you may have planned  Please go to the LAB at the blood drawing area for the tests to be done  You will be contacted by phone if any changes need to be made immediately.  Otherwise, you will receive a letter about your results with an explanation, but please check with MyChart first.  Please remember to sign up for MyChart if you have not done so, as this will be important to you in the future with finding out test results, communicating by private email, and scheduling acute appointments online when needed.  Please make an Appointment to return in 6 months, or sooner if needed

## 2021-12-07 ENCOUNTER — Encounter: Payer: Self-pay | Admitting: Internal Medicine

## 2021-12-07 DIAGNOSIS — H4921 Sixth [abducent] nerve palsy, right eye: Secondary | ICD-10-CM | POA: Insufficient documentation

## 2021-12-07 NOTE — Assessment & Plan Note (Signed)
New onset, MRI brain ordered per optho, f/u at one month

## 2021-12-07 NOTE — Assessment & Plan Note (Signed)
New onset worsneing uncontrolled, for start celexa 10 mg qd,  to f/u any worsening symptoms or concerns, declines counseling referral or psychiatry referral

## 2021-12-07 NOTE — Assessment & Plan Note (Signed)
Lab Results  Component Value Date   LDLCALC 54 12/05/2021   Stable, pt to continue current statin lipitor 80 mg

## 2021-12-07 NOTE — Assessment & Plan Note (Addendum)
Lab Results  Component Value Date   TSH 0.05 (L) 12/05/2021  overcontroleld, pt to reduce levothyroxine to 75 mcg qd, repeat TFTs in 4 wks

## 2021-12-07 NOTE — Assessment & Plan Note (Signed)
Age and sex appropriate education and counseling updated with regular exercise and diet Referrals for preventative services - none needed Immunizations addressed - pt for shingirx at local phamacy Smoking counseling  - none needed Evidence for depression or other mood disorder - none significant Most recent labs reviewed. I have personally reviewed and have noted: 1) the patient's medical and social history 2) The patient's current medications and supplements 3) The patient's height, weight, and BMI have been recorded in the chart

## 2021-12-07 NOTE — Assessment & Plan Note (Signed)
Lab Results  Component Value Date   HGBA1C 6.3 12/05/2021   Stable, pt to continue current medical treatment  - deit, wt control, excercise

## 2022-01-04 ENCOUNTER — Telehealth: Payer: Self-pay | Admitting: Internal Medicine

## 2022-01-04 DIAGNOSIS — E039 Hypothyroidism, unspecified: Secondary | ICD-10-CM

## 2022-01-04 NOTE — Telephone Encounter (Signed)
Patient complains of feeling shaky since her thyroid medication was changed.  Please advise.

## 2022-01-07 NOTE — Telephone Encounter (Signed)
Does the patient require a visit before changing meds, please advise.

## 2022-01-07 NOTE — Telephone Encounter (Signed)
Unable to leave message at this time regarding labs.

## 2022-01-07 NOTE — Telephone Encounter (Signed)
Ok to recheck TFTs at her convenience - I have ordered

## 2022-01-22 DIAGNOSIS — H16223 Keratoconjunctivitis sicca, not specified as Sjogren's, bilateral: Secondary | ICD-10-CM | POA: Diagnosis not present

## 2022-01-22 DIAGNOSIS — H4921 Sixth [abducent] nerve palsy, right eye: Secondary | ICD-10-CM | POA: Diagnosis not present

## 2022-02-05 DIAGNOSIS — Z1231 Encounter for screening mammogram for malignant neoplasm of breast: Secondary | ICD-10-CM | POA: Diagnosis not present

## 2022-02-05 LAB — HM MAMMOGRAPHY

## 2022-02-11 ENCOUNTER — Encounter: Payer: Self-pay | Admitting: Internal Medicine

## 2022-02-11 ENCOUNTER — Ambulatory Visit (INDEPENDENT_AMBULATORY_CARE_PROVIDER_SITE_OTHER): Payer: Medicare Other | Admitting: Internal Medicine

## 2022-02-11 VITALS — BP 120/78 | HR 54 | Temp 98.2°F | Ht 63.0 in | Wt 152.5 lb

## 2022-02-11 DIAGNOSIS — Z8639 Personal history of other endocrine, nutritional and metabolic disease: Secondary | ICD-10-CM | POA: Diagnosis not present

## 2022-02-11 DIAGNOSIS — E039 Hypothyroidism, unspecified: Secondary | ICD-10-CM

## 2022-02-11 DIAGNOSIS — F419 Anxiety disorder, unspecified: Secondary | ICD-10-CM | POA: Diagnosis not present

## 2022-02-11 DIAGNOSIS — R7303 Prediabetes: Secondary | ICD-10-CM | POA: Diagnosis not present

## 2022-02-11 DIAGNOSIS — F32A Depression, unspecified: Secondary | ICD-10-CM

## 2022-02-11 LAB — T4, FREE: Free T4: 1.19 ng/dL (ref 0.60–1.60)

## 2022-02-11 LAB — TSH: TSH: 0.87 u[IU]/mL (ref 0.35–5.50)

## 2022-02-11 MED ORDER — BUPROPION HCL ER (XL) 150 MG PO TB24: 150.0000 mg | ORAL_TABLET | Freq: Every day | ORAL | 3 refills | Status: DC

## 2022-02-11 NOTE — Assessment & Plan Note (Signed)
Lab Results  Component Value Date   HGBA1C 6.3 12/05/2021   Stable, pt to continue current medical treatment  - for diet, wt control

## 2022-02-11 NOTE — Assessment & Plan Note (Signed)
For f/u TFts as above

## 2022-02-11 NOTE — Patient Instructions (Addendum)
Ok to stop the Celexa  Please take all new medication as prescribed - the wellbutrin XL 150 mg per day  Ok to take the Immodium as needed  Please continue all other medications as before, and refills have been done if requested.  Please have the pharmacy call with any other refills you may need.  Please continue your efforts at being more active, low cholesterol diet, and weight control.  Please keep your appointments with your specialists as you may have planned  You will be contacted regarding the referral for: Endocrinology  Please go to the LAB at the blood drawing area for the tests to be done  You will be contacted by phone if any changes need to be made immediately.  Otherwise, you will receive a letter about your results with an explanation, but please check with MyChart first.  Please remember to sign up for MyChart if you have not done so, as this will be important to you in the future with finding out test results, communicating by private email, and scheduling acute appointments online when needed.  Please make an Appointment to return Jun 13 2022, or sooner if needed

## 2022-02-11 NOTE — Progress Notes (Signed)
Patient ID: Meagan Holmes, female   DOB: [birth year], 81 y.o.   MRN: [id number]        Chief Complaint: follow up history of graves dz, depression, hypersomnolence       HPI:  Meagan Holmes is a 81 y.o. female here with c/o persistent low mood and depressed, she states mostly due to ongoing stress related to being forced to raise her deceased son's now teenage daughter, declines counseling, but did try the celexa 10 mg - no side effects, but unfortunately cannot see any results at all.  Remains with worsening depressive symptoms, but no suicidal ideation, or panic; has ongoing anxiety but uses benzo very occasionally. Has persistent daytime somnolence increased recently, declines pulm referral for now, no hx of nighttime disordered breathing   Does have hx of Grave dz , recent thyroid med reduced with lowTSH, pt asks for repeat lab and Endo referral.  Pt denies chest pain, increased sob or doe, wheezing, orthopnea, PND, increased LE swelling, palpitations, dizziness or syncope.   Pt denies polydipsia, polyuria, or new focal neuro s/s.          Wt Readings from Last 3 Encounters:  02/11/22 152 lb 8 oz (69.2 kg)  12/05/21 156 lb (70.8 kg)  05/11/21 159 lb 0.6 oz (72.1 kg)   BP Readings from Last 3 Encounters:  02/11/22 120/78  12/05/21 110/64  09/17/21 133/77         Past Medical History:  Diagnosis Date   Allergic rhinitis    a lot better after septoplasty- remote   Allergic rhinitis 02/10/2007   Qualifier: Diagnosis of  By: Larose Kells MD, [place]    Anxiety and depression 02/10/2007   Carotid artery disease ([place]) 05/21/2014   Carotid US 2/23: Bilat ICA 1-39   COPD (chronic obstructive pulmonary disease) ([place]) 10/02/2010   Family history of early CAD 09/02/2017   Heart murmur 07/06/2014   Hematuria 2004   (-) CT abd-pelvis (-) cystoscopy   Hyperlipidemia 02/17/2009   Qualifier: Diagnosis of  By: Larose Kells MD, [place]    Hypothyroidism    Nicotine dependence with nicotine-induced disorder  06/19/2017   Osteopenia    Osteoporosis 10/02/2010    DEXA  2008 T score -2.1 DEXA 02-2009 Tscore - 3.3 Started fosamax 2009, switched to later to actonel to increase compliance    Pre-diabetes 12/17/2017   Triceps tendonitis 12/14/2015   Tricuspid regurgitation 05/31/2021   Echo 2/23: EF 60-65, no RWMA, normal RVSF, normal PASP (RVSP 20.4), trivial MR, mild TR   Past Surgical History:  Procedure Laterality Date   APPENDECTOMY     CARPAL TUNNEL RELEASE     NASAL SEPTUM SURGERY     THYROIDECTOMY      reports that she quit smoking about 15 months ago. Her smoking use included cigarettes. She started smoking about 9 years ago. She has a 43.50 pack-year smoking history. She has never used smokeless tobacco. She reports that she does not drink alcohol and does not use drugs. family history includes Colon cancer in an other family member; Diabetes in her mother; Heart attack in her father; Hypertension in her mother. Allergies  Allergen Reactions   Amoxil [Amoxicillin] Hives   Current Outpatient Medications on File Prior to Visit  Medication Sig Dispense Refill   atorvastatin (LIPITOR) 80 MG tablet Take 1 tablet (80 mg total) by mouth daily at 6 PM. 90 tablet 3   calcium gluconate 500 MG tablet Take 500 mg by mouth daily.  Cholecalciferol (VITAMIN D3 PO) Take 2,000 Units by mouth daily.      Cyanocobalamin (B-12) 500 MCG TABS      levocetirizine (XYZAL) 5 MG tablet TAKE 1 TABLET BY MOUTH EVERY DAY IN THE EVENING 30 tablet 11   levothyroxine (SYNTHROID) 75 MCG tablet Take 1 tablet (75 mcg total) by mouth daily. 90 tablet 3   LORazepam (ATIVAN) 0.5 MG tablet TAKE 1 TABLET BY MOUTH TWICE A DAY AS NEEDED FOR ANXIETY (Patient not taking: Reported on 02/11/2022) 60 tablet 1   No current facility-administered medications on file prior to visit.        ROS:  All others reviewed and negative.  Objective        PE:  BP 120/78 (BP Location: Left Arm, Patient Position: Sitting, Cuff Size:  Normal)   Pulse (!) 54   Temp 98.2 F (36.8 C) (Oral)   Ht '5\' 3"'$  (1.6 m)   Wt 152 lb 8 oz (69.2 kg)   SpO2 91%   BMI 27.01 kg/m                 Constitutional: Pt appears in NAD               HENT: Head: NCAT.                Right Ear: External ear normal.                 Left Ear: External ear normal.                Eyes: . Pupils are equal, round, and reactive to light. Conjunctivae and EOM are normal               Nose: without d/c or deformity               Neck: Neck supple. Gross normal ROM               Cardiovascular: Normal rate and regular rhythm.                 Pulmonary/Chest: Effort normal and breath sounds without rales or wheezing.                Abd:  Soft, NT, ND, + BS, no organomegaly               Neurological: Pt is alert. At baseline orientation, motor grossly intact               Skin: Skin is warm. No rashes, no other new lesions, LE edema - none               Psychiatric: Pt behavior is normal without agitation but depressed affect.   Micro: none  Cardiac tracings I have personally interpreted today:  none  Pertinent Radiological findings (summarize): none   Lab Results  Component Value Date   WBC 6.7 12/05/2021   HGB 14.0 12/05/2021   HCT 41.9 12/05/2021   PLT 210.0 12/05/2021   GLUCOSE 104 (H) 12/05/2021   CHOL 115 12/05/2021   TRIG 134.0 12/05/2021   HDL 34.10 (L) 12/05/2021   LDLDIRECT 171.2 03/19/2012   LDLCALC 54 12/05/2021   ALT 35 12/05/2021   AST 32 12/05/2021   NA 140 12/05/2021   K 4.7 12/05/2021   CL 104 12/05/2021   CREATININE 0.73 12/05/2021   BUN 11 12/05/2021   CO2 32 12/05/2021   TSH 0.87 02/11/2022   HGBA1C 6.3  12/05/2021   MICROALBUR <0.7 12/05/2021   Assessment/Plan:  Meagan Holmes is a 81 y.o. White or Caucasian [1] female with  has a past medical history of Allergic rhinitis, Allergic rhinitis (02/10/2007), Anxiety and depression (02/10/2007), Carotid artery disease ([place]) (05/21/2014), COPD (chronic obstructive  pulmonary disease) ([place]) (10/02/2010), Family history of early CAD (09/02/2017), Heart murmur (07/06/2014), Hematuria (2004), Hyperlipidemia (02/17/2009), Hypothyroidism, Nicotine dependence with nicotine-induced disorder (06/19/2017), Osteopenia, Osteoporosis (10/02/2010), Pre-diabetes (12/17/2017), Triceps tendonitis (12/14/2015), and Tricuspid regurgitation (05/31/2021).  History of Graves' disease For f/u TFTs today and refer endo per pt reqeust  Anxiety and depression Situational, no better with celexa, declines referral counseling, now for change celexa to wellbutrin XL 150 qd,  to f/u any worsening symptoms or concerns  Hypothyroidism For f/u TFts as above  Pre-diabetes Lab Results  Component Value Date   HGBA1C 6.3 12/05/2021   Stable, pt to continue current medical treatment  - for diet, wt control  Followup: Return in about 4 months (around 06/13/2022).  Cathlean Cower, MD 02/11/2022 8:53 PM [facility]

## 2022-02-11 NOTE — Assessment & Plan Note (Signed)
Situational, no better with celexa, declines referral counseling, now for change celexa to wellbutrin XL 150 qd,  to f/u any worsening symptoms or concerns

## 2022-02-11 NOTE — Assessment & Plan Note (Signed)
For f/u TFTs today and refer endo per pt reqeust

## 2022-02-13 ENCOUNTER — Telehealth: Payer: Self-pay

## 2022-02-13 NOTE — Telephone Encounter (Signed)
Patient is calling in stating she was previously on Celexa 10 mg and it was changed to Wellbutrin. Took the first pill yesterday of Wellbutrin and she said she did not like it as she felt tired but also jittery. Wanted to let Dr.John know that she will like to go back on Celexa

## 2022-02-15 MED ORDER — CITALOPRAM HYDROBROMIDE 10 MG PO TABS: 10.0000 mg | ORAL_TABLET | Freq: Every day | ORAL | 3 refills | Status: DC

## 2022-02-15 NOTE — Telephone Encounter (Signed)
Ok to change back to celexa 10 qd  - done erx to [employer]

## 2022-02-27 ENCOUNTER — Encounter: Payer: Self-pay | Admitting: *Deleted

## 2022-03-12 ENCOUNTER — Telehealth: Payer: Self-pay | Admitting: Internal Medicine

## 2022-03-12 NOTE — Telephone Encounter (Signed)
Patient would like to stop her Celexa - she wants to know how she can come off of it.  Please advise.

## 2022-03-12 NOTE — Telephone Encounter (Signed)
Patient states that celexa is not needed every day since she is taking the Ativan as needed and would like to come off the Celexa, please advise.

## 2022-03-12 NOTE — Telephone Encounter (Signed)
Informed patient to continue Celexa via voicemail as she verified her identity.

## 2022-03-12 NOTE — Telephone Encounter (Signed)
Since celexa is the non addictive medication, it is considered the MAIN therapy.  Please continue this, and only use the ativan as needed so she does not get hooked on it

## 2022-04-16 DIAGNOSIS — H16223 Keratoconjunctivitis sicca, not specified as Sjogren's, bilateral: Secondary | ICD-10-CM | POA: Diagnosis not present

## 2022-04-16 DIAGNOSIS — H40013 Open angle with borderline findings, low risk, bilateral: Secondary | ICD-10-CM | POA: Diagnosis not present

## 2022-04-16 DIAGNOSIS — H18413 Arcus senilis, bilateral: Secondary | ICD-10-CM | POA: Diagnosis not present

## 2022-04-16 DIAGNOSIS — Z961 Presence of intraocular lens: Secondary | ICD-10-CM | POA: Diagnosis not present

## 2022-06-12 ENCOUNTER — Ambulatory Visit: Payer: Medicare Other | Admitting: Internal Medicine

## 2022-06-13 ENCOUNTER — Ambulatory Visit (INDEPENDENT_AMBULATORY_CARE_PROVIDER_SITE_OTHER): Payer: Medicare Other | Admitting: Internal Medicine

## 2022-06-13 VITALS — BP 122/68 | HR 60 | Temp 98.1°F | Ht 63.0 in | Wt 153.0 lb

## 2022-06-13 DIAGNOSIS — R194 Change in bowel habit: Secondary | ICD-10-CM | POA: Diagnosis not present

## 2022-06-13 DIAGNOSIS — Z0001 Encounter for general adult medical examination with abnormal findings: Secondary | ICD-10-CM | POA: Diagnosis not present

## 2022-06-13 DIAGNOSIS — E538 Deficiency of other specified B group vitamins: Secondary | ICD-10-CM

## 2022-06-13 DIAGNOSIS — E782 Mixed hyperlipidemia: Secondary | ICD-10-CM | POA: Diagnosis not present

## 2022-06-13 DIAGNOSIS — E559 Vitamin D deficiency, unspecified: Secondary | ICD-10-CM | POA: Diagnosis not present

## 2022-06-13 DIAGNOSIS — I700 Atherosclerosis of aorta: Secondary | ICD-10-CM | POA: Diagnosis not present

## 2022-06-13 DIAGNOSIS — E039 Hypothyroidism, unspecified: Secondary | ICD-10-CM | POA: Diagnosis not present

## 2022-06-13 DIAGNOSIS — R7303 Prediabetes: Secondary | ICD-10-CM | POA: Diagnosis not present

## 2022-06-13 LAB — VITAMIN D 25 HYDROXY (VIT D DEFICIENCY, FRACTURES): VITD: 59.54 ng/mL (ref 30.00–100.00)

## 2022-06-13 LAB — URINALYSIS, ROUTINE W REFLEX MICROSCOPIC
Bilirubin Urine: NEGATIVE
Hgb urine dipstick: NEGATIVE
Ketones, ur: NEGATIVE
Nitrite: NEGATIVE
Specific Gravity, Urine: 1.015 (ref 1.000–1.030)
Total Protein, Urine: NEGATIVE
Urine Glucose: NEGATIVE
Urobilinogen, UA: 0.2 (ref 0.0–1.0)
pH: 6.5 (ref 5.0–8.0)

## 2022-06-13 LAB — HEPATIC FUNCTION PANEL
ALT: 20 U/L (ref 0–35)
AST: 23 U/L (ref 0–37)
Albumin: 4.2 g/dL (ref 3.5–5.2)
Alkaline Phosphatase: 33 U/L — ABNORMAL LOW (ref 39–117)
Bilirubin, Direct: 0.1 mg/dL (ref 0.0–0.3)
Total Bilirubin: 0.7 mg/dL (ref 0.2–1.2)
Total Protein: 7.5 g/dL (ref 6.0–8.3)

## 2022-06-13 LAB — CBC WITH DIFFERENTIAL/PLATELET
Basophils Absolute: 0 10*3/uL (ref 0.0–0.1)
Basophils Relative: 0.6 % (ref 0.0–3.0)
Eosinophils Absolute: 0.2 10*3/uL (ref 0.0–0.7)
Eosinophils Relative: 2.6 % (ref 0.0–5.0)
HCT: 40.7 % (ref 36.0–46.0)
Hemoglobin: 13.7 g/dL (ref 12.0–15.0)
Lymphocytes Relative: 22.3 % (ref 12.0–46.0)
Lymphs Abs: 1.6 10*3/uL (ref 0.7–4.0)
MCHC: 33.7 g/dL (ref 30.0–36.0)
MCV: 90 fl (ref 78.0–100.0)
Monocytes Absolute: 0.7 10*3/uL (ref 0.1–1.0)
Monocytes Relative: 9.1 % (ref 3.0–12.0)
Neutro Abs: 4.8 10*3/uL (ref 1.4–7.7)
Neutrophils Relative %: 65.4 % (ref 43.0–77.0)
Platelets: 213 10*3/uL (ref 150.0–400.0)
RBC: 4.52 Mil/uL (ref 3.87–5.11)
RDW: 13.9 % (ref 11.5–15.5)
WBC: 7.4 10*3/uL (ref 4.0–10.5)

## 2022-06-13 LAB — BASIC METABOLIC PANEL
BUN: 13 mg/dL (ref 6–23)
CO2: 29 mEq/L (ref 19–32)
Calcium: 9.7 mg/dL (ref 8.4–10.5)
Chloride: 102 mEq/L (ref 96–112)
Creatinine, Ser: 0.79 mg/dL (ref 0.40–1.20)
GFR: 69.88 mL/min (ref >60.00)
Glucose, Bld: 106 mg/dL — ABNORMAL HIGH (ref 70–99)
Potassium: 4.6 mEq/L (ref 3.5–5.1)
Sodium: 139 mEq/L (ref 135–145)

## 2022-06-13 LAB — MICROALBUMIN / CREATININE URINE RATIO
Creatinine,U: 102.8 mg/dL
Microalb Creat Ratio: 0.7 mg/g (ref 0.0–30.0)
Microalb, Ur: <0.7 mg/dL (ref 0.0–1.9)

## 2022-06-13 LAB — LIPID PANEL
Cholesterol: 123 mg/dL (ref 0–200)
HDL: 43.8 mg/dL (ref >39.00)
LDL Cholesterol: 67 mg/dL (ref 0–99)
NonHDL: 79.61
Total CHOL/HDL Ratio: 3
Triglycerides: 65 mg/dL (ref 0.0–149.0)
VLDL: 13 mg/dL (ref 0.0–40.0)

## 2022-06-13 LAB — HEMOGLOBIN A1C: Hgb A1c MFr Bld: 6.1 % (ref 4.6–6.5)

## 2022-06-13 LAB — VITAMIN B12: Vitamin B-12: 547 pg/mL (ref 211–911)

## 2022-06-13 LAB — T4, FREE: Free T4: 1.36 ng/dL (ref 0.60–1.60)

## 2022-06-13 LAB — TSH: TSH: 1.4 u[IU]/mL (ref 0.35–5.50)

## 2022-06-13 NOTE — Progress Notes (Signed)
Patient ID: Meagan Holmes, female   DOB: 23-Mar-1941, 82 y.o.   MRN: [id number]         Chief Complaint:: wellness exam and 54mofollow up (Previously ate a lot of peanuts and now has soft stools but is now gassy and has a bm 2-3 times a day)  , low thyroid, prediabetes, hld       HPI:  Meagan WIDMARKis a 82y.o. female here for wellness exam; for shingrix at pharmacy, o/w up to date                        Also c/o increased bowel frequency without fever, n/v, blood but increased gas and abd pains, bloating.  Pt denies chest pain, increased sob or doe, wheezing, orthopnea, PND, increased LE swelling, palpitations, dizziness or syncope.   Pt denies polydipsia, polyuria, or new focal neuro s/s.    Pt denies fever, wt loss, night sweats, loss of appetite, or other constitutional symptoms     Wt Readings from Last 3 Encounters:  06/13/22 153 lb (69.4 kg)  02/11/22 152 lb 8 oz (69.2 kg)  12/05/21 156 lb (70.8 kg)   BP Readings from Last 3 Encounters:  06/13/22 122/68  02/11/22 120/78  12/05/21 110/64   Immunization History  Administered Date(s) Administered   Influenza Split 02/13/2011, 01/21/2012, 02/01/2014   Influenza Whole 02/10/2007, 02/17/2008, 02/17/2009, 01/17/2010   Influenza, High Dose Seasonal PF 01/17/2013, 01/15/2022   Influenza,inj,quad, With Preservative 02/10/2019   Influenza-Unspecified 02/06/2015, 01/28/2016, 02/12/2017, 12/30/2017, 02/08/2020   PFIZER Comirnaty(Gray Top)Covid-19 Tri-Sucrose Vaccine 06/12/2019, 07/07/2019, 04/25/2020   Pneumococcal Conjugate-13 04/12/2014, 02/12/2017   Pneumococcal Polysaccharide-23 02/17/2008, 02/01/2014, 12/30/2017   Pneumococcal-Unspecified 12/30/2017   Td 02/10/2007, 07/30/2019   Td (Adult), 2 Lf Tetanus Toxid, Preservative Free 07/30/2019   Tdap 07/09/2018, 07/09/2018   Zoster Recombinat (Shingrix) 11/27/2018   Health Maintenance Due  Topic Date Due   Medicare Annual Wellness (AWV)  06/20/2022      Past Medical History:   Diagnosis Date   Allergic rhinitis    a lot better after septoplasty- remote   Allergic rhinitis 02/10/2007   Qualifier: Diagnosis of  By: PLarose KellsMD, JAlda Berthold    Anxiety and depression 02/10/2007   [place]   Carotid [place]: Bilat ICA 1-39   COPD [place] 10/02[place]   Family history of early CAD 09/02/2017   Heart murmur 07/06/2014   Hematuria 2004   (-) CT abd-pelvis (-) cystoscopy   Hyperlipidemia 02/17/2009   Qualifier: Diagnosis of  By: PLarose KellsMD, JHar[place]   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   Osteopenia    Osteoporosis 10/02/2010    DEXA  2008 T score -2.1 DEXA 02-2009 Tscore - 3.3 Started fosamax 2009, switched to later to actonel to increase compliance    Pre-diabetes 12/17/2017   Triceps tendonitis 12/14/2015   Tricuspid regurgitation 05/31/2021   Echo 2/23: EF 60-65, no RWMA, normal RVSF, normal PASP (RVSP 20.4), trivial MR, mild TR   Past Surgical History:  Procedure Laterality Date   APPENDECTOMY     CARPAL TUNNEL RELEASE     NASAL SEPTUM SURGERY     THYROIDECTOMY      reports that she quit smoking about 19 months ago. Her smoking use included cigarettes. She started smoking about 10 years ago. She has a 43.50 pack-year smoking history. She has never used smokeless tobacco. She  reports that she does not drink alcohol and does not use drugs. family history includes Colon cancer in an other family member; Diabetes in her mother; Heart attack in her father; Hypertension in her mother. Allergies  Allergen Reactions   Amoxil [Amoxicillin] Hives   Current Outpatient Medications on File Prior to Visit  Medication Sig Dispense Refill   atorvastatin (LIPITOR) 80 MG tablet Take 1 tablet (80 mg total) by mouth daily at 6 PM. 90 tablet 3   calcium gluconate 500 MG tablet Take 500 mg by mouth daily.     Cholecalciferol (VITAMIN D3 PO) Take 2,000 Units by mouth daily.       citalopram (CELEXA) 10 MG tablet Take 1 tablet (10 mg total) by mouth daily. 90 tablet 3   Cyanocobalamin (B-12) 500 MCG TABS      levocetirizine (XYZAL) 5 MG tablet TAKE 1 TABLET BY MOUTH EVERY DAY IN THE EVENING 30 tablet 11   levothyroxine (SYNTHROID) 75 MCG tablet Take 1 tablet (75 mcg total) by mouth daily. 90 tablet 3   LORazepam (ATIVAN) 0.5 MG tablet TAKE 1 TABLET BY MOUTH TWICE A DAY AS NEEDED FOR ANXIETY (Patient not taking: Reported on 02/11/2022) 60 tablet 1   No current facility-administered medications on file prior to visit.        ROS:  All others reviewed and negative.  Objective        PE:  BP 122/68 (BP Location: Right Arm, Patient Position: Sitting, Cuff Size: Large)   Pulse 60   Temp 98.1 F (36.7 C) (Oral)   Ht 5' 3"$  (1.6 m)   Wt 153 lb (69.4 kg)   SpO2 96%   BMI 27.10 kg/m                 Constitutional: Pt appears in NAD               HENT: Head: NCAT.                Right Ear: External ear normal.                 Left Ear: External ear normal.                Eyes: . Pupils are equal, round, and reactive to light. Conjunctivae and EOM are normal               Nose: without d/c or deformity               Neck: Neck supple. Gross normal ROM               Cardiovascular: Normal rate and regular rhythm.                 Pulmonary/Chest: Effort normal and breath sounds without rales or wheezing.                Abd:  Soft, NT, ND, + BS, no organomegaly               Neurological: Pt is alert. At baseline orientation, motor grossly intact               Skin: Skin is warm. No rashes, no other new lesions, LE edema - none               Psychiatric: Pt behavior is normal without agitation   Micro: none  Cardiac tracings I have personally interpreted today:  none  Pertinent Radiological findings (  summarize): none   Lab Results  Component Value Date   WBC 7.4 06/13/2022   HGB 13.7 06/13/2022   HCT 40.7 06/13/2022   PLT 213.0 06/13/2022   GLUCOSE 106 (H)  06/13/2022   CHOL 123 06/13/2022   TRIG 65.0 06/13/2022   HDL 43.80 06/13/2022   LDLDIRECT 171.2 03/19/2012   LDLCALC 67 06/13/2022   ALT 20 06/13/2022   AST 23 06/13/2022   NA 139 06/13/2022   K 4.6 06/13/2022   CL 102 06/13/2022   CREATININE 0.79 06/13/2022   BUN 13 06/13/2022   CO2 29 06/13/2022   TSH 1.40 06/13/2022   HGBA1C 6.1 06/13/2022   MICROALBUR <0.7 06/13/2022   Assessment/Plan:  Meagan Holmes is a 82 y.o. White or Caucasian [1] female with  has a past medical history of Allergic rhinitis, Allergic rhinitis (02/10/2007), Anxiety and depression (02/10/2007), Carotid artery disease ([place]) (05/21/2014), COPD (chronic obstructive pulmonary disease) ([place]) (10/02/2010), Family history of early CAD (09/02/2017), Heart murmur (07/06/2014), Hematuria (2004), Hyperlipidemia (02/17/2009), Hypothyroidism, Nicotine dependence with nicotine-induced disorder (06/19/2017), Osteopenia, Osteoporosis (10/02/2010), Pre-diabetes (12/17/2017), Triceps tendonitis (12/14/2015), and Tricuspid regurgitation (05/31/2021).  Encounter for well adult exam with abnormal findings Age and sex appropriate education and counseling updated with regular exercise and diet Referrals for preventative services - none needed Immunizations addressed - for shingrix at pharmacy Smoking counseling  - none needed Evidence for depression or other mood disorder - none significant Most recent labs reviewed. I have personally reviewed and have noted: 1) the patient's medical and social history 2) The patient's current medications and supplements 3) The patient's height, weight, and BMI have been recorded in the chart   Aortic atherosclerosis (HCC) Pt to continue exercise, low chol diet, lipitor 80 mg qd  Hyperlipidemia Lab Results  Component Value Date   LDLCALC 67 06/13/2022   Stable, pt to continue current statin lipitor 80 mg qd  Hypothyroidism Lab Results  Component Value Date   TSH 1.40 06/13/2022    Stable, pt to continue levothyroxine 75 mcg qd   Pre-diabetes Lab Results  Component Value Date   HGBA1C 6.1 06/13/2022   Stable, pt to continue current medical treatment  - diet, wt control   Increased bowel frequency Etiology unclear, 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  Cathlean Cower, MD 06/15/2022 4:40 PM [facility]

## 2022-06-13 NOTE — Patient Instructions (Signed)
Please take all new medication as prescribed - the dicyclomine as needed for the bowels  Please continue all other medications as before, and refills have been done if requested.  Please have the pharmacy call with any other refills you may need.  Please continue your efforts at being more active, low cholesterol diet, and weight control.  You are otherwise up to date with prevention measures today.  Please keep your appointments with your specialists as you may have planned  Please go to the LAB at the blood drawing area for the tests to be done  You will be contacted by phone if any changes need to be made immediately.  Otherwise, you will receive a letter about your results with an explanation, but please check with MyChart first.  Please remember to sign up for MyChart if you have not done so, as this will be important to you in the future with finding out test results, communicating by private email, and scheduling acute appointments online when needed.  Please make an Appointment to return in 6 months, or sooner if needed

## 2022-06-14 ENCOUNTER — Telehealth: Payer: Self-pay

## 2022-06-14 MED ORDER — DICYCLOMINE HCL 10 MG PO CAPS: 10.0000 mg | ORAL_CAPSULE | Freq: Three times a day (TID) | ORAL | 1 refills | Status: DC | PRN

## 2022-06-14 NOTE — Telephone Encounter (Signed)
Pt states she went to pharmacy to try and pick up the dicyclomine as needed for the bowel and was told they did not have rx for the said medication.  **Pt is asking that PCP please send in medication.

## 2022-06-14 NOTE — Telephone Encounter (Signed)
Ok this was corrected and sent

## 2022-06-15 ENCOUNTER — Encounter: Payer: Self-pay | Admitting: Internal Medicine

## 2022-06-15 NOTE — Assessment & Plan Note (Signed)

## 2022-06-15 NOTE — Assessment & Plan Note (Signed)
Lab Results  Component Value Date   LDLCALC 67 06/13/2022   Stable, pt to continue current statin lipitor 80 mg qd

## 2022-06-15 NOTE — Assessment & Plan Note (Signed)
Pt to continue exercise, low chol diet, lipitor 80 mg qd

## 2022-06-15 NOTE — Assessment & Plan Note (Signed)
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

## 2022-06-15 NOTE — Assessment & Plan Note (Signed)
Lab Results  Component Value Date   HGBA1C 6.1 06/13/2022   Stable, pt to continue current medical treatment  - diet, wt control

## 2022-06-15 NOTE — Assessment & Plan Note (Signed)
Lab Results  Component Value Date   TSH 1.40 06/13/2022   Stable, pt to continue levothyroxine 75 mcg qd

## 2022-06-17 ENCOUNTER — Other Ambulatory Visit: Payer: Self-pay | Admitting: Internal Medicine

## 2022-06-17 DIAGNOSIS — J309 Allergic rhinitis, unspecified: Secondary | ICD-10-CM

## 2022-06-27 ENCOUNTER — Other Ambulatory Visit: Payer: Self-pay | Admitting: Internal Medicine

## 2022-06-27 ENCOUNTER — Other Ambulatory Visit: Payer: Self-pay | Admitting: Physician Assistant

## 2022-06-27 DIAGNOSIS — E039 Hypothyroidism, unspecified: Secondary | ICD-10-CM

## 2022-07-08 ENCOUNTER — Other Ambulatory Visit: Payer: Self-pay | Admitting: Internal Medicine

## 2022-07-10 ENCOUNTER — Ambulatory Visit: Payer: Medicare Other

## 2022-07-10 NOTE — Progress Notes (Signed)
No charge. 

## 2022-07-22 NOTE — Progress Notes (Deleted)
Cardiology Office Note:    Date:  07/22/2022   ID:  Meagan Holmes, DOB [birth year], MRN [id number]  PCP:  Biagio Borg, MD  [facility] Cardiologist:  Freada Bergeron, MD Cardiology APP:  Liliane Shi, PA-C { Click to update primary MD,subspecialty MD or APP then REFRESH:1}  *** Referring MD: Biagio Borg, MD   Chief Complaint:  No chief complaint on file. {Click here for Visit Info    :1}    History of Present Illness:   Meagan Holmes is a 82 y.o. female with history of coronary calcification on CT 08/2017, carotid artery disease with LICA 60 to [id number] regressed down to 1 to 39% 2018 and stable 2020, hyperlipidemia, tobacco abuse.  Patient last saw Mr. Kathlen Mody 2023 and echo showed normal LVEF and trivial MR, mild TR. Carotid dopplers 1-39% bilateral.            Past Medical History:  Diagnosis Date   Allergic rhinitis    a lot better after septoplasty- remote   Allergic rhinitis 02/10/2007   Qualifier: Diagnosis of  By: Larose Kells MD, [place]    Anxiety and depression 02/10/2007   Carotid artery disease ([place]) 05/21/2014   Carotid US 2/23: Bilat ICA 1-39   COPD (chronic obstructive pulmonary disease) ([place]) 10/02/2010   Family history of early CAD 09/02/2017   Heart murmur 07/06/2014   Hematuria 2004   (-) CT abd-pelvis (-) cystoscopy   Hyperlipidemia 02/17/2009   Qualifier: Diagnosis of  By: Larose Kells MD, [place]    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   Osteopenia    Osteoporosis 10/02/2010    DEXA  2008 T score -2.1 DEXA 02-2009 Tscore - 3.3 Started fosamax 2009, switched to later to actonel to increase compliance    Pre-diabetes 12/17/2017   Triceps tendonitis 12/14/2015   Tricuspid regurgitation 05/31/2021   Echo 2/23: EF 60-65, no RWMA, normal RVSF, normal PASP (RVSP 20.4), trivial MR, mild TR   Current Medications: No outpatient medications have been marked as taking for the 07/31/22 encounter  (Appointment) with Imogene Burn, PA-C.    Allergies:   Amoxil [amoxicillin]   Social History   Tobacco Use   Smoking status: Former    Packs/day: 0.75    Years: 58.00    Additional pack years: 0.00    Total pack years: 43.50    Types: Cigarettes    Start date: 04/29/2012    Quit date: 10/30/2020    Years since quitting: 1.7   Smokeless tobacco: Never   Tobacco comments:    1/2 ppd  Substance Use Topics   Alcohol use: No    Alcohol/week: 0.0 standard drinks of alcohol   Drug use: No    Family Hx: The patient's family history includes Colon cancer in an other family member; Diabetes in her mother; Heart attack in her father; Hypertension in her mother. There is no history of Breast cancer, Cancer, Heart disease, or Stroke.  ROS     Physical Exam:    VS:  There were no vitals taken for this visit.    Wt Readings from Last 3 Encounters:  06/13/22 153 lb (69.4 kg)  02/11/22 152 lb 8 oz (69.2 kg)  12/05/21 156 lb (70.8 kg)    Physical Exam  GEN: Well nourished, well developed, in no acute distress  HEENT: normal  Neck: no JVD, carotid bruits, or masses Cardiac:RRR; no murmurs, rubs, or gallops  Respiratory:  clear to auscultation bilaterally, normal work of breathing GI: soft, nontender, nondistended, + BS Ext: without cyanosis, clubbing, or edema, Good distal pulses bilaterally MS: no deformity or atrophy  Skin: warm and dry, no rash Neuro:  Alert and Oriented x 3, Strength and sensation are intact Psych: euthymic mood, full affect        EKGs/Labs/Other Test Reviewed:    EKG:  EKG is *** ordered today.  The ekg ordered today demonstrates ***  Recent Labs: 06/13/2022: ALT 20; BUN 13; Creatinine, Ser 0.79; Hemoglobin 13.7; Platelets 213.0; Potassium 4.6; Sodium 139; TSH 1.40   Recent Lipid Panel Recent Labs    06/13/22 1118  CHOL 123  TRIG 65.0  HDL 43.80  VLDL 13.0  LDLCALC 67     Prior CV Studies: {Select studies to display:26339}   Echo  05/2021 IMPRESSIONS     1. Left ventricular ejection fraction, by estimation, is 60 to 65%. Left  ventricular ejection fraction by 3D volume is 61 %. The left ventricle has  normal function. The left ventricle has no regional wall motion  abnormalities. Left ventricular diastolic   parameters were normal.   2. Right ventricular systolic function is normal. The right ventricular  size is normal. There is normal pulmonary artery systolic pressure. The  estimated right ventricular systolic pressure is [id number] mmHg.   3. The mitral valve is normal in structure. Trivial mitral valve  regurgitation. No evidence of mitral stenosis.   4. The aortic valve is normal in structure. Aortic valve regurgitation is  not visualized. No aortic stenosis is present.   5. The inferior vena cava is normal in size with greater than 50%  respiratory variability, suggesting right atrial pressure of 3 mmHg.     Risk Assessment/Calculations/Metrics:   {Does this patient have ATRIAL FIBRILLATION?:[id number]}     No BP recorded.  {Refresh Note OR Click here to enter BP  :1}***    ASSESSMENT & PLAN:   No problem-specific Assessment & Plan notes found for this encounter.   . Calcification of coronary artery No anginal symptoms.  Continue ASA, statin. 150 min exercise weekly   2. Aortic atherosclerosis (HCC) Continue ASA, statin.     Bilateral carotid artery disease, unspecified type ([place]) 1-39% 05/2021   4. Mixed hyperlipidemia LDL 62, trig 57 07/2019  .     5. Tobacco abuse She is smoking 1/2 ppd.  I have recommended that she quit. Needs CT screening.            {Are you ordering a CV Procedure (e.g. stress test, cath, DCCV, TEE, etc)?   Press F2        :[id number]   Dispo:  No follow-ups on file.   Medication Adjustments/Labs and Tests Ordered: Current medicines are reviewed at length with the patient today.  Concerns regarding medicines are outlined above.  Tests Ordered: No orders of the defined  types were placed in this encounter.  Medication Changes: No orders of the defined types were placed in this encounter.  Sumner Boast, PA-C  07/22/2022 8:44 AM    [facility], [place], [place]  [address] Phone: [phone]; Fax: [phone]

## 2022-07-28 NOTE — Progress Notes (Unsigned)
Cardiology Office Note:   Date:  07/29/2022  ID:  MAEBEL TEGETHOFF, DOB 1940-06-20, MRN [id number]  History of Present Illness:   Meagan Holmes is a 82 y.o. female with history of carotid artery disease, HLD, tobacco use and COPD who presents to clinic for follow-up. Previously followed by Dr. [name].  Was last seen in clinic by Richardson Dopp on 04/2021 where she was doing well from a CV standpoint. Had quit smoking.  Today, the patient feels well. No chest pain, SOB, orthopnea, LE edema, orthopnea or PND. Does yoga twice per week without anginal symptoms. Quit smoking 10/30/2020.   LDL cholesterol 67 (05/2022)   ROS: As per HPI  Studies Reviewed:    EKG:  NSR with HR 66-personally reviewed  Cardiac Studies & Procedures       ECHOCARDIOGRAM  ECHOCARDIOGRAM COMPLETE 05/31/2021  Narrative ECHOCARDIOGRAM REPORT    Patient Name:   [name] Date of Exam: 05/31/2021 Medical Rec #:  [id number]       Height:       63.0 in Accession #:    [id number]      Weight:       159.0 lb Date of Birth:  [birth year]       BSA:          1.754 m Patient Age:    31 years        BP:           143/83 mmHg Patient Gender: F               HR:           63 bpm. Exam Location:  [facility]  Procedure: 2D Echo, 3D Echo, Cardiac Doppler and Color Doppler  Indications:    R01.1 Murmur  History:        Patient has prior history of Echocardiogram examinations, most recent 09/10/2017. Carotid Disease and HLD.  Sonographer:    Marygrace Drought RCS Referring Phys: [place]   1. Left ventricular ejection fraction, by estimation, is 60 to 65%. Left ventricular ejection fraction by 3D volume is 61 %. The left ventricle has normal function. The left ventricle has no regional wall motion abnormalities. Left ventricular diastolic parameters were normal. 2. Right ventricular systolic function is normal. The right ventricular size is normal. There is normal pulmonary artery systolic  pressure. The estimated right ventricular systolic pressure is A999333 mmHg. 3. The mitral valve is normal in structure. Trivial mitral valve regurgitation. No evidence of mitral stenosis. 4. The aortic valve is normal in structure. Aortic valve regurgitation is not visualized. No aortic stenosis is present. 5. The inferior vena cava is normal in size with greater than 50% respiratory variability, suggesting right atrial pressure of 3 mmHg.  FINDINGS Left Ventricle: Left ventricular ejection fraction, by estimation, is 60 to 65%. Left ventricular ejection fraction by 3D volume is 61 %. The left ventricle has normal function. The left ventricle has no regional wall motion abnormalities. The left ventricular internal cavity size was normal in size. There is no left ventricular hypertrophy. Left ventricular diastolic parameters were normal. Normal left ventricular filling pressure.  Right Ventricle: The right ventricular size is normal. No increase in right ventricular wall thickness. Right ventricular systolic function is normal. There is normal pulmonary artery systolic pressure. The tricuspid regurgitant velocity is 2.52 m/s, and with an assumed right atrial pressure of 3 mmHg, the estimated right ventricular systolic pressure is [id number] mmHg.  Left Atrium: Left atrial size was normal in size.  Right Atrium: Right atrial size was normal in size.  Pericardium: There is no evidence of pericardial effusion.  Mitral Valve: The mitral valve is normal in structure. Trivial mitral valve regurgitation. No evidence of mitral valve stenosis.  Tricuspid Valve: The tricuspid valve is normal in structure. Tricuspid valve regurgitation is mild . No evidence of tricuspid stenosis.  Aortic Valve: The aortic valve is normal in structure. Aortic valve regurgitation is not visualized. No aortic stenosis is present.  Pulmonic Valve: The pulmonic valve was normal in structure. Pulmonic valve regurgitation is not  visualized. No evidence of pulmonic stenosis.  Aorta: The aortic root is normal in size and structure.  Venous: The inferior vena cava is normal in size with greater than 50% respiratory variability, suggesting right atrial pressure of 3 mmHg.  IAS/Shunts: No atrial level shunt detected by color flow Doppler.   LEFT VENTRICLE PLAX 2D LVIDd:         4.00 cm         Diastology LVIDs:         2.60 cm         LV e' medial:    9.90 cm/s LV PW:         0.90 cm         LV E/e' medial:  9.8 LV IVS:        0.80 cm         LV e' lateral:   9.57 cm/s LVOT diam:     1.60 cm         LV E/e' lateral: 10.2 LV SV:         47 LV SV Index:   27 LVOT Area:     2.01 cm        3D Volume EF LV 3D EF:    Left ventricul ar ejection fraction by 3D volume is 61 %.  3D Volume EF: 3D EF:        61 % LV EDV:       96 ml LV ESV:       37 ml LV SV:        59 ml  RIGHT VENTRICLE RV Basal diam:  3.10 cm RV S prime:     15.20 cm/s TAPSE (M-mode): 2.4 cm RVSP:           28.4 mmHg  LEFT ATRIUM             Index        RIGHT ATRIUM           Index LA diam:        2.70 cm 1.54 cm/m   RA Pressure: 3.00 mmHg LA Vol (A2C):   37.9 ml 21.61 ml/m  RA Area:     12.00 cm LA Vol (A4C):   36.4 ml 20.75 ml/m  RA Volume:   27.10 ml  15.45 ml/m LA Biplane Vol: 40.3 ml 22.97 ml/m AORTIC VALVE LVOT Vmax:   96.60 cm/s LVOT Vmean:  64.600 cm/s LVOT VTI:    0.234 m  AORTA Ao Root diam: 2.90 cm Ao Asc diam:  3.50 cm  MITRAL VALVE               TRICUSPID VALVE MV Area (PHT):             TR Peak grad:   25.4 mmHg MV Decel Time:  TR Vmax:        252.00 cm/s MV E velocity: 97.50 cm/s  Estimated RAP:  3.00 mmHg MV A velocity: 96.40 cm/s  RVSP:           28.4 mmHg MV E/A ratio:  1.01 SHUNTS Systemic VTI:  0.23 m Systemic Diam: 1.60 cm  Fransico Him MD Electronically signed by Fransico Him MD Signature Date/Time: 05/31/2021/1:20:13 PM    Final             Risk Assessment/Calculations:               Physical Exam:   VS:  BP 124/70 (BP Location: Left Arm, Patient Position: Sitting, Cuff Size: Normal)   Pulse 66   Ht 5\' 3"  (1.6 m)   Wt 153 lb (69.4 kg)   BMI 27.10 kg/m    Wt Readings from Last 3 Encounters:  07/29/22 153 lb (69.4 kg)  06/13/22 153 lb (69.4 kg)  02/11/22 152 lb 8 oz (69.2 kg)     GEN: Well nourished, well developed in no acute distress NECK: No JVD; No carotid bruits CARDIAC: RRR, 2/6 systolic murmur RESPIRATORY:  Clear to auscultation without rales, wheezing or rhonchi  ABDOMEN: Soft, non-tender, non-distended EXTREMITIES:  No edema; No deformity   ASSESSMENT AND PLAN:   #Carotid artery stenosis: -Mild disease 1-39% bilaterally on 05/2021; will repeat in 2025 -Continue lipitor 80mg  daily -Continue ASA 81mg  daily  #Coronary Artery Ca: -No anginal symptoms -Continue lipitor 80mg  daily -Continue ASA 81mg  daily  #HLD: #Aortic Atherosclerosis: -Continue lipitor 80mg  daily -LDL 67 ([id number])  #Systolic Murmur: -No significant valve disease on TTE        Signed, Freada Bergeron, MD

## 2022-07-29 ENCOUNTER — Encounter: Payer: Self-pay | Admitting: Cardiology

## 2022-07-29 ENCOUNTER — Ambulatory Visit: Payer: Medicare Other | Attending: Cardiology | Admitting: Cardiology

## 2022-07-29 VITALS — BP 124/70 | HR 66 | Ht 63.0 in | Wt 153.0 lb

## 2022-07-29 DIAGNOSIS — I2510 Atherosclerotic heart disease of native coronary artery without angina pectoris: Secondary | ICD-10-CM

## 2022-07-29 DIAGNOSIS — I2584 Coronary atherosclerosis due to calcified coronary lesion: Secondary | ICD-10-CM

## 2022-07-29 DIAGNOSIS — E782 Mixed hyperlipidemia: Secondary | ICD-10-CM | POA: Diagnosis not present

## 2022-07-29 DIAGNOSIS — R011 Cardiac murmur, unspecified: Secondary | ICD-10-CM | POA: Diagnosis not present

## 2022-07-29 DIAGNOSIS — I700 Atherosclerosis of aorta: Secondary | ICD-10-CM

## 2022-07-29 DIAGNOSIS — I6523 Occlusion and stenosis of bilateral carotid arteries: Secondary | ICD-10-CM | POA: Diagnosis not present

## 2022-07-29 MED ORDER — ATORVASTATIN CALCIUM 80 MG PO TABS: 80.0000 mg | ORAL_TABLET | Freq: Every day | ORAL | 3 refills | Status: DC

## 2022-07-29 NOTE — Patient Instructions (Signed)
Medication Instructions:  Your physician recommends that you continue on your current medications as directed. Please refer to the Current Medication list given to you today.  *If you need a refill on your cardiac medications before your next appointment, please call your pharmacy*  Lab Work: None ordered  Testing/Procedures: None ordered  Follow-Up: At [facility], you and your health needs are our priority.  As part of our continuing mission to provide you with exceptional heart care, we have created designated Provider Care Teams.  These Care Teams include your primary Cardiologist (physician) and Advanced Practice Providers (APPs -  Physician Assistants and Nurse Practitioners) who all work together to provide you with the care you need, when you need it.  Your next appointment:   1 year(s)  The format for your next appointment:   In Person  Provider:   Freada Bergeron, MD {

## 2022-07-31 ENCOUNTER — Ambulatory Visit: Payer: Medicare Other | Admitting: Physician Assistant

## 2022-08-28 ENCOUNTER — Ambulatory Visit: Payer: Medicare Other | Admitting: Internal Medicine

## 2022-08-28 ENCOUNTER — Encounter: Payer: Self-pay | Admitting: Internal Medicine

## 2022-08-28 VITALS — BP 118/70 | HR 63 | Ht 63.0 in | Wt 154.0 lb

## 2022-08-28 DIAGNOSIS — Z8639 Personal history of other endocrine, nutritional and metabolic disease: Secondary | ICD-10-CM

## 2022-08-28 DIAGNOSIS — E890 Postprocedural hypothyroidism: Secondary | ICD-10-CM

## 2022-08-28 NOTE — Patient Instructions (Signed)

## 2022-08-28 NOTE — Progress Notes (Unsigned)
Name: Meagan Holmes  MRN/ DOB: [id number], [birth year]    Age/ Sex: 82 y.o., female    PCP: Corwin Levins, MD   Reason for Endocrinology Evaluation: Luiz Blare' disease     Date of Initial Endocrinology Evaluation: 08/28/2022     HPI: Meagan Holmes is a 82 y.o. female with a past medical history of Hx of Graves' disease, depression. The patient presented for initial endocrinology clinic visit on 08/28/2022 for consultative assistance with her Luiz Blare' disease.   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    Anxiety and depression 02/10/2007   Carotid artery disease (HCC) 05/21/2014   Carotid US 2/23: Bilat ICA 1-39   COPD (chronic obstructive pulmonary disease) (HCC) 10/02/2010   Family history of early CAD 09/02/2017   Heart murmur 07/06/2014   Hematuria 2004   (-) CT abd-pelvis (-) cystoscopy   Hyperlipidemia 02/17/2009   Qualifier: Diagnosis of  By: Drue Novel MD, Nolon Rod.    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   Osteopenia    Osteoporosis 10/02/2010    DEXA  2008 T score -2.1 DEXA 02-2009 Tscore - 3.3 Started fosamax 2009, switched to later to actonel to increase compliance    Pre-diabetes 12/17/2017   Triceps tendonitis 12/14/2015   Tricuspid regurgitation 05/31/2021   Echo 2/23: EF 60-65, no RWMA, normal RVSF, normal PASP (RVSP 20.4), trivial MR, mild TR   Past Surgical History:  Past Surgical History:  Procedure Laterality  Date   APPENDECTOMY     CARPAL TUNNEL RELEASE     NASAL SEPTUM SURGERY     THYROIDECTOMY      Social History:  reports that she quit smoking about 21 months ago. Her smoking use included cigarettes. She started smoking about 10 years ago. She has a 43.50 pack-year smoking history. She has never used smokeless tobacco. She reports that she does not drink alcohol and does not use drugs. Family History: family history includes Colon cancer in an other family member; Diabetes in her mother; Heart attack in her father; Hypertension in her mother.   HOME MEDICATIONS: Allergies as of 08/28/2022       Reactions   Amoxil [amoxicillin] Hives        Medication List        Accurate as of Aug 28, 2022  7:29 AM. If you have any questions, ask your nurse or doctor.          atorvastatin 80 MG tablet Commonly known as: LIPITOR Take 1 tablet (80 mg total) by mouth daily.   B-12 500 MCG Tabs   calcium gluconate 500 MG tablet Take 500 mg by mouth daily.   citalopram 10 MG tablet Commonly known as: CeleXA Take 1 tablet (10 mg total) by mouth daily.   dicyclomine 10 MG capsule Commonly known as: BENTYL TAKE 1 CAPSULE (10 MG TOTAL) BY MOUTH 3 (THREE) TIMES  DAILY AS NEEDED FOR SPASMS.   levocetirizine 5 MG tablet Commonly known as: XYZAL TAKE 1 TABLET BY MOUTH EVERY DAY IN THE EVENING   levothyroxine 75 MCG tablet Commonly known as: SYNTHROID Take 1 tablet (75 mcg total) by mouth daily.   LORazepam 0.5 MG tablet Commonly known as: ATIVAN TAKE 1 TABLET BY MOUTH TWICE A DAY AS NEEDED FOR ANXIETY   VITAMIN D3 PO Take 2,000 Units by mouth daily.          REVIEW OF SYSTEMS: A comprehensive ROS was conducted with the patient and is negative except as per HPI and below:  ROS     OBJECTIVE:  VS: There were no vitals taken for this visit.   Wt Readings from Last 3 Encounters:  07/29/22 153 lb (69.4 kg)  06/13/22 153 lb (69.4 kg)  02/11/22 152 lb 8 oz (69.2 kg)      EXAM: General: Pt appears well and is in NAD  Eyes: External eye exam normal without stare, lid lag or exophthalmos.  EOM intact.  PERRL.  Neck: General: Supple without adenopathy. Thyroid: Thyroid size normal.  No goiter or nodules appreciated. No thyroid bruit.  Lungs: Clear with good BS bilat with no rales, rhonchi, or wheezes  Heart: Auscultation: RRR.  Abdomen: Normoactive bowel sounds, soft, nontender, without masses or organomegaly palpable  Extremities:  BL LE: No pretibial edema normal ROM and strength.  Mental Status: Judgment, insight: Intact Orientation: Oriented to time, place, and person Mood and affect: No depression, anxiety, or agitation     DATA REVIEWED: ***    ASSESSMENT/PLAN/RECOMMENDATIONS:   Hypothyroid      Medications :   2. Hx Graves' disease  Signed electronically by: Lyndle Herrlich, MD  [facility] Endocrinology  [facility] [address] [place] [place], [place] [address] Phone: [phone] FAX: [phone]   CC: Corwin Levins, MD [address] [place] [address] Phone: [phone] Fax: [phone]   Return to Endocrinology clinic as below: Future Appointments  Date Time Provider Department Center  08/28/2022 10:30 AM Latiesha Harada, Konrad Dolores, MD [facility] None

## 2022-09-04 ENCOUNTER — Other Ambulatory Visit: Payer: Medicare Other

## 2022-09-06 ENCOUNTER — Other Ambulatory Visit (INDEPENDENT_AMBULATORY_CARE_PROVIDER_SITE_OTHER): Payer: Medicare Other

## 2022-09-06 DIAGNOSIS — E890 Postprocedural hypothyroidism: Secondary | ICD-10-CM

## 2022-09-06 LAB — T4, FREE: Free T4: 0.9 ng/dL (ref 0.60–1.60)

## 2022-09-06 LAB — TSH: TSH: 2.11 u[IU]/mL (ref 0.35–5.50)

## 2022-10-23 ENCOUNTER — Ambulatory Visit (INDEPENDENT_AMBULATORY_CARE_PROVIDER_SITE_OTHER): Payer: Medicare Other

## 2022-10-23 VITALS — Ht 63.0 in | Wt 153.0 lb

## 2022-10-23 DIAGNOSIS — Z0000 Encounter for general adult medical examination without abnormal findings: Secondary | ICD-10-CM

## 2022-10-23 NOTE — Patient Instructions (Addendum)
[name] , Thank you for taking time to come for your Medicare Wellness Visit. I appreciate your ongoing commitment to your health goals. Please review the following plan we discussed and let me know if I can assist you in the future.   These are the goals we discussed:  Goals      My healthcare goal for 2024 is to maintain my current health status by continuing to eat healthy, stay independent, physically and socially active.        This is a list of the screening recommended for you and due dates:  Health Maintenance  Topic Date Due   Zoster (Shingles) Vaccine (2 of 2) 01/22/2019   Flu Shot  11/28/2022   Medicare Annual Wellness Visit  10/23/2023   DTaP/Tdap/Td vaccine (6 - Td or Tdap) 07/29/2029   Pneumonia Vaccine  Completed   DEXA scan (bone density measurement)  Completed   HPV Vaccine  Aged Out   COVID-19 Vaccine  Discontinued    Advanced directives: Yes  Conditions/risks identified: Yes  Next appointment: It was nice speaking with you today!  Please follow up in one year for your annual wellness visit via telephone call with Nurse Percell Miller on 10/29/2023 at 3:00 p.m.  If you need to cancel or reschedule please call [phone].  Preventive Care 32 Years and Older, Female Preventive care refers to lifestyle choices and visits with your health care provider that can promote health and wellness. What does preventive care include? A yearly physical exam. This is also called an annual well check. Dental exams once or twice a year. Routine eye exams. Ask your health care provider how often you should have your eyes checked. Personal lifestyle choices, including: Daily care of your teeth and gums. Regular physical activity. Eating a healthy diet. Avoiding tobacco and drug use. Limiting alcohol use. Practicing safe sex. Taking low-dose aspirin every day. Taking vitamin and mineral supplements as recommended by your health care provider. What happens during an annual well  check? The services and screenings done by your health care provider during your annual well check will depend on your age, overall health, lifestyle risk factors, and family history of disease. Counseling  Your health care provider may ask you questions about your: Alcohol use. Tobacco use. Drug use. Emotional well-being. Home and relationship well-being. Sexual activity. Eating habits. History of falls. Memory and ability to understand (cognition). Work and work Astronomer. Reproductive health. Screening  You may have the following tests or measurements: Height, weight, and BMI. Blood pressure. Lipid and cholesterol levels. These may be checked every 5 years, or more frequently if you are over 52 years old. Skin check. Lung cancer screening. You may have this screening every year starting at age 34 if you have a 30-pack-year history of smoking and currently smoke or have quit within the past 15 years. Fecal occult blood test (FOBT) of the stool. You may have this test every year starting at age 42. Flexible sigmoidoscopy or colonoscopy. You may have a sigmoidoscopy every 5 years or a colonoscopy every 10 years starting at age 43. Hepatitis C blood test. Hepatitis B blood test. Sexually transmitted disease (STD) testing. Diabetes screening. This is done by checking your blood sugar (glucose) after you have not eaten for a while (fasting). You may have this done every 1-3 years. Bone density scan. This is done to screen for osteoporosis. You may have this done starting at age 35. Mammogram. This may be done every 1-2 years. Talk  to your health care provider about how often you should have regular mammograms. Talk with your health care provider about your test results, treatment options, and if necessary, the need for more tests. Vaccines  Your health care provider may recommend certain vaccines, such as: Influenza vaccine. This is recommended every year. Tetanus, diphtheria, and  acellular pertussis (Tdap, Td) vaccine. You may need a Td booster every 10 years. Zoster vaccine. You may need this after age 26. Pneumococcal 13-valent conjugate (PCV13) vaccine. One dose is recommended after age 77. Pneumococcal polysaccharide (PPSV23) vaccine. One dose is recommended after age 73. Talk to your health care provider about which screenings and vaccines you need and how often you need them. This information is not intended to replace advice given to you by your health care provider. Make sure you discuss any questions you have with your health care provider. Document Released: 05/12/2015 Document Revised: 01/03/2016 Document Reviewed: 02/14/2015 Elsevier Interactive Patient Education  2017 [employer] Prevention in the Home Falls can cause injuries. They can happen to people of all ages. There are many things you can do to make your home safe and to help prevent falls. What can I do on the outside of my home? Regularly fix the edges of walkways and driveways and fix any cracks. Remove anything that might make you trip as you walk through a door, such as a raised step or threshold. Trim any bushes or trees on the path to your home. Use bright outdoor lighting. Clear any walking paths of anything that might make someone trip, such as rocks or tools. Regularly check to see if handrails are loose or broken. Make sure that both sides of any steps have handrails. Any raised decks and porches should have guardrails on the edges. Have any leaves, snow, or ice cleared regularly. Use sand or salt on walking paths during winter. Clean up any spills in your garage right away. This includes oil or grease spills. What can I do in the bathroom? Use night lights. Install grab bars by the toilet and in the tub and shower. Do not use towel bars as grab bars. Use non-skid mats or decals in the tub or shower. If you need to sit down in the shower, use a plastic, non-slip stool. Keep  the floor dry. Clean up any water that spills on the floor as soon as it happens. Remove soap buildup in the tub or shower regularly. Attach bath mats securely with double-sided non-slip rug tape. Do not have throw rugs and other things on the floor that can make you trip. What can I do in the bedroom? Use night lights. Make sure that you have a light by your bed that is easy to reach. Do not use any sheets or blankets that are too big for your bed. They should not hang down onto the floor. Have a firm chair that has side arms. You can use this for support while you get dressed. Do not have throw rugs and other things on the floor that can make you trip. What can I do in the kitchen? Clean up any spills right away. Avoid walking on wet floors. Keep items that you use a lot in easy-to-reach places. If you need to reach something above you, use a strong step stool that has a grab bar. Keep electrical cords out of the way. Do not use floor polish or wax that makes floors slippery. If you must use wax, use non-skid floor wax. Do  not have throw rugs and other things on the floor that can make you trip. What can I do with my stairs? Do not leave any items on the stairs. Make sure that there are handrails on both sides of the stairs and use them. Fix handrails that are broken or loose. Make sure that handrails are as long as the stairways. Check any carpeting to make sure that it is firmly attached to the stairs. Fix any carpet that is loose or worn. Avoid having throw rugs at the top or bottom of the stairs. If you do have throw rugs, attach them to the floor with carpet tape. Make sure that you have a light switch at the top of the stairs and the bottom of the stairs. If you do not have them, ask someone to add them for you. What else can I do to help prevent falls? Wear shoes that: Do not have high heels. Have rubber bottoms. Are comfortable and fit you well. Are closed at the toe. Do not  wear sandals. If you use a stepladder: Make sure that it is fully opened. Do not climb a closed stepladder. Make sure that both sides of the stepladder are locked into place. Ask someone to hold it for you, if possible. Clearly mark and make sure that you can see: Any grab bars or handrails. First and last steps. Where the edge of each step is. Use tools that help you move around (mobility aids) if they are needed. These include: Canes. Walkers. Scooters. Crutches. Turn on the lights when you go into a dark area. Replace any light bulbs as soon as they burn out. Set up your furniture so you have a clear path. Avoid moving your furniture around. If any of your floors are uneven, fix them. If there are any pets around you, be aware of where they are. Review your medicines with your doctor. Some medicines can make you feel dizzy. This can increase your chance of falling. Ask your doctor what other things that you can do to help prevent falls. This information is not intended to replace advice given to you by your health care provider. Make sure you discuss any questions you have with your health care provider. Document Released: 02/09/2009 Document Revised: 09/21/2015 Document Reviewed: 05/20/2014 Elsevier Interactive Patient Education  2017 [employer].

## 2022-10-23 NOTE — Progress Notes (Signed)
Subjective:   Meagan Holmes is a 82 y.o. female who presents for Medicare Annual (Subsequent) preventive examination.  Visit Complete: Virtual  I connected with  [name] on 10/23/22 by a audio enabled telemedicine application and verified that I am speaking with the correct person using two identifiers.  Patient Location: Home  Provider Location: Office/Clinic  I discussed the limitations of evaluation and management by telemedicine. The patient expressed understanding and agreed to proceed.  Review of Systems     Cardiac Risk Factors include: advanced age (>59men, >1 women);dyslipidemia;family history of premature cardiovascular disease     Objective:    Today's Vitals   10/23/22 1333  Weight: 153 lb (69.4 kg)  Height: 5\' 3"  (1.6 m)  PainSc: 0-No pain   Body mass index is 27.1 kg/m.     10/23/2022    1:34 PM 06/20/2021   11:23 AM  Advanced Directives  Does Patient Have a Medical Advance Directive? Yes Yes  Type of Estate agent of [place];Living will Living will  Does patient want to make changes to medical advance directive?  No - Patient declined  Copy of Healthcare Power of Attorney in Chart? No - copy requested     Current Medications (verified) Outpatient Encounter Medications as of 10/23/2022  Medication Sig   atorvastatin (LIPITOR) 80 MG tablet Take 1 tablet (80 mg total) by mouth daily.   calcium gluconate 500 MG tablet Take 500 mg by mouth daily.   Cholecalciferol (VITAMIN D3 PO) Take 2,000 Units by mouth daily.    citalopram (CELEXA) 10 MG tablet Take 1 tablet (10 mg total) by mouth daily.   Cyanocobalamin (B-12) 500 MCG TABS    dicyclomine (BENTYL) 10 MG capsule TAKE 1 CAPSULE (10 MG TOTAL) BY MOUTH 3 (THREE) TIMES DAILY AS NEEDED FOR SPASMS.   levocetirizine (XYZAL) 5 MG tablet TAKE 1 TABLET BY MOUTH EVERY DAY IN THE EVENING   levothyroxine (SYNTHROID) 75 MCG tablet Take 1 tablet (75 mcg total) by mouth daily.    LORazepam (ATIVAN) 0.5 MG tablet TAKE 1 TABLET BY MOUTH TWICE A DAY AS NEEDED FOR ANXIETY   No facility-administered encounter medications on file as of 10/23/2022.    Allergies (verified) Amoxil [amoxicillin]   History: Past Medical History:  Diagnosis Date   Allergic rhinitis    a lot better after septoplasty- remote   Allergic rhinitis 02/10/2007   Qualifier: Diagnosis of  By: Drue Novel MD, Nolon Rod.    Anxiety and depression 02/10/2007   Carotid artery disease (HCC) 05/21/2014   Carotid US 2/23: Bilat ICA 1-39   COPD (chronic obstructive pulmonary disease) (HCC) 10/02/2010   Family history of early CAD 09/02/2017   Heart murmur 07/06/2014   Hematuria 2004   (-) CT abd-pelvis (-) cystoscopy   Hyperlipidemia 02/17/2009   Qualifier: Diagnosis of  By: Drue Novel MD, Nolon Rod.    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   Osteopenia    Osteoporosis 10/02/2010    DEXA  2008 T score -2.1 DEXA 02-2009 Tscore - 3.3 Started fosamax 2009, switched to later to actonel to increase compliance    Pre-diabetes 12/17/2017   Triceps tendonitis 12/14/2015   Tricuspid regurgitation 05/31/2021   Echo 2/23: EF 60-65, no RWMA, normal RVSF, normal PASP (RVSP 20.4), trivial MR, mild TR   Past Surgical History:  Procedure Laterality Date   APPENDECTOMY     CARPAL TUNNEL RELEASE     NASAL SEPTUM SURGERY  THYROIDECTOMY     Family History  Problem Relation Age of Onset   Heart attack Father    Diabetes Mother    Hypertension Mother    Colon cancer Other        uncle, age?   Breast cancer Neg Hx    Cancer Neg Hx    Heart disease Neg Hx    Stroke Neg Hx    Social History   Socioeconomic History   Marital status: Widowed    Spouse name: Not on file   Number of children: 3   Years of education: 49   Highest education level: Not on file  Occupational History   Occupation: retired  Tobacco Use   Smoking status: Former    Packs/day: 0.75    Years: 58.00     Additional pack years: 0.00    Total pack years: 43.50    Types: Cigarettes    Start date: 04/29/2012    Quit date: 10/30/2020    Years since quitting: 1.9   Smokeless tobacco: Never   Tobacco comments:    1/2 ppd  Substance and Sexual Activity   Alcohol use: No    Alcohol/week: 0.0 standard drinks of alcohol   Drug use: No   Sexual activity: Not on file  Other Topics Concern   Not on file  Social History Narrative   Lost son (to drugs) 2008     Husband ill, she takes care of him, raising her g-daughter    Born and raised in [place], [place]. Currently resides in a house with her husband and greatgrandaughter. Maltese dog. Fun: play games with family, gardening, gym   Denies religious beliefs that would effect health care.             Social Determinants of Health   Financial Resource Strain: Low Risk  (10/23/2022)   Overall Financial Resource Strain (CARDIA)    Difficulty of Paying Living Expenses: Not hard at all  Food Insecurity: No Food Insecurity (10/23/2022)   Hunger Vital Sign    Worried About Running Out of Food in the Last Year: Never true    Ran Out of Food in the Last Year: Never true  Transportation Needs: No Transportation Needs (10/23/2022)   PRAPARE - Administrator, Civil Service (Medical): No    Lack of Transportation (Non-Medical): No  Physical Activity: Insufficiently Active (10/23/2022)   Exercise Vital Sign    Days of Exercise per Week: 2 days    Minutes of Exercise per Session: 60 min  Stress: No Stress Concern Present (10/23/2022)   [employer] of Occupational Health - Occupational Stress Questionnaire    Feeling of Stress : Not at all  Social Connections: Moderately Integrated (10/23/2022)   Social Connection and Isolation Panel [NHANES]    Frequency of Communication with Friends and Family: More than three times a week    Frequency of Social Gatherings with Friends and Family: More than three times a week    Attends Religious Services: More  than 4 times per year    Active Member of [employer] or Organizations: Yes    Attends Banker Meetings: More than 4 times per year    Marital Status: Widowed    Tobacco Counseling Counseling given: Not Answered Tobacco comments: 1/2 ppd   Clinical Intake:  Pre-visit preparation completed: Yes  Pain : No/denies pain Pain Score: 0-No pain     BMI - recorded: 27.1 Nutritional Status: BMI 25 -29 Overweight Nutritional Risks: None  Diabetes: No  How often do you need to have someone help you when you read instructions, pamphlets, or other written materials from your doctor or pharmacy?: 1 - Never What is the last grade level you completed in school?: GED  Interpreter Needed?: No  Information entered by :: Neco Kling N. Krishna Dancel, LPN.   Activities of Daily Living    10/23/2022    1:39 PM  In your present state of health, do you have any difficulty performing the following activities:  Hearing? 0  Vision? 0  Difficulty concentrating or making decisions? 0  Walking or climbing stairs? 0  Dressing or bathing? 0  Doing errands, shopping? 0  Preparing Food and eating ? N  Using the Toilet? N  In the past six months, have you accidently leaked urine? N  Do you have problems with loss of bowel control? N  Managing your Medications? N  Managing your Finances? N  Housekeeping or managing your Housekeeping? N    Patient Care Team: Corwin Levins, MD as PCP - General (Internal Medicine) Meriam Sprague, MD as PCP - Cardiology (Cardiology) Louis Meckel, MD (Inactive) as Consulting Physician (Gastroenterology) Kennon Rounds as Physician Assistant (Cardiology) Mia Creek, MD as Consulting Physician (Ophthalmology)  Indicate any recent Medical Services you may have received from other than [facility] providers in the past year (date may be approximate).     Assessment:   This is a routine wellness examination for Mechel.  Hearing/Vision screen Hearing  Screening - Comments:: Denies hearing difficulties.   Vision Screening - Comments:: Wear reading glasses - up to date with routine eye exams with Mia Creek, MD.    Dietary issues and exercise activities discussed:     Goals Addressed             This Visit's Progress    My healthcare goal for 2024 is to maintain my current health status by continuing to eat healthy, stay independent, physically and socially active.        Depression Screen    10/23/2022    1:36 PM 06/13/2022   10:46 AM 02/11/2022   11:33 AM 12/05/2021    1:08 PM 06/20/2021   11:35 AM 06/07/2020   10:41 AM 06/07/2020   10:40 AM  PHQ 2/9 Scores  PHQ - 2 Score 0 0 2 2 0 0 0  PHQ- 9 Score 3 0 11 10  0     Fall Risk    10/23/2022    1:35 PM 02/11/2022   11:33 AM 12/05/2021    1:08 PM 06/20/2021   11:25 AM 06/07/2020   10:40 AM  Fall Risk   Falls in the past year? 0 0 0 0 0  Number falls in past yr: 0 0 0 0 0  Injury with Fall? 0 0 0 0 0  Risk for fall due to : No Fall Risks  No Fall Risks No Fall Risks   Follow up Falls prevention discussed Falls evaluation completed Falls evaluation completed Falls evaluation completed     MEDICARE RISK AT HOME:  Medicare Risk at Home - 10/23/22 1335     Any stairs in or around the home? No    If so, are there any without handrails? No    Home free of loose throw rugs in walkways, pet beds, electrical cords, etc? Yes    Adequate lighting in your home to reduce risk of falls? Yes    Life alert? No    Use  of a cane, walker or w/c? No    Grab bars in the bathroom? Yes    Shower chair or bench in shower? No    Elevated toilet seat or a handicapped toilet? No             TIMED UP AND GO:  Was the test performed?  No    Cognitive Function:        10/23/2022    1:44 PM  6CIT Screen  What Year? 0 points  What month? 0 points  What time? 0 points  Count back from 20 0 points  Months in reverse 0 points  Repeat phrase 0 points  Total Score 0 points     Immunizations Immunization History  Administered Date(s) Administered   Influenza Split 02/13/2011, 01/21/2012, 02/01/2014   Influenza Whole 02/10/2007, 02/17/2008, 02/17/2009, 01/17/2010   Influenza, High Dose Seasonal PF 01/17/2013, 01/15/2022   Influenza,inj,quad, With Preservative 02/10/2019   Influenza-Unspecified 02/06/2015, 01/28/2016, 02/12/2017, 12/30/2017, 02/08/2020   PFIZER Comirnaty(Gray Top)Covid-19 Tri-Sucrose Vaccine 06/12/2019, 07/07/2019, 04/25/2020   Pneumococcal Conjugate-13 04/12/2014, 02/12/2017   Pneumococcal Polysaccharide-23 02/17/2008, 02/01/2014, 12/30/2017   Pneumococcal-Unspecified 12/30/2017   Td 02/10/2007, 07/30/2019   Td (Adult), 2 Lf Tetanus Toxid, Preservative Free 07/30/2019   Tdap 07/09/2018, 07/09/2018   Zoster Recombinat (Shingrix) 11/27/2018    TDAP status: Up to date  Flu Vaccine status: Up to date  Pneumococcal vaccine status: Up to date  Covid-19 vaccine status: Completed vaccines  Qualifies for Shingles Vaccine? Yes   Zostavax completed No   Shingrix Completed?: No.    Education has been provided regarding the importance of this vaccine. Patient has been advised to call insurance company to determine out of pocket expense if they have not yet received this vaccine. Advised may also receive vaccine at local pharmacy or Health Dept. Verbalized acceptance and understanding.  Screening Tests Health Maintenance  Topic Date Due   Zoster Vaccines- Shingrix (2 of 2) 01/22/2019   INFLUENZA VACCINE  11/28/2022   Medicare Annual Wellness (AWV)  10/23/2023   DTaP/Tdap/Td (6 - Td or Tdap) 07/29/2029   Pneumonia Vaccine 48+ Years old  Completed   DEXA SCAN  Completed   HPV VACCINES  Aged Out   COVID-19 Vaccine  Discontinued    Health Maintenance  Health Maintenance Due  Topic Date Due   Zoster Vaccines- Shingrix (2 of 2) 01/22/2019    Colorectal cancer screening: No longer required.   Mammogram status: Completed 02/05/2022.  Repeat every year  Bone Density status: Completed 01/22/2016. Results reflect: Bone density results: OSTEOPENIA. Repeat every 2-3 years.  Lung Cancer Screening: (Low Dose CT Chest recommended if Age 80-80 years, 20 pack-year currently smoking OR have quit w/in 15years.) does not qualify.   Lung Cancer Screening Referral: no  Additional Screening:  Hepatitis C Screening: does not qualify; Completed: no   Vision Screening: Recommended annual ophthalmology exams for early detection of glaucoma and other disorders of the eye. Is the patient up to date with their annual eye exam?  Yes  Who is the provider or what is the name of the office in which the patient attends annual eye exams? Mia Creek, MD. If pt is not established with a provider, would they like to be referred to a provider to establish care? No .   Dental Screening: Recommended annual dental exams for proper oral hygiene  Diabetic Foot Exam: N/A  Community Resource Referral / Chronic Care Management: CRR required this visit?  No   CCM required this  visit?  No     Plan:     I have personally reviewed and noted the following in the patient's chart:   Medical and social history Use of alcohol, tobacco or illicit drugs  Current medications and supplements including opioid prescriptions. Patient is not currently taking opioid prescriptions. Functional ability and status Nutritional status Physical activity Advanced directives List of other physicians Hospitalizations, surgeries, and ER visits in previous 12 months Vitals Screenings to include cognitive, depression, and falls Referrals and appointments  In addition, I have reviewed and discussed with patient certain preventive protocols, quality metrics, and best practice recommendations. A written personalized care plan for preventive services as well as general preventive health recommendations were provided to patient.     Mickeal Needy, LPN   01/11/7828    After Visit Summary: (Mail) Due to this being a telephonic visit, the after visit summary with patients personalized plan was offered to patient via mail   Nurse Notes: Normal cognitive status assessed by direct observation via telephone conversation by this Nurse Health Advisor. No abnormalities found.

## 2022-11-06 ENCOUNTER — Telehealth: Payer: Self-pay | Admitting: Internal Medicine

## 2022-11-06 ENCOUNTER — Other Ambulatory Visit: Payer: Self-pay | Admitting: Internal Medicine

## 2022-11-06 NOTE — Telephone Encounter (Signed)
Refill too soon

## 2022-11-06 NOTE — Telephone Encounter (Signed)
Prescription Request  11/06/2022  LOV: 06/13/2022  What is the name of the medication or equipment? levothyroxine (SYNTHROID) 75 MCG tablet   Have you contacted your pharmacy to request a refill? No   Which pharmacy would you like this sent to?    [id number] [place], [place] - [address] [place] [address] Phone: [phone] Fax: [phone]  Patient notified that their request is being sent to the clinical staff for review and that they should receive a response within 2 business days.   Please advise at Mobile [phone] (mobile)

## 2022-11-11 ENCOUNTER — Other Ambulatory Visit: Payer: Self-pay | Admitting: Internal Medicine

## 2022-11-19 DIAGNOSIS — Z20822 Contact with and (suspected) exposure to covid-19: Secondary | ICD-10-CM | POA: Diagnosis not present

## 2022-11-19 DIAGNOSIS — R0981 Nasal congestion: Secondary | ICD-10-CM | POA: Diagnosis not present

## 2022-11-19 DIAGNOSIS — J029 Acute pharyngitis, unspecified: Secondary | ICD-10-CM | POA: Diagnosis not present

## 2022-11-19 DIAGNOSIS — R059 Cough, unspecified: Secondary | ICD-10-CM | POA: Diagnosis not present

## 2022-11-28 ENCOUNTER — Other Ambulatory Visit: Payer: Self-pay | Admitting: Internal Medicine

## 2022-12-11 ENCOUNTER — Ambulatory Visit
Admission: EM | Admit: 2022-12-11 | Discharge: 2022-12-11 | Disposition: A | Discharge status: Home / Self Care | Payer: Medicare Other | Attending: Family Medicine | Admitting: Family Medicine

## 2022-12-11 DIAGNOSIS — R21 Rash and other nonspecific skin eruption: Secondary | ICD-10-CM | POA: Diagnosis not present

## 2022-12-11 MED ORDER — PREDNISONE 20 MG PO TABS: 20.0000 mg | ORAL_TABLET | Freq: Every day | ORAL | 0 refills | Status: AC

## 2022-12-11 MED ORDER — DOXYCYCLINE HYCLATE 100 MG PO CAPS: 100.0000 mg | ORAL_CAPSULE | Freq: Two times a day (BID) | ORAL | 0 refills | Status: AC

## 2022-12-11 MED ORDER — DEXAMETHASONE SODIUM PHOSPHATE 10 MG/ML IJ SOLN
10.0000 mg | Freq: Once | INTRAMUSCULAR | Status: AC
  Administered 2022-12-11: 10 mg via INTRAMUSCULAR

## 2022-12-11 NOTE — ED Provider Notes (Signed)
[facility]    CSN: [id number] Arrival date & time: 12/11/22  1610      History   Chief Complaint Chief Complaint  Patient presents with   Rash    HPI Meagan Holmes is a 82 y.o. female.   HPI Patient presents today with a 2 week history of a worsening skin rash that has now progressed to involve the face which only initially present on the arms. Endorses the rash is itchy. Unaware if she has had any direct contact with plant irritant. She has used over the counter medication without relief of itching. No history of atopic dermatitis. Rash is present on eyelids but denies any itching inside of the eyes. Past Medical History:  Diagnosis Date   Allergic rhinitis    a lot better after septoplasty- remote   Allergic rhinitis 02/10/2007   Qualifier: Diagnosis of  By: Drue Novel MD, Nolon Rod.    Anxiety and depression 02/10/2007   Carotid artery disease (HCC) 05/21/2014   Carotid US 2/23: Bilat ICA 1-39   COPD (chronic obstructive pulmonary disease) (HCC) 10/02/2010   Family history of early CAD 09/02/2017   Heart murmur 07/06/2014   Hematuria 2004   (-) CT abd-pelvis (-) cystoscopy   Hyperlipidemia 02/17/2009   Qualifier: Diagnosis of  By: Drue Novel MD, Nolon Rod.    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   Tricuspid regurgitation 05/31/2021   Calcification of coronary artery 05/10/2021   Shakiness 12/10/2020    Gallstone 06/07/2020   Aortic atherosclerosis (HCC) 06/07/2020   Cough 07/21/2019   Pre-diabetes 12/17/2017   Family history of early CAD 09/02/2017   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   Medicare annual wellness visit, subsequent 08/08/2016   Osteoarthritis 02/22/2016   Triceps tendonitis 12/14/2015   Heart murmur 07/06/2014   Carotid artery disease (HCC) 05/21/2014   COPD (chronic obstructive pulmonary disease) (HCC) 10/02/2010   Osteoporosis 10/02/2010   Encounter for well adult exam with abnormal findings 10/02/2010   Hyperlipidemia 02/17/2009   Hypothyroidism 02/10/2007   Anxiety and depression 02/10/2007   Allergic rhinitis 02/10/2007   HEMATURIA, MICROSCOPIC, HX OF 02/10/2007    Past Surgical History:  Procedure Laterality Date   APPENDECTOMY     CARPAL TUNNEL RELEASE     NASAL SEPTUM SURGERY     THYROIDECTOMY      OB History   No obstetric history on file.      Home Medications    Prior to Admission medications   Medication Sig Start Date End Date Taking? Authorizing Provider  doxycycline (VIBRAMYCIN) 100 MG capsule Take 1 capsule (100 mg total) by mouth 2 (two) times daily for 7 days. 12/11/22 12/18/22 Yes Bing Neighbors, NP  predniSONE (DELTASONE) 20 MG tablet Take 1 tablet (20 mg total) by mouth daily with breakfast for 5 days. 12/12/22 12/17/22 Yes Bing Neighbors, NP  atorvastatin (LIPITOR) 80  MG tablet Take 1 tablet (80 mg total) by mouth daily. 07/29/22   Meriam Sprague, MD  calcium gluconate 500 MG tablet Take 500 mg by mouth daily.    [provider]  Cholecalciferol (VITAMIN D3 PO) Take 2,000 Units by mouth daily.     [provider]  citalopram (CELEXA) 10 MG tablet TAKE 1 TABLET BY MOUTH EVERY DAY 11/28/22   Corwin Levins, MD  Cyanocobalamin (B-12) 500 MCG TABS     [provider]  dicyclomine (BENTYL) 10 MG capsule TAKE 1 CAPSULE (10 MG TOTAL) BY MOUTH 3 (THREE) TIMES DAILY AS NEEDED FOR  SPASMS. 07/08/22   Corwin Levins, MD  levocetirizine (XYZAL) 5 MG tablet TAKE 1 TABLET BY MOUTH EVERY DAY IN THE EVENING 06/17/22   Corwin Levins, MD  levothyroxine (SYNTHROID) 75 MCG tablet TAKE 1 TABLET BY MOUTH EVERY DAY 11/11/22   Corwin Levins, MD  LORazepam (ATIVAN) 0.5 MG tablet TAKE 1 TABLET BY MOUTH TWICE A DAY AS NEEDED FOR ANXIETY 04/04/21   Corwin Levins, MD    Family History Family History  Problem Relation Age of Onset   Heart attack Father    Diabetes Mother    Hypertension Mother    Colon cancer Other        uncle, age?   Breast cancer Neg Hx    Cancer Neg Hx    Heart disease Neg Hx    Stroke Neg Hx     Social History Social History   Tobacco Use   Smoking status: Former    Current packs/day: 0.00    Average packs/day: 0.8 packs/day for 58.0 years (43.5 ttl pk-yrs)    Types: Cigarettes    Start date: 04/29/2012    Quit date: 10/30/2020    Years since quitting: 2.1   Smokeless tobacco: Never   Tobacco comments:    1/2 ppd  Vaping Use   Vaping status: Never Used  Substance Use Topics   Alcohol use: No    Alcohol/week: 0.0 standard drinks of alcohol   Drug use: No     Allergies   Amoxil [amoxicillin]  Review of Systems Review of Systems Pertinent negatives listed in HPI  Physical Exam Triage Vital Signs ED Triage Vitals  Encounter Vitals Group     BP 12/11/22 1623 115/71     Systolic BP Percentile --      Diastolic BP Percentile --      Pulse Rate 12/11/22 1623 76     Resp 12/11/22 1623 18     Temp 12/11/22 1623 98 F (36.7 C)     Temp Source 12/11/22 1623 Oral     SpO2 12/11/22 1623 95 %     Weight --      Height --      Head Circumference --      Peak Flow --      Pain Score 12/11/22 1626 0     Pain Loc --      Pain Education --      Exclude from Growth Chart --    No data found.  Updated Vital Signs BP 115/71 (BP Location: Left Arm)   Pulse 76   Temp 98 F (36.7 C) (Oral)   Resp 18   SpO2 95%   Visual Acuity Right Eye  Distance:   Left Eye Distance:   Bilateral Distance:    Right Eye Near:   Left Eye Near:    Bilateral Near:  Physical Exam Vitals reviewed.  Constitutional:      Appearance: Normal appearance.  HENT:     Head: Normocephalic.   Cardiovascular:     Rate and Rhythm: Normal rate and regular rhythm.  Pulmonary:     Effort: Pulmonary effort is normal.     Breath sounds: Normal breath sounds.  Musculoskeletal:     Cervical back: Normal range of motion and neck supple.  Lymphadenopathy:     Cervical: No cervical adenopathy.  Skin:    General: Skin is warm.     Findings: Erythema and rash present.  Neurological:     General: No focal deficit present.     Mental Status: She is alert.      UC Treatments / Results  Labs (all labs ordered are listed, but only abnormal results are displayed) Labs Reviewed - No data to display  EKG   Radiology No results found.  Procedures Procedures (including critical care time)  Medications Ordered in UC Medications  dexamethasone (DECADRON) injection 10 mg (10 mg Intramuscular Given 12/11/22 1703)    Initial Impression / Assessment and Plan / UC Course  I have reviewed the triage vital signs and the nursing notes.  Pertinent labs & imaging results that were available during my care of the patient were reviewed by me and considered in my medical decision making (see chart for details).    Facial and neck rash, unable to rule out secondary facial cellulitis therefore will cover empirically to treat for secondary skin infections. Decadron 10 mg IM given here in clinic today. Continue home treatment of rash per discharge medications orders. Strict return precautions given.   Final Clinical Impressions(s) / UC    Final diagnoses:  Facial rash  Rash of neck     Discharge Instructions      If your symptoms worsen or do not rapidly improve with medication prescribed return for evaluation.     ED Prescriptions      Medication Sig Dispense Auth. Provider   doxycycline (VIBRAMYCIN) 100 MG capsule Take 1 capsule (100 mg total) by mouth 2 (two) times daily for 7 days. 14 capsule Bing Neighbors, NP   predniSONE (DELTASONE) 20 MG tablet Take 1 tablet (20 mg total) by mouth daily with breakfast for 5 days. 5 tablet Bing Neighbors, NP      PDMP not reviewed this encounter.   Bing Neighbors, NP 12/16/22 [phone]

## 2022-12-11 NOTE — ED Triage Notes (Signed)
Pt reports itchy rash started in neck x 2  weeks, spread to face yesterday. Hydrocortisone cream gives relief. Pt think rash can be from the pool or a bush she cut couple weeks ago.

## 2022-12-11 NOTE — Discharge Instructions (Signed)
If your symptoms worsen or do not rapidly improve with medication prescribed return for evaluation.

## 2022-12-26 DIAGNOSIS — L309 Dermatitis, unspecified: Secondary | ICD-10-CM | POA: Diagnosis not present

## 2022-12-26 DIAGNOSIS — D2372 Other benign neoplasm of skin of left lower limb, including hip: Secondary | ICD-10-CM | POA: Diagnosis not present

## 2022-12-26 DIAGNOSIS — L821 Other seborrheic keratosis: Secondary | ICD-10-CM | POA: Diagnosis not present

## 2022-12-26 DIAGNOSIS — D225 Melanocytic nevi of trunk: Secondary | ICD-10-CM | POA: Diagnosis not present

## 2023-01-29 ENCOUNTER — Telehealth: Payer: Self-pay | Admitting: Internal Medicine

## 2023-01-29 NOTE — Telephone Encounter (Signed)
Not due for refill , another refill is already at the pharmacy.

## 2023-01-29 NOTE — Telephone Encounter (Signed)
Prescription Request  01/29/2023  LOV: 06/13/2022  What is the name of the medication or equipment? Synthroid - does not want the generic  Have you contacted your pharmacy to request a refill? Yes   Which pharmacy would you like this sent to?  [id number] [place], [place] - [address] [place] [address] Phone: [phone] Fax: [phone]  Patient notified that their request is being sent to the clinical staff for review and that they should receive a response within 2 business days.   Please advise at Mobile [phone] (mobile)

## 2023-02-10 MED ORDER — SYNTHROID 75 MCG PO TABS: 75.0000 ug | ORAL_TABLET | Freq: Every day | ORAL | 0 refills | Status: DC

## 2023-02-10 NOTE — Telephone Encounter (Signed)
Called and left voice mail

## 2023-02-10 NOTE — Telephone Encounter (Signed)
Ok done to [facility] , but remember it will be more expensive depending on your insurance

## 2023-02-10 NOTE — Telephone Encounter (Signed)
Pt called back and stated she is okay with the cost.

## 2023-02-10 NOTE — Telephone Encounter (Signed)
Patient would like to change from the generic to the name brand Synthroid. She does not want to take the generic anymore. She said she has one pill left and needs her medication filled ASAP. Best callback is [phone].

## 2023-02-11 DIAGNOSIS — Z1231 Encounter for screening mammogram for malignant neoplasm of breast: Secondary | ICD-10-CM | POA: Diagnosis not present

## 2023-02-11 LAB — HM MAMMOGRAPHY

## 2023-02-12 ENCOUNTER — Encounter: Payer: Self-pay | Admitting: Internal Medicine

## 2023-02-20 ENCOUNTER — Encounter: Payer: Self-pay | Admitting: Internal Medicine

## 2023-02-20 ENCOUNTER — Ambulatory Visit (INDEPENDENT_AMBULATORY_CARE_PROVIDER_SITE_OTHER): Payer: Medicare Other | Admitting: Internal Medicine

## 2023-02-20 VITALS — BP 124/78 | HR 65 | Temp 97.9°F | Ht 63.0 in | Wt 152.0 lb

## 2023-02-20 DIAGNOSIS — R7303 Prediabetes: Secondary | ICD-10-CM | POA: Diagnosis not present

## 2023-02-20 DIAGNOSIS — F32A Depression, unspecified: Secondary | ICD-10-CM

## 2023-02-20 DIAGNOSIS — J309 Allergic rhinitis, unspecified: Secondary | ICD-10-CM

## 2023-02-20 DIAGNOSIS — E782 Mixed hyperlipidemia: Secondary | ICD-10-CM | POA: Diagnosis not present

## 2023-02-20 DIAGNOSIS — R109 Unspecified abdominal pain: Secondary | ICD-10-CM | POA: Insufficient documentation

## 2023-02-20 DIAGNOSIS — E039 Hypothyroidism, unspecified: Secondary | ICD-10-CM | POA: Diagnosis not present

## 2023-02-20 DIAGNOSIS — F419 Anxiety disorder, unspecified: Secondary | ICD-10-CM

## 2023-02-20 DIAGNOSIS — R1011 Right upper quadrant pain: Secondary | ICD-10-CM

## 2023-02-20 LAB — CBC WITH DIFFERENTIAL/PLATELET
Basophils Absolute: 0 10*3/uL (ref 0.0–0.1)
Basophils Relative: 0.8 % (ref 0.0–3.0)
Eosinophils Absolute: 0.2 10*3/uL (ref 0.0–0.7)
Eosinophils Relative: 3.4 % (ref 0.0–5.0)
HCT: 42.1 % (ref 36.0–46.0)
Hemoglobin: 13.7 g/dL (ref 12.0–15.0)
Lymphocytes Relative: 25.6 % (ref 12.0–46.0)
Lymphs Abs: 1.4 10*3/uL (ref 0.7–4.0)
MCHC: 32.5 g/dL (ref 30.0–36.0)
MCV: 92 fL (ref 78.0–100.0)
Monocytes Absolute: 0.5 10*3/uL (ref 0.1–1.0)
Monocytes Relative: 9.3 % (ref 3.0–12.0)
Neutro Abs: 3.2 10*3/uL (ref 1.4–7.7)
Neutrophils Relative %: 60.9 % (ref 43.0–77.0)
Platelets: 226 10*3/uL (ref 150.0–400.0)
RBC: 4.57 Mil/uL (ref 3.87–5.11)
RDW: 13.8 % (ref 11.5–15.5)
WBC: 5.3 10*3/uL (ref 4.0–10.5)

## 2023-02-20 LAB — BASIC METABOLIC PANEL
BUN: 13 mg/dL (ref 6–23)
CO2: 31 meq/L (ref 19–32)
Calcium: 10 mg/dL (ref 8.4–10.5)
Chloride: 105 meq/L (ref 96–112)
Creatinine, Ser: 0.79 mg/dL (ref 0.40–1.20)
GFR: 69.54 mL/min (ref >60.00)
Glucose, Bld: 110 mg/dL — ABNORMAL HIGH (ref 70–99)
Potassium: 5.2 meq/L — ABNORMAL HIGH (ref 3.5–5.1)
Sodium: 142 meq/L (ref 135–145)

## 2023-02-20 LAB — HEPATIC FUNCTION PANEL
ALT: 30 U/L (ref 0–35)
AST: 29 U/L (ref 0–37)
Albumin: 4.4 g/dL (ref 3.5–5.2)
Alkaline Phosphatase: 40 U/L (ref 39–117)
Bilirubin, Direct: 0.1 mg/dL (ref 0.0–0.3)
Total Bilirubin: 0.6 mg/dL (ref 0.2–1.2)
Total Protein: 7.5 g/dL (ref 6.0–8.3)

## 2023-02-20 LAB — URINALYSIS, ROUTINE W REFLEX MICROSCOPIC
Bilirubin Urine: NEGATIVE
Hgb urine dipstick: NEGATIVE
Ketones, ur: NEGATIVE
Nitrite: NEGATIVE
Specific Gravity, Urine: 1.015 (ref 1.000–1.030)
Total Protein, Urine: NEGATIVE
Urine Glucose: NEGATIVE
Urobilinogen, UA: 0.2 (ref 0.0–1.0)
pH: 6 (ref 5.0–8.0)

## 2023-02-20 LAB — LIPASE: Lipase: 25 U/L (ref 11.0–59.0)

## 2023-02-20 LAB — HEMOGLOBIN A1C: Hgb A1c MFr Bld: 6 % (ref 4.6–6.5)

## 2023-02-20 MED ORDER — TRAMADOL HCL 50 MG PO TABS
50.0000 mg | ORAL_TABLET | Freq: Four times a day (QID) | ORAL | 0 refills | Status: AC | PRN
Start: 2023-02-20 — End: ?

## 2023-02-20 MED ORDER — LORAZEPAM 0.5 MG PO TABS
ORAL_TABLET | ORAL | 1 refills | Status: DC
Start: 2023-02-20 — End: 2024-03-16

## 2023-02-20 NOTE — Progress Notes (Signed)
Patient ID: Meagan Holmes, female   DOB: 11-08-1940, 82 y.o.   MRN: [id number]        Chief Complaint: follow up right flank pain, anxiety, preDM, hld, low thyroid, copd       HPI:  Meagan Holmes is a 82 y.o. female here with c/o 1 wk right flank pain,mod to severe, intermittent, less in last 2 days, Denies urinary symptoms such as dysuria, frequency, urgency, hematuria or n/v, fever, chills.  Deep breaths and moving about does not make worse.  Pt denies chest pain, increased sob or doe, wheezing, orthopnea, PND, increased LE swelling, palpitations, dizziness or syncope.   Pt denies polydipsia, polyuria, or new focal neuro s/s.    Pt denies fever, wt loss, night sweats, loss of appetite, or other constitutional symptoms   Quit smoking October 30 2020. Denies worsening depressive symptoms, suicidal ideation, or panic; has ongoing anxiety,  Does have several wks ongoing nasal allergy symptoms with clearish congestion, itch and sneezing, without fever, pain, ST, cough, swelling or wheezing. Wt Readings from Last 3 Encounters:  02/20/23 152 lb (68.9 kg)  10/23/22 153 lb (69.4 kg)  08/28/22 154 lb (69.9 kg)   BP Readings from Last 3 Encounters:  02/20/23 124/78  12/11/22 115/71  08/28/22 118/70         Past Medical History:  Diagnosis Date   Allergic rhinitis    a lot better after septoplasty- remote   Allergic rhinitis 02/10/2007   Qualifier: Diagnosis of  By: Drue Novel MD, Nolon Rod.    Anxiety and depression 02/10/2007   Carotid artery disease (HCC) 05/21/2014   Carotid US 2/23: Bilat ICA 1-39   COPD (chronic obstructive pulmonary disease) (HCC) 10/02/2010   Family history of early CAD 09/02/2017   Heart murmur 07/06/2014   Hematuria 2004   (-) CT abd-pelvis (-) cystoscopy   Hyperlipidemia 02/17/2009   Qualifier: Diagnosis of  By: Drue Novel MD, Nolon Rod.    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 Her smoking use included cigarettes. She started smoking about 10 years ago. She has a 43.5 pack-year smoking history. She has never used smokeless tobacco. She reports that she does not drink alcohol and does not use drugs. family history includes Colon cancer in an other family member; Diabetes in her mother; Heart attack in her father; Hypertension in her mother. Allergies  Allergen Reactions   Amoxil [Amoxicillin] Hives   Current Outpatient Medications on File Prior to Visit  Medication Sig Dispense Refill   atorvastatin (LIPITOR) 80 MG tablet Take 1 tablet (80 mg total) by mouth daily. 90 tablet 3   calcium gluconate 500 MG tablet Take 500 mg by mouth daily.     Cholecalciferol (VITAMIN D3 PO) Take 2,000 Units by mouth daily.      Cyanocobalamin (B-12) 500 MCG TABS  dicyclomine (BENTYL) 10 MG capsule TAKE 1 CAPSULE (10 MG TOTAL) BY MOUTH 3 (THREE) TIMES DAILY AS NEEDED FOR SPASMS. 270 capsule 1   levocetirizine (XYZAL) 5 MG tablet TAKE 1 TABLET BY MOUTH EVERY DAY IN THE EVENING 90 tablet 3   SYNTHROID 75 MCG tablet Take 1 tablet (75 mcg total) by mouth daily before breakfast. 90 tablet 0   citalopram (CELEXA) 10 MG tablet TAKE 1 TABLET BY MOUTH EVERY DAY (Patient not taking: Reported on 02/20/2023) 90 tablet 1   No current facility-administered medications on file prior to visit.        ROS:  All others reviewed and negative.  Objective        PE:  BP 124/78 (BP Location: Right Arm, Patient Position:  Sitting, Cuff Size: Normal)   Pulse 65   Temp 97.9 F (36.6 C) (Oral)   Ht 5\' 3"  (1.6 m)   Wt 152 lb (68.9 kg)   SpO2 99%   BMI 26.93 kg/m                 Constitutional: Pt appears in NAD               HENT: Head: NCAT.                Right Ear: External ear normal.                 Left Ear: External ear normal.                Eyes: . Pupils are equal, round, and reactive to light. Conjunctivae and EOM are normal               Nose: without d/c or deformity               Neck: Neck supple. Gross normal ROM               Cardiovascular: Normal rate and regular rhythm.                 Pulmonary/Chest: Effort normal and breath sounds without rales or wheezing.                Abd:  Soft, NT, ND, + BS, no organomegaly               Neurological: Pt is alert. At baseline orientation, motor grossly intact               Skin: Skin is warm. No rashes, no other new lesions, LE edema - none               Psychiatric: Pt behavior is normal without agitation   Micro: none  Cardiac tracings I have personally interpreted today:  none  Pertinent Radiological findings (summarize): none   Lab Results  Component Value Date   WBC 5.3 02/20/2023   HGB 13.7 02/20/2023   HCT 42.1 02/20/2023   PLT 226.0 02/20/2023   GLUCOSE 110 (H) 02/20/2023   CHOL 123 06/13/2022   TRIG 65.0 06/13/2022   HDL 43.80 06/13/2022   LDLDIRECT 171.2 03/19/2012   LDLCALC 67 06/13/2022   ALT 30 02/20/2023   AST 29 02/20/2023   NA 142 02/20/2023   K 5.2 No hemolysis seen (H) 02/20/2023   CL 105 02/20/2023   CREATININE 0.79 02/20/2023   BUN 13 02/20/2023   CO2 31 02/20/2023   TSH 2.11 09/06/2022   HGBA1C 6.0 02/20/2023   MICROALBUR <0.7 06/13/2022  Assessment/Plan:  Meagan Holmes is a 82 y.o. White or Caucasian [1] female with  has a past medical history of Allergic rhinitis, Allergic rhinitis (02/10/2007), Anxiety and depression (02/10/2007), Carotid artery disease (HCC) (05/21/2014), COPD (chronic  obstructive pulmonary disease) (HCC) (10/02/2010), Family history of early CAD (09/02/2017), Heart murmur (07/06/2014), Hematuria (2004), Hyperlipidemia (02/17/2009), Hypothyroidism, Nicotine dependence with nicotine-induced disorder (06/19/2017), Osteopenia, Osteoporosis (10/02/2010), Pre-diabetes (12/17/2017), Triceps tendonitis (12/14/2015), and Tricuspid regurgitation (05/31/2021).  Right flank pain Etiology unclear, for ct renal, lab including cbc and ua, tramadol prn,  to f/u any worsening symptoms or concerns  Pre-diabetes Lab Results  Component Value Date   HGBA1C 6.0 02/20/2023   Stable, pt to continue current medical treatment  - diet, wt control   Hypothyroidism Lab Results  Component Value Date   TSH 2.11 09/06/2022   Stable, pt to continue levothyroxine 75 mcg qd   Hyperlipidemia Lab Results  Component Value Date   LDLCALC 67 06/13/2022   Stable, pt to continue current statin lipitor 80 qd   Anxiety and depression Stable overall, refill lorazepam prn  Allergic rhinitis Mild to mod, to restart xyzal 5 mg prn,  to f/u any worsening symptoms or concerns  Followup: Return if symptoms worsen or fail to improve.  Oliver Barre, MD 02/23/2023 3:18 PM [facility]

## 2023-02-20 NOTE — Progress Notes (Signed)
The test results show that your current treatment is OK, as the tests are stable.  Please continue the same plan.  There is no other need for change of treatment or further evaluation based on these results, at this time.  thanks 

## 2023-02-20 NOTE — Patient Instructions (Addendum)
Please take all new medication as prescribed - the tramadol as needed for pain  Please continue all other medications as before, and refills have been done if requested.  Please have the pharmacy call with any other refills you may need.  Please keep your appointments with your specialists as you may have planned  You will be contacted regarding the referral for: CT scan at [facility]  Please go to the LAB at the blood drawing area for the tests to be done  You will be contacted by phone if any changes need to be made immediately.  Otherwise, you will receive a letter about your results with an explanation, but please check with MyChart first.

## 2023-02-23 ENCOUNTER — Encounter: Payer: Self-pay | Admitting: Internal Medicine

## 2023-02-23 NOTE — Assessment & Plan Note (Signed)
Etiology unclear, for ct renal, lab including cbc and ua, tramadol prn,  to f/u any worsening symptoms or concerns

## 2023-02-23 NOTE — Assessment & Plan Note (Signed)
Lab Results  Component Value Date   HGBA1C 6.0 02/20/2023   Stable, pt to continue current medical treatment  - diet, wt control

## 2023-02-23 NOTE — Assessment & Plan Note (Signed)
Mild to mod, to restart xyzal 5 mg prn,  to f/u any worsening symptoms or concerns

## 2023-02-23 NOTE — Assessment & Plan Note (Signed)
Stable overall, refill lorazepam prn

## 2023-02-23 NOTE — Assessment & Plan Note (Signed)
Lab Results  Component Value Date   TSH 2.11 09/06/2022   Stable, pt to continue levothyroxine 75 mcg qd

## 2023-02-23 NOTE — Assessment & Plan Note (Signed)
Lab Results  Component Value Date   LDLCALC 67 06/13/2022   Stable, pt to continue current statin lipitor 80 qd

## 2023-02-26 ENCOUNTER — Ambulatory Visit (HOSPITAL_COMMUNITY)
Admission: RE | Admit: 2023-02-26 | Discharge: 2023-02-26 | Disposition: A | Discharge status: Home / Self Care | Payer: Medicare Other | Source: Ambulatory Visit | Attending: Internal Medicine | Admitting: Internal Medicine

## 2023-02-26 DIAGNOSIS — R1011 Right upper quadrant pain: Secondary | ICD-10-CM | POA: Diagnosis not present

## 2023-02-26 DIAGNOSIS — K7689 Other specified diseases of liver: Secondary | ICD-10-CM | POA: Diagnosis not present

## 2023-02-26 DIAGNOSIS — K8020 Calculus of gallbladder without cholecystitis without obstruction: Secondary | ICD-10-CM | POA: Diagnosis not present

## 2023-03-05 ENCOUNTER — Encounter: Payer: Self-pay | Admitting: Internal Medicine

## 2023-03-05 ENCOUNTER — Ambulatory Visit: Payer: Medicare Other | Admitting: Internal Medicine

## 2023-03-05 VITALS — BP 122/78 | HR 74 | Ht 63.0 in | Wt 154.0 lb

## 2023-03-05 DIAGNOSIS — Z8639 Personal history of other endocrine, nutritional and metabolic disease: Secondary | ICD-10-CM

## 2023-03-05 DIAGNOSIS — E890 Postprocedural hypothyroidism: Secondary | ICD-10-CM

## 2023-03-05 LAB — TSH: TSH: 0.16 u[IU]/mL — ABNORMAL LOW (ref 0.35–5.50)

## 2023-03-05 LAB — T4, FREE: Free T4: 1.36 ng/dL (ref 0.60–1.60)

## 2023-03-05 NOTE — Patient Instructions (Signed)

## 2023-03-05 NOTE — Progress Notes (Unsigned)
Name: Meagan Holmes  MRN/ DOB: [id number], 10-13-1940    Age/ Sex: 82 y.o., female    PCP: Corwin Levins, MD   Reason for Endocrinology Evaluation: Luiz Blare' disease     Date of Initial Endocrinology Evaluation: 08/28/2022    HPI: Meagan Holmes is a 82 y.o. female with a past medical history of Hx of Graves' disease, depression. The patient presented for initial endocrinology clinic visit on 08/28/2022 for consultative assistance with her Luiz Blare' disease.   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    Weight stable  Denies local neck swelling  Denies palpitations  Denies tremors  Has occasional alternating bowel movements  Energy stable  She is not on Biotin  Up to date on eye exams  Has noted strain on the eyes    Levothyroxine 75 mcg daily  HISTORY:  Past Medical History:  Past Medical History:  Diagnosis Date   Allergic rhinitis    a lot better after septoplasty- remote   Allergic rhinitis 02/10/2007   Qualifier: Diagnosis of  By: Drue Novel MD, Nolon Rod.    Anxiety and depression 02/10/2007   Carotid artery disease (HCC) 05/21/2014   Carotid US 2/23: Bilat ICA 1-39   COPD (chronic obstructive pulmonary disease) (HCC) 10/02/2010   Family history of early CAD 09/02/2017   Heart murmur 07/06/2014   Hematuria 2004   (-) CT abd-pelvis (-) cystoscopy   Hyperlipidemia 02/17/2009   Qualifier: Diagnosis of  By: Drue Novel MD, Nolon Rod.    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   Osteopenia    Osteoporosis 10/02/2010    DEXA  2008 T score -2.1 DEXA 02-2009 Tscore - 3.3 Started fosamax 2009, switched to later to actonel to increase compliance    Pre-diabetes 12/17/2017   Triceps tendonitis 12/14/2015   Tricuspid regurgitation 05/31/2021    Echo 2/23: EF 60-65, no RWMA, normal RVSF, normal PASP (RVSP 20.4), trivial MR, mild TR   Past Surgical History:  Past Surgical History:  Procedure Laterality Date   APPENDECTOMY     CARPAL TUNNEL RELEASE     NASAL SEPTUM SURGERY     THYROIDECTOMY      Social History:  reports that she quit smoking about 2 years ago. Her smoking use included cigarettes. She started smoking about 10 years ago. She has a 43.5 pack-year smoking history. She has never used smokeless tobacco. She reports that she does not drink alcohol and does not use drugs. Family History: family history includes Colon cancer in an other family member; Diabetes in her mother; Heart attack in her father; Hypertension in her mother.   HOME MEDICATIONS: Allergies as of 03/05/2023       Reactions   Amoxil [amoxicillin] Hives        Medication List        Accurate as of March 05, 2023  7:31 AM. If you have any questions, ask your nurse or doctor.          atorvastatin 80 MG tablet Commonly known as: LIPITOR Take 1 tablet (80 mg total) by mouth daily.   B-12 500 MCG Tabs   calcium gluconate 500 MG tablet Take 500 mg by mouth daily.   citalopram 10 MG tablet Commonly  known as: CELEXA TAKE 1 TABLET BY MOUTH EVERY DAY   dicyclomine 10 MG capsule Commonly known as: BENTYL TAKE 1 CAPSULE (10 MG TOTAL) BY MOUTH 3 (THREE) TIMES DAILY AS NEEDED FOR SPASMS.   levocetirizine 5 MG tablet Commonly known as: XYZAL TAKE 1 TABLET BY MOUTH EVERY DAY IN THE EVENING   LORazepam 0.5 MG tablet Commonly known as: ATIVAN TAKE 1 TABLET BY MOUTH TWICE A DAY AS NEEDED FOR ANXIETY   Synthroid 75 MCG tablet Generic drug: levothyroxine Take 1 tablet (75 mcg total) by mouth daily before breakfast.   traMADol 50 MG tablet Commonly known as: ULTRAM Take 1 tablet (50 mg total) by mouth every 6 (six) hours as needed.   VITAMIN D3 PO Take 2,000 Units by mouth daily.          REVIEW OF SYSTEMS: A comprehensive ROS  was conducted with the patient and is negative except as per HPI   OBJECTIVE:  VS: There were no vitals taken for this visit.   Wt Readings from Last 3 Encounters:  02/20/23 152 lb (68.9 kg)  10/23/22 153 lb (69.4 kg)  08/28/22 154 lb (69.9 kg)     EXAM: General: Pt appears well and is in NAD  Eyes: External eye exam normal without stare, lid lag or exophthalmos.  EOM intact.   Neck: General: Supple without adenopathy. Thyroid: Thyroid size normal.  No goiter or nodules appreciated.  Lungs: Clear with good BS bilat   Heart: Auscultation: RRR.  Extremities:  BL LE: No pretibial edema normal   Mental Status: Judgment, insight: Intact Orientation: Oriented to time, place, and person Mood and affect: No depression, anxiety, or agitation     DATA REVIEWED:     ASSESSMENT/PLAN/RECOMMENDATIONS:   Postoperative hypothyroid:  -She is s/p thyroidectomy secondary to Graves' disease -Patient is complaining of fatigue, that she attributes to decreasing levothyroxine dose from 88 mcg to 75 mcg last summer with a TSH of 0.05u IU/mL, patient is on biotin, and she was advised to hold biotin 2-3 days before any future thyroid checkup. -Patient does endorse being on biotin while her TFTs have been drawn in the past -Patient will stop biotin and return for repeat TFTs   Medications : Continue levothyroxine 75 mcg daily  Follow-up in 6 months  Signed electronically by: Lyndle Herrlich, MD  [facility]  [facility] [address] [place] [place], [place] [address] Phone: [phone] FAX: [phone]   CC: Corwin Levins, MD [address] [place] [address] Phone: [phone] Fax: [phone]   Return to Endocrinology clinic as below: Future Appointments  Date Time Provider Department Center  03/05/2023 11:10 AM Terrika Zuver, Konrad Dolores, MD [facility] None  10/29/2023  3:00 PM [facility]-ANNUAL WELLNESS VISIT [facility] None

## 2023-03-06 ENCOUNTER — Telehealth: Payer: Self-pay | Admitting: Internal Medicine

## 2023-03-06 NOTE — Telephone Encounter (Signed)
Please let the patient know that the current dose of levothyroxine shows that she is on too much of thyroid hormone.   Please let the patient know that too much thyroid hormone will affect her heart as well as makes her more tired and can cause here and stomach issues.  I will suggest she takes levothyroxine 75 mcg Monday through Saturday and NONE on Sundays from now on .   Please schedule her for repeat thyroid test in 2 months   Thanks

## 2023-03-06 NOTE — Telephone Encounter (Signed)
Patient advised and will callback to schedule lab appointment when she is home and can look at her calendar.

## 2023-03-12 ENCOUNTER — Encounter: Payer: Self-pay | Admitting: Gastroenterology

## 2023-04-29 DIAGNOSIS — H18413 Arcus senilis, bilateral: Secondary | ICD-10-CM | POA: Diagnosis not present

## 2023-04-29 DIAGNOSIS — H40013 Open angle with borderline findings, low risk, bilateral: Secondary | ICD-10-CM | POA: Diagnosis not present

## 2023-04-29 DIAGNOSIS — H16223 Keratoconjunctivitis sicca, not specified as Sjogren's, bilateral: Secondary | ICD-10-CM | POA: Diagnosis not present

## 2023-04-29 DIAGNOSIS — Z961 Presence of intraocular lens: Secondary | ICD-10-CM | POA: Diagnosis not present

## 2023-05-07 ENCOUNTER — Other Ambulatory Visit: Payer: Self-pay

## 2023-05-07 ENCOUNTER — Other Ambulatory Visit: Payer: Self-pay | Admitting: Internal Medicine

## 2023-06-17 ENCOUNTER — Other Ambulatory Visit: Payer: Self-pay

## 2023-06-17 ENCOUNTER — Other Ambulatory Visit: Payer: Self-pay | Admitting: Internal Medicine

## 2023-06-17 DIAGNOSIS — J309 Allergic rhinitis, unspecified: Secondary | ICD-10-CM

## 2023-07-30 ENCOUNTER — Other Ambulatory Visit: Payer: Self-pay | Admitting: Internal Medicine

## 2023-07-30 NOTE — Telephone Encounter (Signed)
 Copied from CRM [id number]. Topic: Clinical - Medication Refill >> Jul 30, 2023  4:16 PM Elizebeth Brooking wrote: Most Recent Primary Care Visit:  Provider: Corwin Levins  Department: [facility]  Visit Type: OFFICE VISIT  Date: 02/20/2023  Medication: SYNTHROID 75 MCG tablet  Has the patient contacted their pharmacy? Yes (Agent: If no, request that the patient contact the pharmacy for the refill. If patient does not wish to contact the pharmacy document the reason why and proceed with request.) (Agent: If yes, when and what did the pharmacy advise?)  Is this the correct pharmacy for this prescription? Yes If no, delete pharmacy and type the correct one.  This is the patient's preferred pharmacy:  [id number] [place], [place] - [address] [place] [address] Phone: [phone] Fax: [phone]  [facility] - [place], [place] - [address] [facility] [address] [place] [address] [place] [address] Phone: [phone] Fax: [phone]  [facility] [address], [place] - [address] [place] [address] Phone: [phone] Fax: [phone]   Has the prescription been filled recently? No  Is the patient out of the medication? Yes  Has the patient been seen for an appointment in the last year OR does the patient have an upcoming appointment? Yes  Can we respond through MyChart? Yes  Agent: Please be advised that Rx refills may take up to 3 business days. We ask that you follow-up with your pharmacy.

## 2023-07-31 ENCOUNTER — Other Ambulatory Visit: Payer: Self-pay

## 2023-08-25 ENCOUNTER — Other Ambulatory Visit: Payer: Self-pay

## 2023-08-25 MED ORDER — ATORVASTATIN CALCIUM 80 MG PO TABS: 80.0000 mg | ORAL_TABLET | Freq: Every day | ORAL | 0 refills | Status: DC

## 2023-09-04 NOTE — Progress Notes (Signed)
 Cardiology Office Note    Patient Name: Meagan Holmes Date of Encounter: 09/04/2023  Primary Care Provider:  Roslyn Coombe, MD Primary Cardiologist:  Sonny Dust, MD (Inactive) Primary Electrophysiologist: None   Past Medical History    Past Medical History:  Diagnosis Date   Allergic rhinitis    a lot better after septoplasty- remote   Allergic rhinitis 02/10/2007   Qualifier: Diagnosis of  By: Neomi Banks MD, Anitra Ket.    Anxiety and depression 02/10/2007   Carotid artery disease (HCC) 05/21/2014   Carotid US  2/23: Bilat ICA 1-39   COPD (chronic obstructive pulmonary disease) (HCC) 10/02/2010   Family history of early CAD 09/02/2017   Heart murmur 07/06/2014   Hematuria 2004   (-) CT abd-pelvis (-) cystoscopy   Hyperlipidemia 02/17/2009   Qualifier: Diagnosis of  By: Neomi Banks MD, Anitra Ket.    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   Osteopenia    Osteoporosis 10/02/2010    DEXA  2008 T score -2.1 DEXA 02-2009 Tscore - 3.3 Started fosamax 2009, switched to later to actonel  to increase compliance    Pre-diabetes 12/17/2017   Triceps tendonitis 12/14/2015   Tricuspid regurgitation 05/31/2021   Echo 2/23: EF 60-65, no RWMA, normal RVSF, normal PASP (RVSP 20.4), trivial MR, mild TR    History of Present Illness  Meagan Holmes is a 83 y.o. female with a PMH of coronary calcifications, aortic atherosclerosis, HLD, tobacco abuse, COPD, family history of CAD hypothyroidism, carotid artery disease disease LICA 60 -79% regressed to 1-39%, prediabetes who presents today for follow-up.  Meagan Holmes was seen initially by Meagan Holmes and Meagan Holmes and was most recently followed by Meagan Holmes.  She was initially evaluated for abnormal carotid duplex ultrasound.  She was noted to have systolic heart murmur and TTE completed 06/2014 that was normal with trivial MR.  She was seen by Meagan Holmes on 07/29/2022 to establish care after being followed by  Meagan Holmes.  During her visit she reported doing well and had quit smoking on 10/30/2020.  She was doing yoga twice per week without any anginal symptoms.  She had a repeat carotid Doppler with mild (1-39% bilaterally seen on 05/2021.  She had no significant cardiac complaints and no medication changes were made at that time.  TTE was completed on 05/31/2021 showing EF of 60 to 65% with no RWMA and normal PASP with trivial MVR.  Meagan Holmes presents today for annual follow-up. Since her last visit, she has resumed smoking about a month ago, currently smoking half a pack per day, attributing the relapse to stress from raising her 83 year old adopted daughter. She previously quit using over-the-counter nicotine packs. She experiences increased fatigue and tiredness during activities such as gardening, requiring breaks, which is a change from her previous activity level. No chest pain, dizziness, or palpitations are noted, but she feels 'worn out' rather than shortness of breath.She has a history of COPD but does not currently use any inhalers or see a pulmonologist.  We discussed following up with PCP Dr. Autry Legions about possible referral to pulmonology. She is on atorvastatin , but she took a break from the medication for about a month, resuming the full dose last week. She denies any side effects from the medication. Patient denies chest pain, palpitations, dyspnea, PND, orthopnea, nausea, vomiting, dizziness, syncope, edema, weight gain, or early satiety.  Discussed the use of AI scribe software for clinical note transcription with the patient, who  gave verbal consent to proceed.  History of Present Illness   Review of Systems  Please see the history of present illness.    All other systems reviewed and are otherwise negative except as noted above.  Physical Exam     Wt Readings from Last 3 Encounters:  03/05/23 154 lb (69.9 kg)  02/20/23 152 lb (68.9 kg)  10/23/22 153 lb (69.4 kg)   BJ:YNWGN were no  vitals filed for this visit.,There is no height or weight on file to calculate BMI. GEN: Well nourished, well developed in no acute distress Neck: No JVD; bilateral carotid bruits Pulmonary: Clear to auscultation without rales, wheezing or rhonchi  Cardiovascular: Normal rate. Regular rhythm. Normal S1. Normal S2.   Murmurs: 2/6 systolic murmur ABDOMEN: Soft, non-tender, non-distended EXTREMITIES:  No edema; No deformity   EKG/LABS/ Recent Cardiac Studies   ECG personally reviewed by me today -sinus rhythm with rate of 60 bpm and no acute changes consistent with previous EKG.  Risk Assessment/Calculations:    Lab Results  Component Value Date   WBC 5.3 02/20/2023   HGB 13.7 02/20/2023   HCT 42.1 02/20/2023   MCV 92.0 02/20/2023   PLT 226.0 02/20/2023   Lab Results  Component Value Date   CREATININE 0.79 02/20/2023   BUN 13 02/20/2023   NA 142 02/20/2023   K 5.2 No hemolysis seen (H) 02/20/2023   CL 105 02/20/2023   CO2 31 02/20/2023   Lab Results  Component Value Date   CHOL 123 06/13/2022   HDL 43.80 06/13/2022   LDLCALC 67 06/13/2022   LDLDIRECT 171.2 03/19/2012   TRIG 65.0 06/13/2022   CHOLHDL 3 06/13/2022    Lab Results  Component Value Date   HGBA1C 6.0 02/20/2023   Assessment & Plan    Assessment & Plan   1.  Carotid stenosis: -Carotid stenosis persists with audible bruit despite previous Doppler showing minimal narrowing. -Patient has unfortunately resumed smoking since previous visit. - Order carotid Doppler for surveillance.  2.  Coronary calcifications: - CT of the lungs completed on 11/2419 with multivessel coronary artery atherosclerosis noted as well as aortic atherosclerosis - Cholesterol well-controlled - Continue Lipitor 80 mg daily - Patient will start ASA 81 mg daily  3.  Hyperlipidemia: - Patient's cholesterol numbers are controlled with LDL of 67 - Continue current GDMT with Lipitor 80 mg daily  4.  History of tobacco abuse: Resumed  smoking due to stress, impacting COPD and cardiovascular health. Smoking cessation critical. - Provide 1-800-QUIT-NOW information for smoking cessation support. - Discuss nicotine patches and primary care referral for prescription.  5.  Nonrheumatic MR: - Chronic mitral valve regurgitation with increased fatigue suggests possible worsening. Surveillance required. - Order echocardiogram to evaluate mitral valve function and regurgitation status.  6. Chronic obstructive pulmonary disease (COPD) COPD with increased fatigue. No current pulmonary follow-up. Referral to pulmonologist recommended for specialized management. - Discuss pulmonologist referral for COPD management.  Disposition: Follow-up with Dr. Tisha Forget or  APP in 6 months   Signed, Francene Ing, Retha Cast, NP 09/04/2023, 7:52 AM [facility]

## 2023-09-05 ENCOUNTER — Ambulatory Visit: Attending: Nurse Practitioner | Admitting: Nurse Practitioner

## 2023-09-05 ENCOUNTER — Encounter: Payer: Self-pay | Admitting: Nurse Practitioner

## 2023-09-05 VITALS — BP 128/68 | HR 60 | Ht 63.0 in | Wt 153.0 lb

## 2023-09-05 DIAGNOSIS — Z720 Tobacco use: Secondary | ICD-10-CM | POA: Diagnosis not present

## 2023-09-05 DIAGNOSIS — I6523 Occlusion and stenosis of bilateral carotid arteries: Secondary | ICD-10-CM

## 2023-09-05 DIAGNOSIS — I2510 Atherosclerotic heart disease of native coronary artery without angina pectoris: Secondary | ICD-10-CM | POA: Diagnosis not present

## 2023-09-05 DIAGNOSIS — I340 Nonrheumatic mitral (valve) insufficiency: Secondary | ICD-10-CM

## 2023-09-05 DIAGNOSIS — E782 Mixed hyperlipidemia: Secondary | ICD-10-CM | POA: Diagnosis not present

## 2023-09-05 DIAGNOSIS — J449 Chronic obstructive pulmonary disease, unspecified: Secondary | ICD-10-CM

## 2023-09-05 MED ORDER — ASPIRIN 81 MG PO TBEC: 81.0000 mg | DELAYED_RELEASE_TABLET | Freq: Every day | ORAL | Status: AC

## 2023-09-05 NOTE — Patient Instructions (Addendum)
 Medication Instructions:  Your physician recommends that you continue on your current medications as directed. Please refer to the Current Medication list given to you today.  START Aspirin 81mg  Take 1 tablet once a day  *If you need a refill on your cardiac medications before your next appointment, please call your pharmacy*  Lab Work: None ordered If you have labs (blood work) drawn today and your tests are completely normal, you will receive your results only by: MyChart Message (if you have MyChart) OR A paper copy in the mail If you have any lab test that is abnormal or we need to change your treatment, we will call you to review the results.  Testing/Procedures: Your physician has requested that you have an echocardiogram. Echocardiography is a painless test that uses sound waves to create images of your heart. It provides your doctor with information about the size and shape of your heart and how well your heart's chambers and valves are working. This procedure takes approximately one hour. There are no restrictions for this procedure. Please do NOT wear cologne, perfume, aftershave, or lotions (deodorant is allowed). Please arrive 15 minutes prior to your appointment time.  Please note: We ask at that you not bring children with you during ultrasound (echo/ vascular) testing. Due to room size and safety concerns, children are not allowed in the ultrasound rooms during exams. Our front office staff cannot provide observation of children in our lobby area while testing is being conducted. An adult accompanying a patient to their appointment will only be allowed in the ultrasound room at the discretion of the ultrasound technician under special circumstances. We apologize for any inconvenience.  Your physician has requested that you have a carotid duplex. This test is an ultrasound of the carotid arteries in your neck. It looks at blood flow through these arteries that supply the brain with  blood. Allow one hour for this exam. There are no restrictions or special instructions.   Follow-Up: At [facility], you and your health needs are our priority.  As part of our continuing mission to provide you with exceptional heart care, our providers are all part of one team.  This team includes your primary Cardiologist (physician) and Advanced Practice Providers or APPs (Physician Assistants and Nurse Practitioners) who all work together to provide you with the care you need, when you need it.  Your next appointment:   6 month(s)  Provider:   Dr Tisha Forget    We recommend signing up for the patient portal called "MyChart".  Sign up information is provided on this After Visit Summary.  MyChart is used to connect with patients for Virtual Visits (Telemedicine).  Patients are able to view lab/test results, encounter notes, upcoming appointments, etc.  Non-urgent messages can be sent to your provider as well.   To learn more about what you can do with MyChart, go to [email or web address].   Other Instructions 1-800-QUIT-NOW SPEAK WITH PCP ABOUT PULMONOLOGIST SPEAK WITH YOUR PCP ABOUT CORONARY CALCIUM  SCORE

## 2023-10-06 ENCOUNTER — Ambulatory Visit (HOSPITAL_COMMUNITY)
Admission: RE | Admit: 2023-10-06 | Discharge: 2023-10-06 | Disposition: A | Discharge status: Home / Self Care | Source: Ambulatory Visit | Attending: Nurse Practitioner | Admitting: Nurse Practitioner

## 2023-10-06 ENCOUNTER — Ambulatory Visit: Payer: Self-pay | Admitting: Nurse Practitioner

## 2023-10-06 DIAGNOSIS — E782 Mixed hyperlipidemia: Secondary | ICD-10-CM | POA: Diagnosis not present

## 2023-10-06 DIAGNOSIS — I6523 Occlusion and stenosis of bilateral carotid arteries: Secondary | ICD-10-CM

## 2023-10-06 DIAGNOSIS — Z720 Tobacco use: Secondary | ICD-10-CM

## 2023-10-06 DIAGNOSIS — I2510 Atherosclerotic heart disease of native coronary artery without angina pectoris: Secondary | ICD-10-CM | POA: Diagnosis not present

## 2023-10-06 LAB — ECHOCARDIOGRAM COMPLETE
AR max vel: 1.95 cm2
AV Area VTI: 1.86 cm2
AV Area mean vel: 1.88 cm2
AV Mean grad: 4 mmHg
AV Peak grad: 7.5 mmHg
Ao pk vel: 1.37 m/s
Area-P 1/2: 3.12 cm2
S' Lateral: 2.39 cm

## 2023-10-24 ENCOUNTER — Other Ambulatory Visit: Payer: Self-pay | Admitting: Physician Assistant

## 2023-10-24 ENCOUNTER — Other Ambulatory Visit: Payer: Self-pay | Admitting: Internal Medicine

## 2023-10-24 ENCOUNTER — Other Ambulatory Visit: Payer: Self-pay | Admitting: Family Medicine

## 2023-10-24 NOTE — Telephone Encounter (Signed)
 Copied from CRM [id number]. Topic: Clinical - Medication Refill >> Oct 24, 2023 12:38 PM Thersia C wrote: Medication: SYNTHROID  75 MCG tablet   Has the patient contacted their pharmacy? Yes (Agent: If no, request that the patient contact the pharmacy for the refill. If patient does not wish to contact the pharmacy document the reason why and proceed with request.) (Agent: If yes, when and what did the pharmacy advise?)  This is the patient's preferred pharmacy:  [phone] [place], [place] - [address] [place] [address] Phone: [phone] Fax: [phone]  Is this the correct pharmacy for this prescription? Yes If no, delete pharmacy and type the correct one.   Has the prescription been filled recently? No  Is the patient out of the medication? Yes  Has the patient been seen for an appointment in the last year OR does the patient have an upcoming appointment? Yes  Can we respond through MyChart? Yes  Agent: Please be advised that Rx refills may take up to 3 business days. We ask that you follow-up with your pharmacy.

## 2023-10-29 ENCOUNTER — Ambulatory Visit: Payer: Medicare Other

## 2023-10-29 VITALS — Ht 63.0 in | Wt 153.0 lb

## 2023-10-29 DIAGNOSIS — Z0000 Encounter for general adult medical examination without abnormal findings: Secondary | ICD-10-CM

## 2023-10-29 NOTE — Progress Notes (Signed)
 Subjective:   Meagan Holmes is a 83 y.o. who presents for a Medicare Wellness preventive visit.  As a reminder, Annual Wellness Visits don't include a physical exam, and some assessments may be limited, especially if this visit is performed virtually. We may recommend an in-person follow-up visit with your provider if needed.  Visit Complete: Virtual I connected with  Meagan Holmes on 10/29/23 by a audio enabled telemedicine application and verified that I am speaking with the correct person using two identifiers.  Patient Location: Home  Provider Location: Office/Clinic  I discussed the limitations of evaluation and management by telemedicine. The patient expressed understanding and agreed to proceed.  Vital Signs: Because this visit was a virtual/telehealth visit, some criteria may be missing or patient reported. Any vitals not documented were not able to be obtained and vitals that have been documented are patient reported.  VideoDeclined- This patient declined Librarian, academic. Therefore the visit was completed with audio only.  Persons Participating in Visit: Patient.  AWV Questionnaire: No: Patient Medicare AWV questionnaire was not completed prior to this visit.  Cardiac Risk Factors include: advanced age (>78men, >13 women);dyslipidemia     Objective:    Today's Vitals   10/29/23 1512  Weight: 153 lb (69.4 kg)  Height: 5' 3 (1.6 m)   Body mass index is 27.1 kg/m.     10/29/2023    3:12 PM 10/23/2022    1:34 PM 06/20/2021   11:23 AM  Advanced Directives  Does Patient Have a Medical Advance Directive? Yes Yes Yes  Type of Estate agent of [place];Living will Healthcare Power of [place];Living will Living will  Does patient want to make changes to medical advance directive?   No - Patient declined  Copy of Healthcare Power of Attorney in Chart? No - copy requested No - copy requested     Current Medications  (verified) Outpatient Encounter Medications as of 10/29/2023  Medication Sig   aspirin  EC 81 MG tablet Take 1 tablet (81 mg total) by mouth daily. Swallow whole.   atorvastatin  (LIPITOR) 80 MG tablet TAKE 1 TABLET BY MOUTH EVERY DAY   Cholecalciferol (VITAMIN D3 PO) Take 2,000 Units by mouth daily.    dicyclomine  (BENTYL ) 10 MG capsule TAKE 1 CAPSULE (10 MG TOTAL) BY MOUTH 3 (THREE) TIMES DAILY AS NEEDED FOR SPASMS.   levocetirizine (XYZAL ) 5 MG tablet TAKE 1 TABLET BY MOUTH EVERY DAY IN THE EVENING   LORazepam  (ATIVAN ) 0.5 MG tablet TAKE 1 TABLET BY MOUTH TWICE A DAY AS NEEDED FOR ANXIETY   Multiple Vitamin (MULTIVITAMIN) tablet Take 1 tablet by mouth daily.   SYNTHROID  75 MCG tablet TAKE 1 TABLET BY MOUTH DAILY BEFORE BREAKFAST.   traMADol  (ULTRAM ) 50 MG tablet Take 1 tablet (50 mg total) by mouth every 6 (six) hours as needed.   citalopram  (CELEXA ) 10 MG tablet TAKE 1 TABLET BY MOUTH EVERY DAY (Patient not taking: Reported on 10/29/2023)   No facility-administered encounter medications on file as of 10/29/2023.    Allergies (verified) Amoxil [amoxicillin]   History: Past Medical History:  Diagnosis Date   Allergic rhinitis    a lot better after septoplasty- remote   Allergic rhinitis 02/10/2007   Qualifier: Diagnosis of  By: Amon MD, Aloysius BRAVO.    Anxiety and depression 02/10/2007   Carotid artery disease (HCC) 05/21/2014   Carotid US  2/23: Bilat ICA 1-39   COPD (chronic obstructive pulmonary disease) (HCC) 10/02/2010   Family history  of early CAD 09/02/2017   Heart murmur 07/06/2014   Hematuria 2004   (-) CT abd-pelvis (-) cystoscopy   Hyperlipidemia 02/17/2009   Qualifier: Diagnosis of  By: Amon MD, Aloysius BRAVO.    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   Osteopenia    Osteoporosis 10/02/2010    DEXA  2008 T score -2.1 DEXA 02-2009 Tscore - 3.3 Started fosamax 2009, switched to later to actonel  to increase compliance    Pre-diabetes 12/17/2017    Triceps tendonitis 12/14/2015   Tricuspid regurgitation 05/31/2021   Echo 2/23: EF 60-65, no RWMA, normal RVSF, normal PASP (RVSP 20.4), trivial MR, mild TR   Past Surgical History:  Procedure Laterality Date   APPENDECTOMY     CARPAL TUNNEL RELEASE     NASAL SEPTUM SURGERY     THYROIDECTOMY     Family History  Problem Relation Age of Onset   Heart attack Father    Diabetes Mother    Hypertension Mother    Colon cancer Other        uncle, age?   Breast cancer Neg Hx    Cancer Neg Hx    Heart disease Neg Hx    Stroke Neg Hx    Social History   Socioeconomic History   Marital status: Widowed    Spouse name: Not on file   Number of children: 3   Years of education: 68   Highest education level: Not on file  Occupational History   Occupation: retired  Tobacco Use   Smoking status: Former    Current packs/day: 0.00    Average packs/day: 0.8 packs/day for 58.0 years (43.5 ttl pk-yrs)    Types: Cigarettes    Start date: 04/29/2012    Quit date: 10/30/2020    Years since quitting: 2.9   Smokeless tobacco: Never   Tobacco comments:    1/2 ppd  Vaping Use   Vaping status: Never Used  Substance and Sexual Activity   Alcohol use: No    Alcohol/week: 0.0 standard drinks of alcohol   Drug use: No   Sexual activity: Not Currently  Other Topics Concern   Not on file  Social History Narrative   Lost son (to drugs) 2008     Husband ill, she takes care of him, raising her g-daughter    Born and raised in [place], [place]. Currently resides in a house with her husband and greatgrandaughter. Maltese dog. Fun: play games with family, gardening, gym   Denies religious beliefs that would effect health care.             Social Drivers of Corporate investment banker Strain: Low Risk  (10/29/2023)   Overall Financial Resource Strain (CARDIA)    Difficulty of Paying Living Expenses: Not hard at all  Food Insecurity: No Food Insecurity (10/29/2023)   Hunger Vital Sign    Worried About  Running Out of Food in the Last Year: Never true    Ran Out of Food in the Last Year: Never true  Transportation Needs: No Transportation Needs (10/29/2023)   PRAPARE - Administrator, Civil Service (Medical): No    Lack of Transportation (Non-Medical): No  Physical Activity: Insufficiently Active (10/29/2023)   Exercise Vital Sign    Days of Exercise per Week: 2 days    Minutes of Exercise per Session: 60 min  Stress: No Stress Concern Present (10/29/2023)   [employer] of Occupational Health - Occupational Stress Questionnaire  Feeling of Stress: Not at all  Social Connections: Moderately Integrated (10/29/2023)   Social Connection and Isolation Panel    Frequency of Communication with Friends and Family: More than three times a week    Frequency of Social Gatherings with Friends and Family: More than three times a week    Attends Religious Services: More than 4 times per year    Active Member of [employer] or Organizations: Yes    Attends Banker Meetings: More than 4 times per year    Marital Status: Widowed    Tobacco Counseling Counseling given: No Tobacco comments: 1/2 ppd    Clinical Intake:  Pre-visit preparation completed: Yes  Pain : No/denies pain     BMI - recorded: 27.1 Nutritional Status: BMI 25 -29 Overweight Nutritional Risks: None Diabetes: No  Lab Results  Component Value Date   HGBA1C 6.0 02/20/2023   HGBA1C 6.1 06/13/2022   HGBA1C 6.3 12/05/2021     How often do you need to have someone help you when you read instructions, pamphlets, or other written materials from your doctor or pharmacy?: 1 - Never  Interpreter Needed?: No  Information entered by :: Meagan Holmes, CMA   Activities of Daily Living     10/29/2023    3:17 PM  In your present state of health, do you have any difficulty performing the following activities:  Hearing? 0  Vision? 0  Difficulty concentrating or making decisions? 0  Walking or climbing  stairs? 0  Dressing or bathing? 0  Doing errands, shopping? 0  Preparing Food and eating ? N  Using the Toilet? N  In the past six months, have you accidently leaked urine? N  Do you have problems with loss of bowel control? N  Managing your Medications? N  Managing your Finances? N  Housekeeping or managing your Housekeeping? N    Patient Care Team: Norleen Lynwood ORN, MD as PCP - General (Internal Medicine) Hobart Powell BRAVO, MD (Inactive) as PCP - Cardiology (Cardiology) Debrah Lamar BIRCH, MD (Inactive) as Consulting Physician (Gastroenterology) Lelon Glendia ONEIDA DEVONNA as Physician Assistant (Cardiology) Lavonia Lye, MD as Consulting Physician (Ophthalmology)  I have updated your Care Teams any recent Medical Services you may have received from other providers in the past year.     Assessment:   This is a routine wellness examination for Meagan Holmes.  Hearing/Vision screen Hearing Screening - Comments:: Denies hearing difficulties   Vision Screening - Comments:: Wears rx glasses - plans to see Dr Milan in 2025   Goals Addressed               This Visit's Progress     Patient Stated (pt-stated)        Patient stated plans to continue being active - does Yoga 2x a week       Depression Screen     10/29/2023    3:19 PM 02/20/2023    1:22 PM 10/23/2022    1:36 PM 06/13/2022   10:46 AM 02/11/2022   11:33 AM 12/05/2021    1:08 PM 06/20/2021   11:35 AM  PHQ 2/9 Scores  PHQ - 2 Score 0 0 0 0 2 2 0  PHQ- 9 Score 0  3 0 11 10     Fall Risk     10/29/2023    3:18 PM 02/20/2023    1:22 PM 10/23/2022    1:35 PM 02/11/2022   11:33 AM 12/05/2021    1:08 PM  Fall Risk  Falls in the past year? 0 0 0 0 0  Number falls in past yr: 0 0 0 0 0  Injury with Fall? 0 0 0 0 0  Risk for fall due to : No Fall Risks No Fall Risks No Fall Risks  No Fall Risks  Follow up Falls evaluation completed;Falls prevention discussed Falls evaluation completed Falls prevention discussed Falls  evaluation completed  Falls evaluation completed      Data saved with a previous flowsheet row definition    MEDICARE RISK AT HOME:  Medicare Risk at Home Any stairs in or around the home?: No If so, are there any without handrails?: No Home free of loose throw rugs in walkways, pet beds, electrical cords, etc?: Yes Adequate lighting in your home to reduce risk of falls?: Yes Life alert?: No Use of a cane, walker or w/c?: No Grab bars in the bathroom?: No Shower chair or bench in shower?: Yes Elevated toilet seat or a handicapped toilet?: No  TIMED UP AND GO:  Was the test performed?  No  Cognitive Function: 6CIT completed        10/29/2023    3:28 PM 10/23/2022    1:44 PM  6CIT Screen  What Year? 0 points 0 points  What month? 0 points 0 points  What time? 0 points 0 points  Count back from 20 0 points 0 points  Months in reverse 0 points 0 points  Repeat phrase 0 points 0 points  Total Score 0 points 0 points    Immunizations Immunization History  Administered Date(s) Administered   Fluad Quad(high Dose 65+) 01/07/2023   Influenza Split 02/13/2011, 01/21/2012, 02/01/2014   Influenza Whole 02/10/2007, 02/17/2008, 02/17/2009, 01/17/2010   Influenza, High Dose Seasonal PF 01/17/2013, 01/15/2022   Influenza,inj,quad, With Preservative 02/10/2019   Influenza-Unspecified 02/06/2015, 01/28/2016, 02/12/2017, 12/30/2017, 02/08/2020   PFIZER Comirnaty(Gray Top)Covid-19 Tri-Sucrose Vaccine 06/12/2019, 07/07/2019, 04/25/2020   Pneumococcal Conjugate-13 04/12/2014, 02/12/2017   Pneumococcal Polysaccharide-23 02/17/2008, 02/01/2014, 12/30/2017   Pneumococcal-Unspecified 12/30/2017   Td 02/10/2007, 07/30/2019   Td (Adult), 2 Lf Tetanus Toxid, Preservative Free 07/30/2019   Tdap 07/09/2018, 07/09/2018   Zoster Recombinant(Shingrix) 11/27/2018    Screening Tests Health Maintenance  Topic Date Due   Zoster Vaccines- Shingrix (2 of 2) 01/22/2019   INFLUENZA VACCINE   11/28/2023   Medicare Annual Wellness (AWV)  10/28/2024   DTaP/Tdap/Td (6 - Td or Tdap) 07/29/2029   Pneumococcal Vaccine: 50+ Years  Completed   DEXA SCAN  Completed   Hepatitis B Vaccines  Aged Out   HPV VACCINES  Aged Out   Meningococcal B Vaccine  Aged Out   COVID-19 Vaccine  Discontinued    Health Maintenance  Health Maintenance Due  Topic Date Due   Zoster Vaccines- Shingrix (2 of 2) 01/22/2019   Health Maintenance Items Addressed:10/29/2023   Additional Screening:  Vision Screening: Recommended annual ophthalmology exams for early detection of glaucoma and other disorders of the eye. Would you like a referral to an eye doctor? No    Dental Screening: Recommended annual dental exams for proper oral hygiene  Community Resource Referral / Chronic Care Management: CRR required this visit?  No   CCM required this visit?  No   Plan:    I have personally reviewed and noted the following in the patient's chart:   Medical and social history Use of alcohol, tobacco or illicit drugs  Current medications and supplements including opioid prescriptions. Patient is not currently taking opioid prescriptions. Functional ability and  status Nutritional status Physical activity Advanced directives List of other physicians Hospitalizations, surgeries, and ER visits in previous 12 months Vitals Screenings to include cognitive, depression, and falls Referrals and appointments  In addition, I have reviewed and discussed with patient certain preventive protocols, quality metrics, and best practice recommendations. A written personalized care plan for preventive services as well as general preventive health recommendations were provided to patient.   Meagan Holmes, CMA   10/29/2023   After Visit Summary: (MyChart) Due to this being a telephonic visit, the after visit summary with patients personalized plan was offered to patient via MyChart   Notes: Nothing significant to report  at this time.

## 2023-10-29 NOTE — Patient Instructions (Addendum)
 Meagan Holmes , Thank you for taking time out of your busy schedule to complete your Annual Wellness Visit with me. I enjoyed our conversation and look forward to speaking with you again next year. I, as well as your care team,  appreciate your ongoing commitment to your health goals. Please review the following plan we discussed and let me know if I can assist you in the future. Your Game plan/ To Do List    Follow up Visits: Next Medicare AWV with our clinical staff: 11/01/2024   Have you seen your provider in the last 6 months (3 months if uncontrolled diabetes)? No Next Office Visit with your provider: no pending appointment  Clinician Recommendations:  Aim for 30 minutes of exercise or brisk walking, 6-8 glasses of water, and 5 servings of fruits and vegetables each day. Educated and advised on getting the 2nd Shingles vaccine in 2025.        This is a list of the screening recommended for you and due dates:  Health Maintenance  Topic Date Due   Zoster (Shingles) Vaccine (2 of 2) 01/22/2019   Flu Shot  11/28/2023   Medicare Annual Wellness Visit  10/28/2024   DTaP/Tdap/Td vaccine (6 - Td or Tdap) 07/29/2029   Pneumococcal Vaccine for age over 76  Completed   DEXA scan (bone density measurement)  Completed   Hepatitis B Vaccine  Aged Out   HPV Vaccine  Aged Out   Meningitis B Vaccine  Aged Out   COVID-19 Vaccine  Discontinued    Advanced directives: (Copy Requested) Please bring a copy of your health care power of attorney and living will to the office to be added to your chart at your convenience. You can mail to [facility] [address] [place], [place] [address] or email to [email or web address] Advance Care Planning is important because it:  [x]  Makes sure you receive the medical care that is consistent with your values, goals, and preferences  [x]  It provides guidance to your family and loved ones and reduces their decisional burden about whether or not  they are making the right decisions based on your wishes.  Follow the link provided in your after visit summary or read over the paperwork we have mailed to you to help you started getting your Advance Directives in place. If you need assistance in completing these, please reach out to us  so that we can help you!

## 2024-01-07 DIAGNOSIS — L821 Other seborrheic keratosis: Secondary | ICD-10-CM | POA: Diagnosis not present

## 2024-01-07 DIAGNOSIS — D1801 Hemangioma of skin and subcutaneous tissue: Secondary | ICD-10-CM | POA: Diagnosis not present

## 2024-01-07 DIAGNOSIS — D692 Other nonthrombocytopenic purpura: Secondary | ICD-10-CM | POA: Diagnosis not present

## 2024-01-07 DIAGNOSIS — D225 Melanocytic nevi of trunk: Secondary | ICD-10-CM | POA: Diagnosis not present

## 2024-01-07 DIAGNOSIS — D485 Neoplasm of uncertain behavior of skin: Secondary | ICD-10-CM | POA: Diagnosis not present

## 2024-01-07 DIAGNOSIS — L853 Xerosis cutis: Secondary | ICD-10-CM | POA: Diagnosis not present

## 2024-01-07 DIAGNOSIS — L820 Inflamed seborrheic keratosis: Secondary | ICD-10-CM | POA: Diagnosis not present

## 2024-01-07 DIAGNOSIS — L720 Epidermal cyst: Secondary | ICD-10-CM | POA: Diagnosis not present

## 2024-01-20 ENCOUNTER — Other Ambulatory Visit: Payer: Self-pay | Admitting: Internal Medicine

## 2024-01-21 NOTE — Telephone Encounter (Unsigned)
 Copied from CRM #[id number]. Topic: Clinical - Medication Question >> Jan 21, 2024  2:31 PM Henretta I wrote: Reason for CRM: Patient was calling in about refill for SYNTHROID  75 MCG tablet, states she only has a couple left, did advise of up to 3 business days but patient doesn't want to run out and wants to know if this can be filled as soon as possible

## 2024-01-23 ENCOUNTER — Telehealth: Payer: Self-pay

## 2024-01-23 ENCOUNTER — Other Ambulatory Visit: Payer: Self-pay | Admitting: Nurse Practitioner

## 2024-01-23 NOTE — Telephone Encounter (Signed)
 Copied from CRM #[id number]. Topic: Clinical - Medication Question >> Jan 21, 2024  2:31 PM Meagan Holmes wrote: Reason for CRM: Patient was calling in about refill for SYNTHROID  75 MCG tablet, states she only has a couple left, did advise of up to 3 business days but patient doesn't want to run out and wants to know if this can be filled as soon as possible >> Jan 23, 2024  9:56 AM Meagan Holmes wrote: Patient has been notified about the approval of her levothyroxine  but the pharmacy is saying they have not received it. The patient took her last dose today and is wanting the script to be resent to the pharmacy. Script should be sent to [facility] on [address].

## 2024-01-23 NOTE — Telephone Encounter (Signed)
 Refill sent yesterday

## 2024-02-17 DIAGNOSIS — Z1231 Encounter for screening mammogram for malignant neoplasm of breast: Secondary | ICD-10-CM | POA: Diagnosis not present

## 2024-02-17 LAB — HM MAMMOGRAPHY

## 2024-02-20 ENCOUNTER — Encounter: Payer: Self-pay | Admitting: Internal Medicine

## 2024-03-10 ENCOUNTER — Ambulatory Visit: Payer: Medicare Other | Admitting: Internal Medicine

## 2024-03-10 ENCOUNTER — Other Ambulatory Visit

## 2024-03-10 ENCOUNTER — Encounter: Payer: Self-pay | Admitting: Internal Medicine

## 2024-03-10 VITALS — BP 144/78 | HR 58 | Ht 63.0 in | Wt 149.0 lb

## 2024-03-10 DIAGNOSIS — Z8639 Personal history of other endocrine, nutritional and metabolic disease: Secondary | ICD-10-CM

## 2024-03-10 DIAGNOSIS — E890 Postprocedural hypothyroidism: Secondary | ICD-10-CM

## 2024-03-10 LAB — TSH: TSH: 1.94 m[IU]/L (ref 0.40–4.50)

## 2024-03-10 LAB — T4, FREE: Free T4: 1.3 ng/dL (ref 0.8–1.8)

## 2024-03-10 NOTE — Progress Notes (Signed)
 Name: Meagan Holmes  MRN/ DOB: [id number], 08-24-1940    Age/ Sex: 83 y.o., female    PCP: Meagan Lynwood ORN, Meagan Holmes   Reason for Endocrinology Evaluation: Meagan Holmes     Date of Initial Endocrinology Evaluation: 08/28/2022    HPI: Meagan Holmes is a 83 y.o. female with a past medical history of Hx of Graves' Holmes, depression. The patient presented for initial endocrinology clinic visit on 08/28/2022 for consultative assistance with her Meagan Holmes.   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   The patient has not been to our clinic in a year.   Patient follows with cardiology for carotid artery stenosis and coronary calcifications as well as dyslipidemia Patient has been noted weight loss over the past year No local neck swelling  No palpitations  No eye symptoms  No tremors  She has been  No Biotin  She does go to the gym twice a week  Levothyroxine  75 mcg, 1 tablet Monday through Saturday and none on Sundays-she has been taking it daily  HISTORY:  Past Medical History:  Past Medical History:  Diagnosis Date   Allergic rhinitis    a lot better after septoplasty- remote   Allergic rhinitis 02/10/2007   Qualifier: Diagnosis of  By: Amon Meagan Holmes, Aloysius BRAVO.    Anxiety and depression 02/10/2007   Carotid artery Holmes 05/21/2014   Carotid US  2/23: Bilat ICA 1-39   COPD (chronic obstructive pulmonary Holmes) (HCC) 10/02/2010   Family history of early CAD 09/02/2017   Heart murmur 07/06/2014   Hematuria 2004   (-) CT abd-pelvis (-) cystoscopy   Hyperlipidemia 02/17/2009   Qualifier: Diagnosis of  By: Amon Meagan Holmes, Aloysius BRAVO.    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 Her smoking use included cigarettes. She started smoking about 11 years ago. She has a 43.5 pack-year smoking history. She has never used smokeless tobacco. She reports that she does not drink alcohol and does not use drugs. Family History: family history includes Colon cancer in an other family member; Diabetes in her mother; Heart attack in her father; Hypertension in her mother.   HOME MEDICATIONS: Allergies as of 03/10/2024       Reactions   Amoxil [amoxicillin] Hives        Medication List        Accurate as of March 10, 2024 11:03 AM. If you have any questions, ask your nurse or doctor.          STOP taking these medications    citalopram  10 MG  tablet Commonly known as: CELEXA  Stopped by: Meagan Holmes       TAKE these medications    aspirin  EC 81 MG tablet Take 1 tablet (81 mg total) by mouth daily. Swallow whole.   atorvastatin  80 MG tablet Commonly known as: LIPITOR TAKE 1 TABLET BY MOUTH EVERY DAY   dicyclomine  10 MG capsule Commonly known as: BENTYL  TAKE 1 CAPSULE (10 MG TOTAL) BY MOUTH 3 (THREE) TIMES DAILY AS NEEDED FOR SPASMS.   levocetirizine 5 MG tablet Commonly known as: XYZAL  TAKE 1 TABLET BY MOUTH EVERY DAY IN THE EVENING   LORazepam  0.5 MG tablet Commonly known as: ATIVAN  TAKE 1 TABLET BY MOUTH TWICE A DAY AS NEEDED FOR ANXIETY   multivitamin tablet Take 1 tablet by mouth daily.   Synthroid  75 MCG  tablet Generic drug: levothyroxine  TAKE 1 TABLET BY MOUTH DAILY BEFORE BREAKFAST.   traMADol  50 MG tablet Commonly known as: ULTRAM  Take 1 tablet (50 mg total) by mouth every 6 (six) hours as needed.   VITAMIN D3 PO Take 2,000 Units by mouth daily.          REVIEW OF SYSTEMS: A comprehensive ROS was conducted with the patient and is negative except as per HPI   OBJECTIVE:  VS: BP (!) 144/78   Pulse (!) 58   Ht 5' 3 (1.6 m)   Wt 149 lb (67.6 kg)   SpO2 97%   BMI 26.39 kg/m    Wt Readings from Last 3 Encounters:  03/10/24 149 lb (67.6 kg)  10/29/23 153 lb (69.4 kg)  09/05/23 153 lb (69.4 kg)     EXAM: General: Pt appears well and is in NAD  Eyes: External eye exam normal without stare, lid lag or exophthalmos.  EOM intact.   Neck: General: Supple without adenopathy. Thyroid : Thyroid  size normal.  No goiter or nodules appreciated.  Lungs: Clear with good BS bilat   Heart: Auscultation: RRR.  Extremities:  BL LE: No pretibial edema normal   Mental Status: Judgment, insight: Intact Orientation: Oriented to time, place, and person Mood and affect: No depression, anxiety, or agitation     DATA REVIEWED:     Latest Reference Range & Units 03/10/24 11:23  TSH 0.40 - 4.50 mIU/L 1.94  T4,Free(Direct) 0.8 - 1.8 ng/dL 1.3    ASSESSMENT/PLAN/RECOMMENDATIONS:   Postoperative hypothyroid:  -She is s/p thyroidectomy secondary to Graves' Holmes -Patient is clinically euthyroid -She has not decreased Synthroid  as previously prescribed, we did discuss the risk of fatigue, cardiac arrhythmia with iatrogenic hyperthyroid -Repeat TFTs today are normal, will continue current dose    Medications : Continue Synthroid  75 mcg , 1 tablet daily   Follow-up in 6 months   Signed electronically by: Meagan Holmes Butts, Meagan Holmes  [facility] Endocrinology  [facility] [address] [place] [place], [place] [address] Phone: [phone] FAX:  [phone]   CC: Meagan Lynwood ORN, Meagan Holmes [address] [place] [address] Phone: [phone] Fax: [phone]   Return to Endocrinology clinic as below: Future Appointments  Date Time Provider Department Center  03/10/2024 11:10 AM Meagan Holmes, Meagan Redgie, Meagan Holmes [facility] None  03/16/2024 11:00 AM Meagan Lynwood ORN, Meagan Holmes [facility]  11/01/2024  2:30 PM [facility] WELLNESS VISIT [facility]

## 2024-03-11 ENCOUNTER — Ambulatory Visit: Payer: Self-pay | Admitting: Internal Medicine

## 2024-03-16 ENCOUNTER — Ambulatory Visit: Payer: Self-pay | Admitting: Internal Medicine

## 2024-03-16 ENCOUNTER — Encounter: Payer: Self-pay | Admitting: Internal Medicine

## 2024-03-16 ENCOUNTER — Ambulatory Visit: Admitting: Internal Medicine

## 2024-03-16 VITALS — BP 122/78 | HR 59 | Temp 98.6°F | Ht 63.0 in | Wt 150.0 lb

## 2024-03-16 DIAGNOSIS — Z0000 Encounter for general adult medical examination without abnormal findings: Secondary | ICD-10-CM | POA: Diagnosis not present

## 2024-03-16 DIAGNOSIS — E782 Mixed hyperlipidemia: Secondary | ICD-10-CM | POA: Diagnosis not present

## 2024-03-16 DIAGNOSIS — F419 Anxiety disorder, unspecified: Secondary | ICD-10-CM

## 2024-03-16 DIAGNOSIS — E559 Vitamin D deficiency, unspecified: Secondary | ICD-10-CM

## 2024-03-16 DIAGNOSIS — E039 Hypothyroidism, unspecified: Secondary | ICD-10-CM

## 2024-03-16 DIAGNOSIS — N3281 Overactive bladder: Secondary | ICD-10-CM

## 2024-03-16 DIAGNOSIS — E538 Deficiency of other specified B group vitamins: Secondary | ICD-10-CM | POA: Diagnosis not present

## 2024-03-16 DIAGNOSIS — R7303 Prediabetes: Secondary | ICD-10-CM

## 2024-03-16 DIAGNOSIS — Z0001 Encounter for general adult medical examination with abnormal findings: Secondary | ICD-10-CM

## 2024-03-16 DIAGNOSIS — F32A Depression, unspecified: Secondary | ICD-10-CM

## 2024-03-16 LAB — LIPID PANEL
Cholesterol: 132 mg/dL (ref 0–200)
HDL: 43.8 mg/dL (ref >39.00)
LDL Cholesterol: 70 mg/dL (ref 0–99)
NonHDL: 88.28
Total CHOL/HDL Ratio: 3
Triglycerides: 92 mg/dL (ref 0.0–149.0)
VLDL: 18.4 mg/dL (ref 0.0–40.0)

## 2024-03-16 LAB — BASIC METABOLIC PANEL WITH GFR
BUN: 11 mg/dL (ref 6–23)
CO2: 27 meq/L (ref 19–32)
Calcium: 9.7 mg/dL (ref 8.4–10.5)
Chloride: 105 meq/L (ref 96–112)
Creatinine, Ser: 0.7 mg/dL (ref 0.40–1.20)
GFR: 79.81 mL/min (ref >60.00)
Glucose, Bld: 99 mg/dL (ref 70–99)
Potassium: 4.9 meq/L (ref 3.5–5.1)
Sodium: 142 meq/L (ref 135–145)

## 2024-03-16 LAB — URINALYSIS, ROUTINE W REFLEX MICROSCOPIC
Bilirubin Urine: NEGATIVE
Hgb urine dipstick: NEGATIVE
Ketones, ur: NEGATIVE
Nitrite: NEGATIVE
Specific Gravity, Urine: 1.01 (ref 1.000–1.030)
Total Protein, Urine: NEGATIVE
Urine Glucose: NEGATIVE
Urobilinogen, UA: 0.2 (ref 0.0–1.0)
pH: 6 (ref 5.0–8.0)

## 2024-03-16 LAB — CBC WITH DIFFERENTIAL/PLATELET
Basophils Absolute: 0 K/uL (ref 0.0–0.1)
Basophils Relative: 0.7 % (ref 0.0–3.0)
Eosinophils Absolute: 0.2 K/uL (ref 0.0–0.7)
Eosinophils Relative: 2.9 % (ref 0.0–5.0)
HCT: 40.8 % (ref 36.0–46.0)
Hemoglobin: 13.8 g/dL (ref 12.0–15.0)
Lymphocytes Relative: 22 % (ref 12.0–46.0)
Lymphs Abs: 1.3 K/uL (ref 0.7–4.0)
MCHC: 33.8 g/dL (ref 30.0–36.0)
MCV: 90.5 fl (ref 78.0–100.0)
Monocytes Absolute: 0.5 K/uL (ref 0.1–1.0)
Monocytes Relative: 7.3 % (ref 3.0–12.0)
Neutro Abs: 4.1 K/uL (ref 1.4–7.7)
Neutrophils Relative %: 67.1 % (ref 43.0–77.0)
Platelets: 208 K/uL (ref 150.0–400.0)
RBC: 4.51 Mil/uL (ref 3.87–5.11)
RDW: 13.7 % (ref 11.5–15.5)
WBC: 6.1 K/uL (ref 4.0–10.5)

## 2024-03-16 LAB — HEPATIC FUNCTION PANEL
ALT: 23 U/L (ref 0–35)
AST: 26 U/L (ref 0–37)
Albumin: 4.4 g/dL (ref 3.5–5.2)
Alkaline Phosphatase: 39 U/L (ref 39–117)
Bilirubin, Direct: 0.1 mg/dL (ref 0.0–0.3)
Total Bilirubin: 0.6 mg/dL (ref 0.2–1.2)
Total Protein: 7.5 g/dL (ref 6.0–8.3)

## 2024-03-16 LAB — VITAMIN B12: Vitamin B-12: 733 pg/mL (ref 211–911)

## 2024-03-16 LAB — VITAMIN D 25 HYDROXY (VIT D DEFICIENCY, FRACTURES): VITD: 42.53 ng/mL (ref 30.00–100.00)

## 2024-03-16 LAB — TSH: TSH: 1.42 u[IU]/mL (ref 0.35–5.50)

## 2024-03-16 LAB — HEMOGLOBIN A1C: Hgb A1c MFr Bld: 6.2 % (ref 4.6–6.5)

## 2024-03-16 MED ORDER — LORAZEPAM 0.5 MG PO TABS: ORAL_TABLET | ORAL | 1 refills | Status: AC

## 2024-03-16 MED ORDER — SOLIFENACIN SUCCINATE 5 MG PO TABS: 5.0000 mg | ORAL_TABLET | Freq: Every day | ORAL | 3 refills | Status: AC

## 2024-03-16 NOTE — Progress Notes (Signed)
 Patient ID: Meagan Holmes, female   DOB: 02-Nov-1940, 83 y.o.   MRN: [id number]         Chief Complaint:: wellness exam and OAB, anxiety depression, preDM, hld       HPI:  Meagan Holmes is a 83 y.o. female here for wellness exam; up to date; goes to gym twice per wk               Also Pt denies chest pain, increased sob or doe, wheezing, orthopnea, PND, increased LE swelling, palpitations, dizziness or syncope.   Pt denies polydipsia, polyuria, or new focal neuro s/s.   Denies worsening depressive symptoms, suicidal ideation, or panic; has ongoing anxiety, asks for ativan  refill for only very infrequent use.  Does have significant worsening urinary frequency, but Denies urinary symptoms such as dysuria, urgency, flank pain, hematuria or n/v, fever, chills.     Wt Readings from Last 3 Encounters:  03/16/24 150 lb (68 kg)  03/10/24 149 lb (67.6 kg)  10/29/23 153 lb (69.4 kg)   BP Readings from Last 3 Encounters:  03/16/24 122/78  03/10/24 (!) 144/78  09/05/23 128/68   Immunization History  Administered Date(s) Administered   Fluad Quad(high Dose 65+) 01/07/2023   INFLUENZA, HIGH DOSE SEASONAL PF 01/17/2013, 01/15/2022, 12/31/2023   Influenza Split 02/13/2011, 01/21/2012, 02/01/2014   Influenza Whole 02/10/2007, 02/17/2008, 02/17/2009, 01/17/2010   Influenza,inj,quad, With Preservative 02/10/2019   Influenza-Unspecified 02/06/2015, 01/28/2016, 02/12/2017, 12/30/2017, 02/08/2020   PFIZER Comirnaty(Gray Top)Covid-19 Tri-Sucrose Vaccine 06/12/2019, 07/07/2019, 04/25/2020   Pneumococcal Conjugate-13 04/12/2014, 02/12/2017   Pneumococcal Polysaccharide-23 02/17/2008, 02/01/2014, 12/30/2017   Pneumococcal-Unspecified 12/30/2017   Td 02/10/2007, 07/30/2019   Td (Adult), 2 Lf Tetanus Toxid, Preservative Free 07/30/2019   Tdap 07/09/2018, 07/09/2018   Zoster Recombinant(Shingrix) 11/27/2018, 02/08/2019  There are no preventive care reminders to display for this patient.    Past Medical  History:  Diagnosis Date   Allergic rhinitis    a lot better after septoplasty- remote   Allergic rhinitis 02/10/2007   Qualifier: Diagnosis of  By: Amon MD, Aloysius BRAVO.    Anxiety and depression 02/10/2007   Carotid artery disease 05/21/2014   Carotid US  2/23: Bilat ICA 1-39   COPD (chronic obstructive pulmonary disease) (HCC) 10/02/2010   Family history of early CAD 09/02/2017   Heart murmur 07/06/2014   Hematuria 2004   (-) CT abd-pelvis (-) cystoscopy   Hyperlipidemia 02/17/2009   Qualifier: Diagnosis of  By: Amon MD, Aloysius BRAVO.    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   Osteopenia    Osteoporosis 10/02/2010    DEXA  2008 T score -2.1 DEXA 02-2009 Tscore - 3.3 Started fosamax 2009, switched to later to actonel  to increase compliance    Pre-diabetes 12/17/2017   Triceps tendonitis 12/14/2015   Tricuspid regurgitation 05/31/2021   Echo 2/23: EF 60-65, no RWMA, normal RVSF, normal PASP (RVSP 20.4), trivial MR, mild TR   Past Surgical History:  Procedure Laterality Date   APPENDECTOMY     CARPAL TUNNEL RELEASE     NASAL SEPTUM SURGERY     THYROIDECTOMY      reports that she quit smoking about 3 years ago. Her smoking use included cigarettes. She started smoking about 11 years ago. She has a 43.5 pack-year smoking history. She has never used smokeless tobacco. She reports that she does not drink alcohol and does not use drugs. family history includes Colon cancer in an other family member; Diabetes in her  mother; Heart attack in her father; Hypertension in her mother. No Active Allergies Current Outpatient Medications on File Prior to Visit  Medication Sig Dispense Refill   aspirin  EC 81 MG tablet Take 1 tablet (81 mg total) by mouth daily. Swallow whole.     atorvastatin  (LIPITOR) 80 MG tablet TAKE 1 TABLET BY MOUTH EVERY DAY 90 tablet 3   Cholecalciferol (VITAMIN D3 PO) Take 2,000 Units by mouth daily.      dicyclomine  (BENTYL ) 10 MG capsule  TAKE 1 CAPSULE (10 MG TOTAL) BY MOUTH 3 (THREE) TIMES DAILY AS NEEDED FOR SPASMS. 270 capsule 1   levocetirizine (XYZAL ) 5 MG tablet TAKE 1 TABLET BY MOUTH EVERY DAY IN THE EVENING 90 tablet 3   Multiple Vitamin (MULTIVITAMIN) tablet Take 1 tablet by mouth daily.     SYNTHROID  75 MCG tablet TAKE 1 TABLET BY MOUTH DAILY BEFORE BREAKFAST. 90 tablet 0   traMADol  (ULTRAM ) 50 MG tablet Take 1 tablet (50 mg total) by mouth every 6 (six) hours as needed. 30 tablet 0   No current facility-administered medications on file prior to visit.        ROS:  All others reviewed and negative.  Objective        PE:  BP 122/78 (BP Location: Right Arm, Patient Position: Sitting, Cuff Size: Normal)   Pulse (!) 59   Temp 98.6 F (37 C) (Oral)   Ht 5' 3 (1.6 m)   Wt 150 lb (68 kg)   SpO2 98%   BMI 26.57 kg/m                 Constitutional: Pt appears in NAD               HENT: Head: NCAT.                Right Ear: External ear normal.                 Left Ear: External ear normal.                Eyes: . Pupils are equal, round, and reactive to light. Conjunctivae and EOM are normal               Nose: without d/c or deformity               Neck: Neck supple. Gross normal ROM               Cardiovascular: Normal rate and regular rhythm.                 Pulmonary/Chest: Effort normal and breath sounds without rales or wheezing.                Abd:  Soft, NT, ND, + BS, no organomegaly               Neurological: Pt is alert. At baseline orientation, motor grossly intact               Skin: Skin is warm. No rashes, no other new lesions, LE edema - none               Psychiatric: Pt behavior is normal without agitation , mild nervous  Micro: none  Cardiac tracings I have personally interpreted today:  none  Pertinent Radiological findings (summarize): none   Lab Results  Component Value Date   WBC 5.3 02/20/2023   HGB 13.7 02/20/2023   HCT 42.1 02/20/2023  PLT 226.0 02/20/2023   GLUCOSE 110 (H)  02/20/2023   CHOL 123 06/13/2022   TRIG 65.0 06/13/2022   HDL 43.80 06/13/2022   LDLDIRECT 171.2 03/19/2012   LDLCALC 67 06/13/2022   ALT 30 02/20/2023   AST 29 02/20/2023   NA 142 02/20/2023   K 5.2 No hemolysis seen (H) 02/20/2023   CL 105 02/20/2023   CREATININE 0.79 02/20/2023   BUN 13 02/20/2023   CO2 31 02/20/2023   TSH 1.94 03/10/2024   HGBA1C 6.0 02/20/2023   Assessment/Plan:  DAYLYN CHRISTINE is a 83 y.o. White or Caucasian [1] female with  has a past medical history of Allergic rhinitis, Allergic rhinitis (02/10/2007), Anxiety and depression (02/10/2007), Carotid artery disease (05/21/2014), COPD (chronic obstructive pulmonary disease) (HCC) (10/02/2010), Family history of early CAD (09/02/2017), Heart murmur (07/06/2014), Hematuria (2004), Hyperlipidemia (02/17/2009), Hypothyroidism, Nicotine dependence with nicotine-induced disorder (06/19/2017), Osteopenia, Osteoporosis (10/02/2010), Pre-diabetes (12/17/2017), Triceps tendonitis (12/14/2015), and Tricuspid regurgitation (05/31/2021).  Encounter for well adult exam with abnormal findings Age and sex appropriate education and counseling updated with regular exercise and diet Referrals for preventative services - none needed Immunizations addressed - none needed Smoking counseling  - none needed Evidence for depression or other mood disorder - none significant Most recent labs reviewed. I have personally reviewed and have noted: 1) the patient's medical and social history 2) The patient's current medications and supplements 3) The patient's height, weight, and BMI have been recorded in the chart   Pre-diabetes Lab Results  Component Value Date   HGBA1C 6.0 02/20/2023   Stable, pt to continue current medical treatment  - diet, wt control   OAB (overactive bladder) With worsening symptoms lifestyle limiting - asks to restart vesicare 5 mg every day as this helped previously  Hypothyroidism Lab Results  Component  Value Date   TSH 1.94 03/10/2024   Stable, pt to continue levothyroxine  75 mcg qd   Hyperlipidemia Lab Results  Component Value Date   LDLCALC 67 06/13/2022   Stable, pt to continue current statin  - diet, wt control   Anxiety and depression Stable but chronic persistent intermittent anxiety - for ativan  refill limited rx  Followup: Return in about 1 year (around 03/16/2025).  Lynwood Rush, MD 03/16/2024 12:04 PM [facility]

## 2024-03-16 NOTE — Assessment & Plan Note (Signed)
 Lab Results  Component Value Date   TSH 1.94 03/10/2024   Stable, pt to continue levothyroxine  75 mcg qd

## 2024-03-16 NOTE — Assessment & Plan Note (Signed)
 Stable but chronic persistent intermittent anxiety - for ativan  refill limited rx

## 2024-03-16 NOTE — Assessment & Plan Note (Signed)

## 2024-03-16 NOTE — Patient Instructions (Addendum)
 Please take all new medication as prescribed - the generic for Vesicare 5 mg  per day  Please continue all other medications as before, and refills have been done for the ativan   Please have the pharmacy call with any other refills you may need.  Please continue your efforts at being more active, low cholesterol diet, and weight control.  You are otherwise up to date with prevention measures today.  Please keep your appointments with your specialists as you may have planned  Please go to the LAB at the blood drawing area for the tests to be done  You will be contacted by phone if any changes need to be made immediately.  Otherwise, you will receive a letter about your results with an explanation, but please check with MyChart first.  Please make an Appointment to return for your 1 year visit, or sooner if needed

## 2024-03-16 NOTE — Progress Notes (Signed)
 The test results show that your current treatment is OK, as the tests are stable.  Please continue the same plan.  There is no other need for change of treatment or further evaluation based on these results, at this time.  thanks

## 2024-03-16 NOTE — Assessment & Plan Note (Addendum)
 With worsening symptoms lifestyle limiting - asks to restart vesicare 5 mg every day as this helped previously

## 2024-03-16 NOTE — Assessment & Plan Note (Signed)
 Lab Results  Component Value Date   LDLCALC 67 06/13/2022   Stable, pt to continue current statin  - diet, wt control

## 2024-03-16 NOTE — Assessment & Plan Note (Signed)
 Lab Results  Component Value Date   HGBA1C 6.0 02/20/2023   Stable, pt to continue current medical treatment  - diet, wt control

## 2024-04-13 ENCOUNTER — Other Ambulatory Visit: Payer: Self-pay | Admitting: Internal Medicine

## 2024-04-13 ENCOUNTER — Other Ambulatory Visit: Payer: Self-pay

## 2024-09-08 ENCOUNTER — Ambulatory Visit: Admitting: Internal Medicine

## 2024-11-01 ENCOUNTER — Ambulatory Visit
